# Patient Record
Sex: Male | Born: 1940 | Race: White | Hispanic: No | Marital: Married | Smoking: Former smoker
Health system: Southern US, Community
[De-identification: ages and names within clinical notes are randomized; demographics above are authoritative.]

## PROBLEM LIST (undated history)

## (undated) DIAGNOSIS — M1990 Unspecified osteoarthritis, unspecified site: Secondary | ICD-10-CM

## (undated) DIAGNOSIS — Z800 Family history of malignant neoplasm of digestive organs: Secondary | ICD-10-CM

## (undated) DIAGNOSIS — E785 Hyperlipidemia, unspecified: Secondary | ICD-10-CM

## (undated) DIAGNOSIS — N200 Calculus of kidney: Secondary | ICD-10-CM

## (undated) DIAGNOSIS — I493 Ventricular premature depolarization: Secondary | ICD-10-CM

## (undated) DIAGNOSIS — J45909 Unspecified asthma, uncomplicated: Secondary | ICD-10-CM

## (undated) DIAGNOSIS — D751 Secondary polycythemia: Secondary | ICD-10-CM

## (undated) DIAGNOSIS — Z951 Presence of aortocoronary bypass graft: Secondary | ICD-10-CM

## (undated) DIAGNOSIS — I2510 Atherosclerotic heart disease of native coronary artery without angina pectoris: Secondary | ICD-10-CM

## (undated) DIAGNOSIS — R55 Syncope and collapse: Secondary | ICD-10-CM

## (undated) DIAGNOSIS — I25810 Atherosclerosis of coronary artery bypass graft(s) without angina pectoris: Secondary | ICD-10-CM

## (undated) DIAGNOSIS — G8929 Other chronic pain: Secondary | ICD-10-CM

## (undated) DIAGNOSIS — Z950 Presence of cardiac pacemaker: Secondary | ICD-10-CM

## (undated) DIAGNOSIS — I219 Acute myocardial infarction, unspecified: Secondary | ICD-10-CM

## (undated) DIAGNOSIS — K5790 Diverticulosis of intestine, part unspecified, without perforation or abscess without bleeding: Secondary | ICD-10-CM

## (undated) DIAGNOSIS — I10 Essential (primary) hypertension: Secondary | ICD-10-CM

## (undated) DIAGNOSIS — I672 Cerebral atherosclerosis: Secondary | ICD-10-CM

## (undated) DIAGNOSIS — I420 Dilated cardiomyopathy: Secondary | ICD-10-CM

## (undated) DIAGNOSIS — M549 Dorsalgia, unspecified: Secondary | ICD-10-CM

## (undated) DIAGNOSIS — Z9861 Coronary angioplasty status: Secondary | ICD-10-CM

## (undated) DIAGNOSIS — K219 Gastro-esophageal reflux disease without esophagitis: Secondary | ICD-10-CM

## (undated) DIAGNOSIS — N529 Male erectile dysfunction, unspecified: Secondary | ICD-10-CM

## (undated) HISTORY — DX: Coronary angioplasty status: Z98.61

## (undated) HISTORY — DX: Essential (primary) hypertension: I10

## (undated) HISTORY — DX: Ventricular premature depolarization: I49.3

## (undated) HISTORY — DX: Secondary polycythemia: D75.1

## (undated) HISTORY — DX: Atherosclerosis of coronary artery bypass graft(s) without angina pectoris: I25.810

## (undated) HISTORY — PX: CORONARY ANGIOPLASTY WITH STENT PLACEMENT: SHX49

## (undated) HISTORY — PX: CATARACT EXTRACTION W/ INTRAOCULAR LENS  IMPLANT, BILATERAL: SHX1307

## (undated) HISTORY — PX: TRANSTHORACIC ECHOCARDIOGRAM: SHX275

## (undated) HISTORY — DX: Unspecified asthma, uncomplicated: J45.909

## (undated) HISTORY — DX: Atherosclerotic heart disease of native coronary artery without angina pectoris: I25.10

## (undated) HISTORY — DX: Family history of malignant neoplasm of digestive organs: Z80.0

## (undated) HISTORY — DX: Cerebral atherosclerosis: I67.2

## (undated) HISTORY — DX: Syncope and collapse: R55

## (undated) HISTORY — DX: Diverticulosis of intestine, part unspecified, without perforation or abscess without bleeding: K57.90

## (undated) HISTORY — DX: Gastro-esophageal reflux disease without esophagitis: K21.9

## (undated) HISTORY — PX: TRANSURETHRAL RESECTION OF PROSTATE: SHX73

## (undated) HISTORY — DX: Male erectile dysfunction, unspecified: N52.9

## (undated) HISTORY — DX: Hyperlipidemia, unspecified: E78.5

## (undated) HISTORY — DX: Presence of aortocoronary bypass graft: Z95.1

---
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
## 2000-04-21 ENCOUNTER — Encounter: Payer: Self-pay | Admitting: Pulmonary Disease

## 2000-07-21 ENCOUNTER — Other Ambulatory Visit: Admission: RE | Admit: 2000-07-21 | Discharge: 2000-07-21 | Payer: Self-pay | Admitting: *Deleted

## 2000-07-21 ENCOUNTER — Encounter (INDEPENDENT_AMBULATORY_CARE_PROVIDER_SITE_OTHER): Payer: Self-pay | Admitting: Specialist

## 2001-01-11 ENCOUNTER — Encounter: Payer: Self-pay | Admitting: Pulmonary Disease

## 2001-02-27 ENCOUNTER — Encounter: Payer: Self-pay | Admitting: Pulmonary Disease

## 2001-04-16 ENCOUNTER — Encounter: Payer: Self-pay | Admitting: Pulmonary Disease

## 2001-04-25 ENCOUNTER — Ambulatory Visit (HOSPITAL_COMMUNITY): Admission: RE | Admit: 2001-04-25 | Discharge: 2001-04-25 | Payer: Self-pay | Admitting: Family Medicine

## 2001-04-25 ENCOUNTER — Encounter: Payer: Self-pay | Admitting: Family Medicine

## 2001-05-11 ENCOUNTER — Encounter: Payer: Self-pay | Admitting: Pulmonary Disease

## 2001-10-12 ENCOUNTER — Inpatient Hospital Stay (HOSPITAL_COMMUNITY): Admission: RE | Admit: 2001-10-12 | Discharge: 2001-10-14 | Payer: Self-pay | Admitting: Urology

## 2001-10-12 ENCOUNTER — Encounter (INDEPENDENT_AMBULATORY_CARE_PROVIDER_SITE_OTHER): Payer: Self-pay | Admitting: Specialist

## 2002-11-01 ENCOUNTER — Encounter (INDEPENDENT_AMBULATORY_CARE_PROVIDER_SITE_OTHER): Payer: Self-pay | Admitting: *Deleted

## 2002-11-01 ENCOUNTER — Emergency Department (HOSPITAL_COMMUNITY): Admission: EM | Admit: 2002-11-01 | Discharge: 2002-11-01 | Payer: Self-pay

## 2003-09-06 DIAGNOSIS — I219 Acute myocardial infarction, unspecified: Secondary | ICD-10-CM

## 2003-09-06 HISTORY — DX: Acute myocardial infarction, unspecified: I21.9

## 2004-07-06 DIAGNOSIS — Z951 Presence of aortocoronary bypass graft: Secondary | ICD-10-CM

## 2004-07-06 HISTORY — DX: Presence of aortocoronary bypass graft: Z95.1

## 2004-07-15 HISTORY — PX: CARDIAC CATHETERIZATION: SHX172

## 2004-07-23 ENCOUNTER — Inpatient Hospital Stay (HOSPITAL_COMMUNITY)
Admission: RE | Admit: 2004-07-23 | Discharge: 2004-07-28 | Payer: Self-pay | Admitting: Thoracic Surgery (Cardiothoracic Vascular Surgery)

## 2004-07-23 HISTORY — PX: CORONARY ARTERY BYPASS GRAFT: SHX141

## 2004-08-23 ENCOUNTER — Encounter
Admission: RE | Admit: 2004-08-23 | Discharge: 2004-08-23 | Payer: Self-pay | Admitting: Thoracic Surgery (Cardiothoracic Vascular Surgery)

## 2004-09-08 ENCOUNTER — Encounter (HOSPITAL_COMMUNITY): Admission: RE | Admit: 2004-09-08 | Discharge: 2004-10-08 | Payer: Self-pay | Admitting: Cardiovascular Disease

## 2004-09-23 ENCOUNTER — Ambulatory Visit: Payer: Self-pay | Admitting: Pulmonary Disease

## 2004-10-11 ENCOUNTER — Encounter (HOSPITAL_COMMUNITY): Admission: RE | Admit: 2004-10-11 | Discharge: 2004-11-10 | Payer: Self-pay | Admitting: Cardiovascular Disease

## 2005-03-14 ENCOUNTER — Encounter: Admission: RE | Admit: 2005-03-14 | Discharge: 2005-03-14 | Payer: Self-pay

## 2006-04-05 ENCOUNTER — Ambulatory Visit: Payer: Self-pay | Admitting: Pulmonary Disease

## 2006-05-15 ENCOUNTER — Ambulatory Visit: Payer: Self-pay | Admitting: Internal Medicine

## 2006-05-30 ENCOUNTER — Ambulatory Visit: Payer: Self-pay | Admitting: Internal Medicine

## 2006-07-25 ENCOUNTER — Encounter: Admission: RE | Admit: 2006-07-25 | Discharge: 2006-07-25 | Payer: Self-pay | Admitting: Cardiovascular Disease

## 2008-07-09 DIAGNOSIS — N401 Enlarged prostate with lower urinary tract symptoms: Secondary | ICD-10-CM

## 2008-07-09 DIAGNOSIS — J309 Allergic rhinitis, unspecified: Secondary | ICD-10-CM | POA: Insufficient documentation

## 2008-07-09 DIAGNOSIS — G4733 Obstructive sleep apnea (adult) (pediatric): Secondary | ICD-10-CM | POA: Insufficient documentation

## 2008-07-09 DIAGNOSIS — N138 Other obstructive and reflux uropathy: Secondary | ICD-10-CM | POA: Insufficient documentation

## 2008-07-10 ENCOUNTER — Ambulatory Visit: Payer: Self-pay | Admitting: Pulmonary Disease

## 2008-09-05 DIAGNOSIS — R55 Syncope and collapse: Secondary | ICD-10-CM

## 2008-09-05 HISTORY — DX: Syncope and collapse: R55

## 2009-07-15 ENCOUNTER — Ambulatory Visit: Payer: Self-pay | Admitting: Pulmonary Disease

## 2009-08-12 ENCOUNTER — Ambulatory Visit: Payer: Self-pay | Admitting: Internal Medicine

## 2009-08-12 DIAGNOSIS — K219 Gastro-esophageal reflux disease without esophagitis: Secondary | ICD-10-CM

## 2009-08-12 DIAGNOSIS — R1310 Dysphagia, unspecified: Secondary | ICD-10-CM | POA: Insufficient documentation

## 2009-08-21 ENCOUNTER — Ambulatory Visit: Payer: Self-pay | Admitting: Internal Medicine

## 2009-08-25 ENCOUNTER — Ambulatory Visit: Payer: Self-pay | Admitting: Internal Medicine

## 2009-08-25 LAB — CONVERTED CEMR LAB: UREASE: POSITIVE

## 2009-09-28 ENCOUNTER — Ambulatory Visit: Payer: Self-pay | Admitting: Internal Medicine

## 2009-09-28 DIAGNOSIS — K2980 Duodenitis without bleeding: Secondary | ICD-10-CM | POA: Insufficient documentation

## 2010-07-14 ENCOUNTER — Ambulatory Visit: Payer: Self-pay | Admitting: Pulmonary Disease

## 2010-07-14 DIAGNOSIS — J45901 Unspecified asthma with (acute) exacerbation: Secondary | ICD-10-CM

## 2010-08-04 ENCOUNTER — Ambulatory Visit: Payer: Self-pay | Admitting: Oncology

## 2010-08-18 ENCOUNTER — Encounter: Payer: Self-pay | Admitting: Pulmonary Disease

## 2010-08-18 LAB — CBC WITH DIFFERENTIAL/PLATELET
BASO%: 0.3 % (ref 0.0–2.0)
EOS%: 4.4 % (ref 0.0–7.0)
HCT: 52.2 % — ABNORMAL HIGH (ref 38.4–49.9)
MCH: 29.5 pg (ref 27.2–33.4)
MCHC: 33.3 g/dL (ref 32.0–36.0)
MCV: 88.8 fL (ref 79.3–98.0)
MONO%: 6.5 % (ref 0.0–14.0)
NEUT%: 76.2 % — ABNORMAL HIGH (ref 39.0–75.0)
lymph#: 1.2 10*3/uL (ref 0.9–3.3)

## 2010-08-19 LAB — IRON AND TIBC
%SAT: 35 % (ref 20–55)
Iron: 118 ug/dL (ref 42–165)
TIBC: 335 ug/dL (ref 215–435)
UIBC: 217 ug/dL

## 2010-08-19 LAB — COMPREHENSIVE METABOLIC PANEL
ALT: 23 U/L (ref 0–53)
CO2: 28 mEq/L (ref 19–32)
Creatinine, Ser: 1.07 mg/dL (ref 0.40–1.50)
Glucose, Bld: 78 mg/dL (ref 70–99)
Total Bilirubin: 0.7 mg/dL (ref 0.3–1.2)

## 2010-08-19 LAB — FERRITIN: Ferritin: 62 ng/mL (ref 22–322)

## 2010-09-10 ENCOUNTER — Ambulatory Visit
Admission: RE | Admit: 2010-09-10 | Discharge: 2010-09-10 | Payer: Self-pay | Source: Home / Self Care | Attending: Thoracic Surgery (Cardiothoracic Vascular Surgery) | Admitting: Thoracic Surgery (Cardiothoracic Vascular Surgery)

## 2010-09-10 ENCOUNTER — Encounter
Admission: RE | Admit: 2010-09-10 | Discharge: 2010-09-10 | Payer: Self-pay | Source: Home / Self Care | Attending: Thoracic Surgery (Cardiothoracic Vascular Surgery) | Admitting: Thoracic Surgery (Cardiothoracic Vascular Surgery)

## 2010-10-05 NOTE — Assessment & Plan Note (Signed)
Summary: FOLLOWUP EGD / H. pylori   History of Present Illness Visit Type: Follow-up Visit Primary GI MD: Yancey Flemings MD Primary Provider: Dwana Melena, MD Requesting Provider: n/a Chief Complaint: follow up EGD, Pt is doing well and has no GI complaints today History of Present Illness:   70 year old white male with multiple medical problems including coronary artery disease status post CABG, hyperlipidemia, asthma, and arthritis. He was evaluated in the office August 12, 2009 for vague dysphagia and GERD. On December 17 he underwent upper endoscopy. The esophagus was entirely normal. The stomach was normal. The duodenum revealed duodenitis. Biopsy testing for Helicobacter pylori return positive. He was treated with Prevpac x2 weeks. He completed therapy. He returns today for followup. He is absolutely no GI complaints. He remains on Dexilant. He does undergo periodic surveillance colonoscopy because of a family history of colon cancer. His last exam in 2007 revealed diverticulosis and internal hemorrhoids only.   GI Review of Systems    Reports acid reflux.      Denies abdominal pain, belching, bloating, chest pain, dysphagia with liquids, dysphagia with solids, heartburn, loss of appetite, nausea, vomiting, vomiting blood, weight loss, and  weight gain.      Reports diverticulosis.     Denies anal fissure, black tarry stools, change in bowel habit, constipation, diarrhea, fecal incontinence, heme positive stool, hemorrhoids, irritable bowel syndrome, jaundice, light color stool, liver problems, rectal bleeding, and  rectal pain.    Current Medications (verified): 1)  Nasonex 50 Mcg/act  Susp (Mometasone Furoate) .... 2 Sprays in Each Nostril 1 To 2 Times Daily 2)  Allopurinol 300 Mg Tabs (Allopurinol) .... Take 1 Tablet By Mouth Once A Day 3)  Aspirin Low Dose 81 Mg Tabs (Aspirin) .... Take 1 Tablet By Mouth Once A Day 4)  Vytorin 10-40 Mg Tabs (Ezetimibe-Simvastatin) .... Take 1 Tablet By  Mouth Once A Day 5)  Singulair 10 Mg  Tabs (Montelukast Sodium) .... One By Mouth Daily 6)  Dexilant 60 Mg Cpdr (Dexlansoprazole) .... Take One Qd  Allergies (verified): 1)  ! Penicillin 2)  ! Demerol  Past History:  Past Medical History: Reviewed history from 08/11/2009 and no changes required. Hyperlipidemia Allergic Rhinitis Asthma CAD Family hx of colon cancer   Past Surgical History: Reviewed history from 08/12/2009 and no changes required. heart bypass 05  Family History: Reviewed history from 08/12/2009 and no changes required. heart disease: father, brother cancer: brother (colon) sister allergies: mother, father, children  Social History: Reviewed history from 08/12/2009 and no changes required. Patient states former smoker.  Patient is a former smoker.  quit 1965 Alcohol Use - no Daily Caffeine Use  diet sodas Illicit Drug Use - no married Occupation:  [employer]  Review of Systems       negative review of systems  Vital Signs:  Patient profile:   70 year old male Height:      68 inches Weight:      177 pounds BMI:     27.01 Pulse rate:   80 / minute Pulse rhythm:   regular BP sitting:   120 / 70  (left arm) Cuff size:   regular  Vitals Entered By: Francee Piccolo CMA Duncan Dull) (September 28, 2009 1:57 PM)  Physical Exam  General:  Well developed, well nourished, no acute distress. Head:  Normocephalic and atraumatic. Eyes:  PERRLA, no icterus. Mouth:  No deformity or lesions, Lungs:  Clear throughout to auscultation. Heart:  Regular rate and rhythm; no murmurs, rubs,  or bruits. Abdomen:  Soft, nontender and nondistended. No masses, hepatosplenomegaly or hernias noted. Normal bowel sounds. Pulses:  Normal pulses noted. Extremities:  no edema Neurologic:  Alert and  oriented x4;   Skin:  Intact without significant lesions or rashes. Psych:  Alert and cooperative. Normal mood and affect.   Impression & Recommendations:  Problem # 1:   DUODENITIS (ICD-535.60) duodenitis on endoscopy. If Helicobacter pylori positive. Status post eradication therapy with Prevpac completed. The further evaluation and therapy indicated at present  Problem # 2:  GERD (ICD-530.81) GERD. Recent therapy with Dexilant initiated. The patient's symptoms have resolved.  Plan: #1. Continue Dexilant #2. Reflux precautions  #3. GI followup p.r.n. Resume general medical care with primary provider  Problem # 3:  FAMILY HX COLON CANCER (ICD-V16.0) family history of colon cancer. Last colonoscopy in 2007 as described. Due for followup surveillance in 2012. Patient aware.  Problem # 4:  DYSPHAGIA UNSPECIFIED (ICD-787.20) previous complaints of vague dysphagia. Negative endoscopy. Currently asymptomatic on excellent. Expected management  Patient Instructions: 1)  copy: Dr. Dwana Melena

## 2010-10-05 NOTE — Assessment & Plan Note (Signed)
Summary: acute sick visit for asthma exacerbation.   Visit Type:  Follow-up Copy to:  n/a Primary Provider/Referring Provider:  Dwana Melena, MD  CC:  1 year follow up. Pt states x 3 weeks he has had a little breathing problems. pt c/o cough with dark green phlem in the am, little chest tightness and congestion, ocass wheezing, and post nasal drip. pt needs rx for singulair. pt quit smoking 1965.  History of Present Illness: the pt comes in today for f/u of his asthma.  He is maintained on singulair due to his desire to stay off inhaled medications, and overall has done well on this treatment.  However, he frequently has increased symptoms with the change in weather. He comes in today with a 3 week h/o mild increase in sob, along with chest tightness and congestion.  He does not feel that he has a chest cold, but does have some discolored mucus first thing in the am.  Current Medications (verified): 1)  Nasonex 50 Mcg/act  Susp (Mometasone Furoate) .... 2 Sprays in Each Nostril 1 To 2 Times Daily 2)  Allopurinol 300 Mg Tabs (Allopurinol) .... Take 1 Tablet By Mouth Once A Day 3)  Aspirin Low Dose 81 Mg Tabs (Aspirin) .... Take 1 Tablet By Mouth Once A Day 4)  Vytorin 10-40 Mg Tabs (Ezetimibe-Simvastatin) .... Take 1/2  Tablet By Mouth Once A Day 5)  Singulair 10 Mg  Tabs (Montelukast Sodium) .... One By Mouth Daily 6)  Dexilant 60 Mg Cpdr (Dexlansoprazole) .... Take One Capsule By Mouth Once Daily  Allergies (verified): 1)  ! Penicillin 2)  ! Demerol  Past History:  Past medical, surgical, family and social histories (including risk factors) reviewed, and no changes noted (except as noted below).  Past Medical History: Reviewed history from 08/11/2009 and no changes required. Hyperlipidemia Allergic Rhinitis Asthma CAD Family hx of colon cancer   Past Surgical History: Reviewed history from 08/12/2009 and no changes required. heart bypass 05  Family History: Reviewed history  from 08/12/2009 and no changes required. heart disease: father, brother cancer: brother (colon) sister allergies: mother, father, children  Social History: Reviewed history from 08/12/2009 and no changes required. Patient states former smoker.  Patient is a former smoker.  quit 1965 Alcohol Use - no Daily Caffeine Use  diet sodas Illicit Drug Use - no married Occupation:  [employer]  Review of Systems       The patient complains of shortness of breath with activity, productive cough, non-productive cough, nasal congestion/difficulty breathing through nose, and ear ache.  The patient denies shortness of breath at rest, coughing up blood, chest pain, irregular heartbeats, acid heartburn, indigestion, loss of appetite, weight change, abdominal pain, difficulty swallowing, sore throat, tooth/dental problems, headaches, sneezing, itching, anxiety, depression, hand/feet swelling, joint stiffness or pain, rash, change in color of mucus, and fever.    Vital Signs:  Patient profile:   70 year old male Height:      68 inches Weight:      180 pounds BMI:     27.47 O2 Sat:      92 % on Room air Temp:     98.2 degrees F oral Pulse rate:   87 / minute BP sitting:   112 / 66  (left arm) Cuff size:   regular  Vitals Entered By: Carver Fila (July 14, 2010 1:50 PM)  O2 Flow:  Room air CC: 1 year follow up. Pt states x 3 weeks he has had a  little breathing problems. pt c/o cough with dark green phlem in the am, little chest tightness and congestion, ocass wheezing, post nasal drip. pt needs rx for singulair. pt quit smoking 1965 Comments meds and allergies updated Phone number updated Mindy Silva  July 14, 2010 1:50 PM    Physical Exam  General:  wd male in nad Ears:  left TM ok, canal with cerumen Nose:  patent without discharge, no purulence seen Mouth:  clear without exudates. Lungs:  clear to auscultation, but mildly decreased bs. Heart:  rrr, no mrg Extremities:  soft and  nontender, bs+ Neurologic:  alert and oriented, moves all 4.   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  Will treat with a short course of prednisone to get him thru this.  I have wanted to get him on chronic ICS instead of singulair, but the pt would like to avoid.  Will see him back in one year if he does well, and he is to call if he changes his mind about ICS.  Medications Added to Medication List This Visit: 1)  Vytorin 10-40 Mg Tabs (Ezetimibe-simvastatin) .... Take 1/2  tablet by mouth once a day 2)  Prednisone 10 Mg Tabs (Prednisone) .... Take 4 each day for 2 days, then 3 each day for 2 days, then 2 each day for 2 days, then 1 each day for 2 days, then stop  Other Orders: Est. Patient Level IV (16109)  Patient Instructions: 1)  will treat with a quick course of prednisone to get you thru this episode 2)  stay on singulair 3)  followup with me in one year.  Prescriptions: PREDNISONE 10 MG  TABS (PREDNISONE) take 4 each day for 2 days, then 3 each day for 2 days, then 2 each day for 2 days, then 1 each day for 2 days, then stop  #20 x 0   Entered and Authorized by:   Barbaraann Share MD   Signed by:   Barbaraann Share MD on 07/14/2010   Method used:   Print then Give to Patient   RxID:   [id number] SINGULAIR 10 MG  TABS (MONTELUKAST SODIUM) One by mouth daily  #90 x 4   Entered and Authorized by:   Barbaraann Share MD   Signed by:   Barbaraann Share MD on 07/14/2010   Method used:   Print then Give to Patient   RxID:   [id number]    Immunization History:  Influenza Immunization History:    Influenza:  historical (06/19/2010)  Pneumovax Immunization History:    Pneumovax:  historical (06/07/2007)

## 2010-10-19 ENCOUNTER — Encounter (HOSPITAL_BASED_OUTPATIENT_CLINIC_OR_DEPARTMENT_OTHER): Payer: 59 | Admitting: Oncology

## 2010-10-19 DIAGNOSIS — D751 Secondary polycythemia: Secondary | ICD-10-CM

## 2010-11-16 NOTE — Letter (Signed)
Summary: [facility]  [facility]   Imported By: Sherian Rein 09/01/2010 07:34:10  _____________________________________________________________________  External Attachment:    Type:   Image     Comment:   External Document

## 2011-01-18 NOTE — Assessment & Plan Note (Signed)
OFFICE VISIT   [name], [name]  DOB:  [birth year]                                        September 10, 2010  CHART #:  [id number]   HISTORY OF PRESENT ILLNESS:  The patient is [name] 59-year year-old gentleman  who underwent coronary artery bypass grafting x4 in November 2005, for  severe three-vessel coronary artery disease.  He did well  postoperatively and has not had any further cardiac problems at all  since then.  He has been followed by Dr. Susa Griffins in the office  and apparently underwent [name] stress Cardiolite exam in 2009, that was felt  to be low risk.  In November 2010, he had [name] 2-D echocardiogram that  reportedly revealed normal left ventricular function.  Since then the  patient has continued to do well clinically.  He remains quite active  physically, exercising at least three times each week.  When he goes, he  walks for at least [name] mile and he walks at [name] brisk pace.  He also has  done some weight lifting and working out in [employer]  He denies any  exertional chest pain.  He denies any exertional shortness of breath.  He states that when he was working out in the gym over [name] year ago using  dumbbell presses on [name] bench, he developed some pain anteriorly in the  left of his chest.  He has still had pain off and on in this location.  It seems to be reproduced by pressing on the costal cartilage just the  left side of the sternum.  This pain is clearly positional and  reproduced with palpation.  He denies any sensation of motion or  clicking in this area.  He has not had any fevers or chills.  He  otherwise feels quite well and has no other complaints.  The remainder  of his past medical history and review of systems are both unremarkable.   CURRENT MEDICATIONS:  Include allopurinol, aspirin, Vytorin, Singulair,  albuterol inhaler as needed, Protonix, and testosterone injections.   PHYSICAL EXAMINATION:  General/Vital Signs:  Notable for well-appearing  male with blood pressure 140/80, pulse 72, oxygen saturation 96% on room  air.  HEENT:  Unrevealing.  Chest:  Examination of the chest reveals [name]  completely healed sternal scar.  The sternum is stable on palpation and  nontender.  On palpation along the left anterior costal cartilages, one  can reproduce some mild discomfort without any sensation of motion or  instability overlying the fourth or fifth intercostal cartilage.  The  patient states that this reproduces his pain.  Auscultation of the chest  reveals clear breath sounds that are symmetrical bilaterally.  No  wheezes, rales, or rhonchi are noted. Cardiovascular:  Notable for  regular rate and rhythm.  No murmurs, rubs, or gallops noted.  Abdomen:  Soft, nontender.  Extremities:  Warm and well perfused.  The remainder  of his physical exam is unrevealing.   DIAGNOSTIC TEST:  Chest x-ray performed today at the [facility] is reviewed.  This demonstrates clear lung fields bilaterally.  All the sternal wires appear intact.  There is no sign of any sternal  fracture or dislocation of the costal cartilage.   IMPRESSION:  Atypical chest pain that clearly seems to be  musculoskeletal in origin and exacerbated  with specific motions or use  of bench press or dumbbells.  The pain seems to be related to costal  cartilages and may represent costochondritis.  The patient seems to be  quite active physically otherwise and does not have any other exertional  symptoms worrisome for the possibility of recurrent angina pectoris.  However, it should be noted that prior to his bypass surgery in 2005,  his only symptoms for mild exertional shortness of breath and he had  critical three-vessel coronary artery disease discovered following  catheterization, despite low-risk stress Cardiolite exam.   PLAN:  I have discussed matters at length with the patient here in the  office today for more than 40 minutes.  I explained the fact that   although stress tests are not perfect exams, they represent reasonably  good screening tool for followup of patients with coronary artery  disease.  I also explained that the only way to know for certain that he  did not have significant coronary artery blockages, would require repeat  catheterization.  High-resolution cardiac CT imaging could be utilized  as well, although at this point the patient is doing quite well  clinically and does not describe any symptoms that sound worrisome for  the presence of recurrent coronary artery disease.  With respect to his  atypical chest pain, this does seem to be musculoskeletal in nature.  I  have suggested that he should continue to avoid any heavy lifting or  strenuous use of his arms and shoulder that might exacerbate his pain  and use over-the-counter nonsteroidal medications to alleviate any  associated inflammation.  He is greatly reassured by this conversation.  In the future, he will call or return to see [place] as needed.  All of his  questions have been addressed.   [name], M.D.  Electronically Signed   CHO/MEDQ  D:  09/10/2010  T:  09/10/2010  Job:  [id number]   cc:   [name], M.D.  [name], M.D.

## 2011-01-21 NOTE — H&P (Signed)
[facility]  Patient:    [name], [name] Visit Number: [id number] MRN: [id number]          Service Type: SUR Location: 1S X005 01 Attending Physician:  Trisha Mangle Dictated by:   Veverly Fells Vernie Ammons, M.D. Admit Date:  10/12/2001   CC:         Luciana Axe, M.D.   History and Physical  HISTORY OF PRESENT ILLNESS:  This 70 year old white male patient was seen back in June of last year for an enlarged prostate.  He had had relatively stable PSAs and a history of prostatitis in the 1990s.  For the two years prior to seeing me he had had a little difficulty with ejaculation not feeling quite right.  He said last November he had some sinus surgery and nearly went into retention.  Since then he was placed on Flomax 0.4 mg, and that had helped his voiding.  He continued to have some frequency and nocturia as well as mild hesitancy and feeling of incomplete emptying.  He was found by exam to have a 2 to 3+ enlarged prostate that was smooth and benign-feeling.  His PSA at that time was 1.38.  I increased his Flomax to 0.8 mg, but he was unable to tolerate this increased dosage.  He continued to have obstructive symptoms, and we discussed prostate resection.  He has elected to proceed with that and is admitted electively at this time.  PAST MEDICAL HISTORY: 1. Asthma. 2. Some sinus trouble requiring surgery in the past.  PAST SURGICAL HISTORY:  Sinus surgery.  ADMISSION MEDICATIONS: 1. Flomax 0.4 mg q.d. 2. Serevent 1 puff b.i.d. 3. Pulmicort 2 puffs b.i.d. 4. Albuterol 2 puffs b.i.d.  ALLERGIES:  PENICILLIN causes a headache, and DEMEROL causes diaphoresis.  SOCIAL HISTORY:  He smoked a pack of cigarettes a day for seven years, quit in 1965.  Drinks little alcohol.  FAMILY HISTORY:  Father died at 29.  Mother is alive at age 62.  There is heart disease, hypertension, and kidney stones in his family.  REVIEW OF SYSTEMS:  Per health history  section, which was reviewed and signed.  PHYSICAL EXAMINATION:  VITAL SIGNS:  Blood pressure 140/84, pulse 84 and regular, respirations 14, temperature 98.7.  Weight 190 pounds.  GENERAL:  Well-developed, well-nourished white male in no apparent distress.  HEENT:  Atraumatic, normocephalic.  PERRLA.  EOMI.  Oropharynx is clear.  NECK:  Supple.  Without adenopathy or JVD.  LUNGS:  Clear.  CARDIOVASCULAR:  Regular rate and rhythm.  ABDOMEN:  Benign.  Free of mass, tenderness, or hepatosplenomegaly.  No inguinal hernias or adenopathy are noted.  GENITOURINARY:  Normal uncircumcised phallus, without lesions or discharge. Normal glans meatus, scrotum, testicles, epididymis, anus, and perineum.  RECTAL:  Normal rectal tone.  Prostate is 2 to 3+ enlarged, smooth, symmetric. Has no palpable seminal vesicle abnormality, and the previously noted nodule is nonpalpable today.  EXTREMITIES:  Without clubbing, cyanosis, or edema.  NEUROLOGIC:  No gross focal neurologic deficits.  SKIN:  Warm and dry.  LABORATORY DATA:  Hemoglobin 15.3, hematocrit 44.3.  Chest x-ray reveals no acute cardiopulmonary process.  EKG reveals normal sinus rhythm, frequent PVCs, but no ST or T-wave changes.  IMPRESSION: 1. 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 2. Asthma. 3. Question of possible sleep apnea by history.  PLAN: 1. Perioperative antibiotics. 2. Routine DVT prophylaxis. 3. TURP. Dictated by:   Veverly Fells Vernie Ammons, M.D. Attending Physician:  Trisha Mangle  DD:  10/12/01 TD:  10/12/01 Job: [id number]

## 2011-01-21 NOTE — Op Note (Signed)
NAME:  [name], [name]                   ACCOUNT NO.:  [id number]   MEDICAL RECORD NO.:  [id number]          PATIENT TYPE:  INP   LOCATION:  2307                         FACILITY:  [facility]   PHYSICIAN:  Salvatore Decent. Cornelius Moras, M.D. DATE OF BIRTH:  1940/11/23   DATE OF PROCEDURE:  07/23/2004  DATE OF DISCHARGE:                                 OPERATIVE REPORT   PREOPERATIVE DIAGNOSIS:  Severe three-vessel coronary artery disease.   POSTOPERATIVE DIAGNOSIS:  Severe three-vessel coronary artery disease.   PROCEDURE:  Median sternotomy for coronary artery bypass grafting x4 (left  internal mammary artery to circumflex marginal branch, right internal  mammary artery to distal left anterior descending coronary artery, saphenous  vein graft to first diagonal branch, saphenous vein graft to posterior  coronary artery, endoscopic saphenous vein harvest from right thigh).   SURGEON:  Salvatore Decent. Cornelius Moras, M.D.   ASSISTANT:  Eber Jones A. Eustaquio Boyden.   ANESTHESIA:  General.   BRIEF CLINICAL NOTE:  The patient is a 70 year old gentleman from  [place], who is followed by Patrica Duel, M.D., and referred by Pearletha Furl. Alanda Amass, M.D., for management of coronary artery disease.  The patient  presents with exertional shortness of breath and fatigue.  A stress  Cardiolite exam was performed and was abnormal.  The patient subsequently  underwent cardiac catheterization demonstrating severe three-vessel coronary  artery disease with preserved left ventricular function.  A full  consultation note has been dictated previously.  The patient and his wife  understand and accept all associated risks of surgery and desire to proceed  as described.   OPERATIVE NOTE IN DETAIL:  The patient is brought to the operating room on  the above-mentioned date and central monitoring is established by the  anesthesia service under the care and direction of Maren Beach, M.D.  Specifically, a Swan-Ganz catheter is placed  through the right internal  jugular approach.  A radial arterial line is placed.  Intravenous  antibiotics are administered.  Following induction with general endotracheal  anesthesia, a Foley catheter is placed.  The patient's chest, abdomen, both  groins, and both lower extremities are prepared and draped in a sterile  manner.   A median sternotomy incision is performed and the left internal mammary  artery is dissected from the chest wall and prepared for bypass grafting.  The left internal mammary artery is good-quality conduit.  Following this,  the right internal mammary artery is also dissected from the chest wall and  prepared for bypass grafting.  This vessel is also good-quality conduit.  Simultaneously, saphenous vein is obtained from the patient's right thigh  using endoscopic vein harvest technique.  The saphenous vein is good-quality  conduit.  After the saphenous vein is removed from the thigh, the small  surgical incision just above the knee is closed in multiple layers with  running absorbable suture.  The patient is heparinized systemically.   The pericardium is opened.  The ascending aorta is normal in appearance, in  fact is relatively small-caliber.  The heart is relatively small-sized as  well and  otherwise normal in appearance.  The ascending aorta and the right  atrium are cannulated for cardiopulmonary bypass.  Adequate heparinization  is verified.  Cardiopulmonary bypass is begun.  The surface of the heart is  inspected.  The epicardial coronary arteries are all relatively small-  caliber.  The distal right coronary artery is diffusely diseased.  The  posterolateral branch off the distal right coronary artery is felt to be too  small for bypass grafting.  All of the diagonal branches off the left  anterior descending coronary artery are relatively small as well, although  the first diagonal branch appears to be sufficient size for grafting.  The  circumflex  marginal branch is diffusely diseased proximally but appears to  be good-quality target distally.  The left anterior descending coronary  artery is intramyocardial during a good portion of its course down the  anterior wall.  A temperature probe is placed in the left ventricular  septum.  A cardioplegia catheter is placed in the ascending aorta.   The patient is cooled to 32 degrees systemic temperature.  The aortic  crossclamp is applied and cardioplegia is delivered initially in an  antegrade fashion through the aortic root.  Iced saline slush is applied for  topical hypothermia.  The initial cardioplegic arrest and myocardial cooling  are felt to be excellent.  Repeat doses of cardioplegia are administered  intermittently throughout the crossclamp portion of the operation both  through the aortic root and down the subsequently-placed vein grafts to  maintain septal temperature below 15 degrees Centigrade.  The following  distal coronary anastomoses are performed:  (1) The posterior descending  coronary artery is grafted with a saphenous vein graft in an end-to-side  fashion.  This coronary measures 1.3 mm in diameter at the site of distal  bypass.  It is a fair-quality target at best and, in fact, is relatively  poor-quality proximally.  At the level of the bifurcation of the distal  right coronary artery, there is diffuse plaque.  The posterolateral branch  off the distal right coronary artery is too small for grafting.  (2) The  first diagonal branch off the left anterior descending coronary artery is  grafted with a saphenous vein graft in an end-to-side fashion.  This vessel  is a bifurcated vessel.  In the medial sub-branches, the graft is the branch  which is grafted.  At the site of distal bypass, it measures 1.0 mm in  diameter and is of fair quality.  (3) The circumflex marginal branch is grafted with the left internal mammary artery in an end-to-side fashion.  This coronary  measures 1.5 mm in diameter at the site of distal bypass and  is of good quality.  (4) The distal left anterior descending coronary artery  is grafted with the right internal mammary artery in an end-to-side fashion.  The right internal mammary artery is utilized in situ with the pedicle of  the graft traversing anterior to the ascending aorta.  At the site of distal  bypass, the left anterior descending coronary artery measures 1.4 mm in  diameter.  It is intramyocardial proximally.  It is good-quality at the site  of distal bypass.   Both proximal saphenous vein anastomoses are performed directly to the  ascending aorta prior to removal of the aortic crossclamp.  The left  ventricular septal temperature is noted to rise rapidly following  reperfusion of the left and right internal mammary arteries.  The heart  begins to beat  spontaneously.  The aortic crossclamp is removed after a  total crossclamp time of 111 minutes.   Normal sinus rhythm resumes spontaneously.  All proximal and distal coronary  anastomoses are inspected for hemostasis and appropriate graft orientation.  Epicardial pacing wires are affixed to the right ventricular outflow tract  and to the right atrial appendage.  The patient is rewarmed to 37 degrees  Centigrade temperature.  The patient is weaned from cardiopulmonary bypass  without difficulty.  The patient's rhythm at separation from bypass is  normal sinus rhythm.  Atrial pacing is employed to increase the heart rate.  Total cardiopulmonary bypass time for the operation is 140 minutes.   The venous and arterial cannulae are removed uneventfully.  Protamine is  administered to reverse the anticoagulation.  The mediastinum and both left  and right pleural spaces are irrigated with saline solution containing  vancomycin.  Meticulous surgical hemostasis is ascertained.  The mediastinum  and both pleural spaces are drained with four chest tubes placed through   separate stab incisions inferiorly.  The median sternotomy is closed with  double-strength sternal wires.  The soft tissues anterior to the sternum are  closed in multiple layers and the skin is closed with a running subcuticular  skin closure.   The patient tolerated the procedure well and is transported to the surgical  intensive care unit in stable condition.  There are no intraoperative  complications.  All sponge, instrument, and needle counts are verified  correct at completion of the operation.  No blood products were  administered.       CHO/MEDQ  D:  07/23/2004  T:  07/24/2004  Job:  [id number]   cc:   Gerlene Burdock A. Alanda Amass, M.D.  [phone] [address], [address]  [place]  [place] [address]  Fax: [phone]   Patrica Duel, M.D.  [address], [address]  [place]  [place] [address]  Fax: [phone]   Joni Fears, M.D.  [place]

## 2011-01-21 NOTE — Assessment & Plan Note (Signed)
[facility]                           GASTROENTEROLOGY OFFICE NOTE   TRYONE, KILLE A                          MRN:          [id number]  DATE:05/15/2006                            DOB:          Oct 24, 1940    OFFICE CONSULTATION NOTE:   REASON FOR CONSULTATION:  Family history of colon cancer/screening  colonoscopy.   HISTORY:  This is a 70 year old white male with a history of asthma,  hyperlipidemia, and coronary artery disease for which he is status post  coronary artery bypass grafting.  The patient is referred today through the  courtesy of Dr. Vernie Ammons after the patient himself was interested in colon  cancer screening.  He had a brother diagnosed at age 12 with colon cancer  and that same brother subsequently died of the disease at age 68.  His  father died of an aortic aneurysm in his 76s.  Mother died of a stroke at  age 76.  It is not clear that they ever had colonoscopy yet.  He also has a  brother age 65, sister age 67, and a sister age 28 who he thinks have had  this screening with no problems found.  The patient himself has a negative  GI review of systems.  No nausea, vomiting, heartburn, abdominal pain,  dysphagia, melena, hematochezia, change in bowel habits or weight change.   PAST MEDICAL HISTORY:  1. Asthma.  2. Hyperlipidemia.  3. Coronary artery disease, status post coronary artery bypass grafting.  4. Benign prostatic hypertrophy, status post surgical intervention.  5. Sinus difficulties, status post nasal surgery.   FAMILY HISTORY:  Brother with colon cancer at age 3 as discussed above.   SOCIAL HISTORY:  Patient is married with 3 sons.  He lives in [place].  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  He does not smoke or use alcohol.   ALLERGIES:  1. PENICILLIN which causes headache.  2. DEMEROL which results in sweats.   CURRENT MEDICATIONS:  1. Beconase nasal spray b.i.d.  2. Allopurinol 300 mg daily.  3. Aspirin 81  mg a day.  4. Singulair 10 mg a day.  5. Vytorin 10/40 mg a day.  6. Toprol XL 12.5 mg daily.  7. Parafon Forte p.r.n.  8. Albuterol inhaler p.r.n.   REVIEW OF SYSTEMS:  Per diagnostic evaluation form.   PHYSICAL EXAMINATION:  GENERAL:  Well-appearing male in no acute distress.  VITAL SIGNS:  Blood pressure 140/78.  Heart rate is 88 and regular.  Weight  is 163.6 pounds.  He is 5 feet 8 inches in height.  HEENT:  Sclerae anicteric.  Conjunctivae are pink.  Oral mucosa intact.  No  adenopathy.  LUNGS:  Clear.  HEART:  Regular.  ABDOMEN:  Soft, without tenderness, mass or hernia.  Good bowel sounds  heard.  EXTREMITIES:  Without edema.   IMPRESSION:  A 70 year old gentleman with a family history of colon cancer  in first degree relative as discussed.  He presents regarding screening  colonoscopy.  He is an appropriate candidate without contraindication.  The  nature of the procedure  as well as the risks, (perforation, bleeding,  cardiopulmonary complications, missed lesions), benefits and alternative  strategies have been discussed.  He understood and agreed to proceed.  He  desires the pill-based Osmo prep as well as an afternoon appointment.                                   Wilhemina Bonito. Eda Keys., MD   JNP/MedQ  DD:  05/15/2006  DT:  05/15/2006  Job #:  [id number]   cc:   Patrica Duel, M.D.  Veverly Fells. Vernie Ammons, M.D.

## 2011-01-21 NOTE — Discharge Summary (Signed)
NAME:  [name], [name]                   ACCOUNT NO.:  [id number]   MEDICAL RECORD NO.:  [id number]          PATIENT TYPE:  INP   LOCATION:  2040                         FACILITY:  [facility]   PHYSICIAN:  Salvatore Decent. Cornelius Moras, M.D. DATE OF BIRTH:  Apr 06, 1941   DATE OF ADMISSION:  07/23/2004  DATE OF DISCHARGE:                                 DISCHARGE SUMMARY   DIAGNOSES:  1.  Exertional shortness of breath.  2.  Severe three-vessel coronary artery disease.   PAST MEDICAL HISTORY:  1.  Hyperlipidemia.  2.  Asthmatic bronchitis.  3.  Gout.  4.  Kidney stones.  5.  Benign prostatic hypertrophy.  6.  Status post transurethral prostatectomy.  7.  Status post nasal surgery.  8.  Severe three-vessel coronary artery disease status post coronary artery      bypass grafting x4.  9.  Mild postoperative anemia, resolved.   ALLERGIES:  1.  PENICILLIN which causes headaches.  2.  DEMEROL which causes severe diaphoresis.  3.  VERSED OR MIDAZOLAM which causes prostate pain.   BRIEF HISTORY:  The patient is a 70 year old white male who has been  followed by Dr. Patrica Duel for a history of hyperlipidemia, kidney  stones, asthmatic bronchitis and gout.  The patient presented to Dr.  Nobie Putnam with symptoms of exertional fatigue, occasional palpitations and  exertional shortness of breath.  He was subsequently referred to Dr. Pearletha Furl. Alanda Amass and underwent a stress Cardiolite exam on October 14, which  reportedly demonstrated a resting ejection fraction of 55% with evidence of  mild ischemia.  The patient then underwent an elective cardiac  catheterization also by Dr. Pearletha Furl. Alanda Amass on November 10, findings  which were notable for severe three-vessel coronary artery disease and a  preserved left ventricular function.  Mr. Bidinger was subsequently referred to  Dr. Salvatore Decent. Cornelius Moras of [facility] regarding surgical revascularization.  After  review of the patient and his information, it was Dr. Orvan July  opinion that  the patient should undergo coronary artery bypass graft surgery.   HOSPITAL COURSE:  The patient was admitted and taken to the OR on July 23, 2004, for coronary artery bypass grafting x4.  The right internal  mammary artery was grafted to the LAD, the left internal mammary artery was  grafted to the OM, saphenous vein was grafted to the PDA and saphenous vein  was grafted to the diagonal.  Endoscopic vessel harvesting was performed on  the left thigh.  The patient tolerated the procedure well and was  hemodynamically stable immediately postoperatively.  The patient was  transferred from the OR to the SICU in stable condition.  The patient was  extubated without complication and woke up from anesthesia neurologically  intact.   The patient's postoperative course was as expected.  He has maintained a  normal sinus rhythm postoperatively.  His vital signs have also remained  stable postoperatively, the only problem being slight tachycardia for which  he was treated appropriately with elevated beta blocker dose.  The patient's  invasive lines and chest tubes were  discontinued on postoperative day #1, in  a routine manner.  The patient was volume overloaded postoperatively, and  has been diuresed accordingly.  The patient began cardiac rehabilitation on  postoperative day 2 and has tolerated this well.   The patient was transferred from SICU to Unit 2000 without complication.  On  postoperative day #4, he is afebrile with stable vital signs. He is feeling  well and ambulating independently.   PHYSICAL EXAMINATION:  CARDIAC:  Regular rate and rhythm.  He is maintaining  a normal sinus rhythm.  LUNG:  Exam reveals faint bibasilar crackles.  ABDOMEN:  Benign.  There is 1 to 2+ pitting edema in the left leg, and 1+  edema in the right leg.  The sternal incision is clean, dry and intact.  EXTREMITIES:  Right lower extremity incision reveals slight ecchymosis, and  the left  leg is with extensive ecchymosis from the groin through mid calf.  This is also slightly tender.   DISCHARGE CONDITION:  The patient is in stable condition at this time, and  has progressed very well postoperatively.  His Lasix was discontinued on  postoperative day #4 secondary to the patient's believe that it was causing  anxiety attacks.  This was changed to hydrochlorothiazide.  The patient is  doing very well at this time.  As long as he continues to progress in the  current manner, he will be ready for discharge within the next one to two  days.   LABORATORY DATA:  CBC on July 26, 2004, hemoglobin 9.5, hematocrit 27.4.  White count 20.5.  Platelets 183,000.  BNP on July 26, 2004, sodium 106,  potassium 4.1, BUN 6, creatinine 1.0.  Glucose 109.   DISCHARGE CONDITION:  Improved.   DISCHARGE MEDICATIONS:  1.  Aspirin 325 mg daily.  2.  Toprol XL, 100 mg daily.  3.  Zyloprim 100 mg daily.  4.  Vytorin 10 per 20 mg daily.  5.  Beconase inhaler, two puffs b.i.d.  6.  Colace 200 mg daily, p.r.n. constipation.  7.  Hydrochlorothiazide 25 mg daily.  8.  Xanax 0.5 mg q.8h. p.r.n. anxiety.  9.  Tylox one to two p.o. q.4-6h. p.r.n. pain.   DISCHARGE ACTIVITY:  No driving, no lifting more than 10 pounds.  The  patient is to continue daily breathing and walking exercises.   DISCHARGE DIET:  Low-salt, low-fat.   WOUND CARE:  The patient may shower daily and clean the incisions with soap  and water.  If the incisions become red, swollen or draining, the patient is  to contact the [facility] office at [phone].   FOLLOWUP APPOINTMENT:  With Dr. Pearletha Furl. Alanda Amass.  This will be  established by his office.  The patient should contact them for an  appointment two weeks after discharge.   The patient will have a chest x-ray taken at the appointment with Dr.  Pearletha Furl. Alanda Amass which he will be responsible for bringing with him to the appointment with Dr. Cornelius Moras on August 23, 2004,  at 12:15 p.m.       AY/MEDQ  D:  07/27/2004  T:  07/27/2004  Job:  [id number]   cc:   Salvatore Decent. Cornelius Moras, M.D.  [address]  [place]  [place] [address]   Richard A. Alanda Amass, M.D.  [phone] [address], [address]  [place]  [place] [address]  Fax: [phone]

## 2011-01-21 NOTE — H&P (Signed)
NAME:  [name], [name]                   ACCOUNT NO.:  [id number]   MEDICAL RECORD NO.:  [id number]          PATIENT TYPE:  INP   LOCATION:  NA                           FACILITY:  [facility]   PHYSICIAN:  Salvatore Decent. Cornelius Moras, M.D. DATE OF BIRTH:  1941-06-21   DATE OF ADMISSION:  07/23/2004  DATE OF DISCHARGE:                                HISTORY & PHYSICAL   CHIEF COMPLAINT:  Exertional shortness of breath.   REASON FOR CONSULTATION:  Severe three vessel coronary artery disease.   HISTORY OF PRESENT ILLNESS:  [name] is a 70 year old white male from  [place] who works as a Production designer, theatre/television/film at [employer].  He has been  followed by Dr. Patrica Duel with a history of hyperlipidemia, kidney  stones, asthmatic bronchitis, and gout.  The patient recently presented with  symptoms of exertional fatigue, occasional palpitations, and exertional  shortness of breath.  He was referred to Dr. Susa Griffins and underwent  a stress Cardiolite exam.  This was performed on October 14 and reportedly  demonstrated a resting ejection fraction of 59% with evidence for mild  ischemia involving the inferolateral wall extending from the base to the  apex.  Following this, [name] underwent elective cardiac catheterization  by Dr. Alanda Amass on November 10 at the [facility].  Findings at  the time of catheterization were notable for severe three vessel coronary  artery disease and preserved left ventricular function.  [name] was  referred to consider elective coronary artery bypass grafting.   REVIEW OF SYMPTOMS:  GENERAL:  The patient reports good appetite.  He has  lost 14 pounds in weight over the last six months, but this has been on a  strict dietary regimen and has been on purpose.  He complains of progressive  fatigue with exertion.  CARDIAC:  Mr. Schack specifically denies any episodes  of substernal chest pain, chest tightness, or chest pressure, either with  activity or rest.  He has  occasionally had some brief episodes of burning  pain in the substernal region but this has been vague and relatively mild.  He has had occasional palpitations.  He does complain of shortness of breath  with exertion and this has progressed substantially over the last few years.  He denies any resting shortness of breath, PND, orthopnea, or lower  extremity edema.  RESPIRATORY:  Notable for history of asthma.  The patient  describes exertional shortness of breath and this had been his typical  complaints when he thinks he is having flares of asthma.  He denies problems  with wheezing.  He specifically denies any persistent cough, productive  cough, hemoptysis, wheezing.  Gastrointestinal:  Negative.  The patient has  no difficulty swallowing.  He reports normal bowel function.  He denies  hematochezia, hematemesis, or  melena.  GENITOURINARY:  Notable for problems  with kidney stones as well as benign prostatic hypertrophy in the past.  He  has been followed by Dr. Vernie Ammons.  He has not had problems with urinary  retention since he underwent transurethral prostatectomy.  He  has not had  hematuria recently.  PERIPHERAL VASCULAR:  Negative.  The patient denies  symptoms of suggestive claudication.  The patient denies a history of deep  vein thrombosis.  MUSCULOSKELETAL:  Notable for chronic problems related to  degenerative disc disease particularly involving his lower back as well as  to some extent, his right hip and his neck.  This does not seem to limit him  too much.  NEUROLOGIC:  Negative.  The patient denies symptoms suggestive of  a previous TIA, stroke, or seizure.  PSYCHIATRIC:  Negative.  The patient  denies history of depression or significant anxiety.  HEENT:  Negative.  The  patient wears glasses.  His eyesight has been stable recently.  He denies  any loose teeth or painful teeth.  INFECTIOUS:  Negative.  The patient  denies any recent fevers or chills.  HEMATOLOGIC:  Negative.   The patient  denies problems with bleeding diathesis or easy bruising.   PAST MEDICAL HISTORY:  1.  Hyperlipidemia.  2.  Asthmatic bronchitis.  3.  Gout.  4.  Kidney stones.  5.  Benign prostatic hypertrophy.   PAST SURGICAL HISTORY:  1.  Transurethral prostatectomy.  2.  Nasal surgery.   FAMILY HISTORY:  Notable in that the patient's brother developed coronary  artery disease at an early age and underwent coronary artery bypass grafting  at age 25.  The patient's brother ultimately died of cancer.   SOCIAL HISTORY:  The patient is married and lives with his wife here in  [place].  He works full time as a Production designer, theatre/television/film at [employer].  He  has three grown children and total of seven grandchildren.  One of his sons  is a Careers adviser who lives in [place].  The patient is a nonsmoker, although  he does report a remote history of tobacco use.  He quit smoking in 1965.  He denies history of significant alcohol consumption.   CURRENT MEDICATIONS:  1.  Vytorin 10/20 1 tablet daily.  2.  Toprol XL 25 mg 1 tablet daily.  3.  Allopurinol 100 mg 1 tablet daily.  4.  Aspirin 82 mg twice daily.  5.  Parafon Forte 1-2 tablets as needed for pain.  6.  Q-Zan inhaler 2 puffs twice daily.  7.  Beconase inhaler 2 puffs twice daily.   ALLERGIES:  Penicillin causes headache, Demerol causes severe diaphoresis  and sweats, Versed or Midazolam causes prostate pain.   PHYSICAL EXAMINATION:  GENERAL:  The patient is a well appearing, moderately obese white male who  appears his stated age in no acute distress.  VITAL SIGNS:  Blood pressure 110/62, pulse 76 and regular, he is afebrile,  oxygen saturation 97% on room air.  HEENT:  Within normal limits.  The patient has good dentition.  NECK:  Supple, there is no cervical or supraclavicular lymphadenopathy.  There is no jugular venous distention. CHEST:  Auscultation of the chest demonstrates clear and symmetrical breath  sounds bilaterally.   No wheezes or rhonchi are demonstrated.  CARDIOVASCULAR:  Regular rate and rhythm.  No murmurs, rubs, or gallops are  noted.  ABDOMEN:  Soft, nontender, there are no palpable masses, bowel sounds  present.  EXTREMITIES:  Warm and well perfused.  There is no lower extremity edema.  Distal pulses are easily palpable in the posterior tibial position.  There  is no sign of significant venous insufficiency.  SKIN:  Clean, dry, healthy appearing throughout.  RECTAL AND GU EXAM:  Both deferred.  NEUROLOGICAL:  Examination is grossly nonfocal and symmetrical.   DIAGNOSTIC TESTS:  Cardiac catheterization performed November 10 by Dr.  Alanda Amass is reviewed.  This demonstrates severe three vessel coronary  artery disease with preserved left ventricular function.  Specifically,  there is a long segment 80-90% stenosis of the mid left anterior descending  coronary artery arising at the take off of the first diagonal branch.  The  first diagonal branch bifurcates and has 80-90% ostial stenosis.  There is  70-80% stenosis of the mid left circumflex coronary artery.  There is 70%  proximal stenosis of the right coronary artery with 50% stenosis as a  tangent lesion at the acute margin.  There is 90% distal stenosis of the  right coronary artery arising just before its bifurcation into the posterior  descending coronary artery and a continuation of the right coronary artery  which gives rise to a single large right posterolateral branch.  Left  ventricular function appears preserved with ejection fraction estimated 55-  60%, no significant wall motion abnormalities.   IMPRESSION:  Severe three vessel coronary artery disease with symptoms of  progressive exertional shortness of breath.  I believe that Mr. Fullwood would  best be treated by elective coronary artery bypass grafting.  This should  provide considerable benefit in terms of prolonged survival, decreased risks  of cardiac event with associated  preservation of myocardium, and probably  considerable symptomatic relief.   PLAN:  I have outlined options at length with Mr. Batterman and his wife here in  the office today.  Alternative treatment strategies have been discussed in  detail,.  They understand and accept all associated risks of surgery  including but not limited to the risks of death, stroke, myocardial  infarction, congestive heart failure, respiratory failure, pneumonia,  bleeding requiring blood transfusion, arrhythmia, infection, and recurrent  coronary artery disease.  All their questions have been addressed.  We  tentatively plan to proceed with surgery later this week on Friday, November  18.       CHO/MEDQ  D:  07/19/2004  T:  07/19/2004  Job:  [id number]   cc:   Gerlene Burdock A. Alanda Amass, M.D.  [phone], [address]  [place]  [place] [address]  Fax: [phone]   Patrica Duel, M.D.  [address], [address] [place]  [place] [address]  Fax: [phone]   Joni Fears, M.D.

## 2011-01-21 NOTE — Op Note (Signed)
[facility]  Patient:    [name], [name] Visit Number: [id number] MRN: [id number]          Service Type: SUR Location: 3W [id number] [id number] Attending Physician:  Trisha Mangle Dictated by:   Veverly Fells Vernie Ammons, M.D. Proc. 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  PROCEDURE:  Transurethral resection of prostate.  SURGEON:  Mark C. Vernie Ammons, M.D.  ANESTHESIA:  General.  DRAINS:  24 French Foley catheter.  SPECIMENS:  Prostatic chips to pathology.  ESTIMATED BLOOD LOSS:  Approximately 100 cc.  COMPLICATIONS:  None.  INDICATIONS FOR PROCEDURE:  The patient is a 70 year old black male with significant outlet obstructive symptoms. He has been tried on alpha blockage therapy but is unable to tolerate the increased dosages and has elected to proceed with surgical correction. He understands the risks, complications, alternatives, and limitations.  DESCRIPTION OF PROCEDURE:  After informed consent, the patient was brought to the major OR, placed on the table, administered general anesthesia and then moved to the dorsal lithotomy position. The urethra was sounded with Sissy Hoff sounds and he was noted to have a mild bulbar urethral stricture that was dilated with the [employer] sounds to 28 french. I then inserted the 28 French resectoscope with the Timberlake obturator into bladder removing the obturator and draining the bladder.  A 12 degree lens with a resectoscope element was then inserted and the bladder was fully inspected and noted to be free of any tumor, stones or inflammatory lesions. The ureteral orifices were well away from the bladder neck. The prostatic urethra revealed elongation and significant bilobar  hypertrophy. This was photographed. Resection was begun then at the 6 oclock position resecting from the bladder neck back to the veru. I then resected away the lateral lobes initially starting in the 6 oclock position and resecting the left lobe in a counter clockwise position from the bladder neck back to the veru and resecting away all adenomatous tissue down to the prostatic capsule. Bleeding points were intermittently cauterized. The right lobe was then resected in identical fashion. I then withdrew the scope back to the level of the veru resecting some adenomatous tissue at the apex but maintaining the resection proximal to the veru at all times. There was some anterior tissue then hanging down at the level of the veru, this was resected away and all chips were then evacuated from the bladder. The bladder was inspected and noted to be intact, orifices were also intact and ______ bleeding points were cauterized. I then removed the resectoscope and noted a good stream of fluid per urethra and inserted the 24 French Foley catheter. The bladder was irrigated and the return was clear. The patient was therefore awakened and then taken to the recovery room in stable satisfactory condition. The patient tolerated the procedure well. There were no intraoperative complications. He did receive a B&O suppository per rectum prior to awakening. Dictated by:   Veverly Fells Vernie Ammons, M.D. Attending Physician:  Trisha Mangle DD:  10/12/01 TD:  10/12/01 Job: [id number]

## 2011-04-06 DIAGNOSIS — I25810 Atherosclerosis of coronary artery bypass graft(s) without angina pectoris: Secondary | ICD-10-CM

## 2011-04-06 HISTORY — DX: Atherosclerosis of coronary artery bypass graft(s) without angina pectoris: I25.810

## 2011-04-26 ENCOUNTER — Ambulatory Visit (HOSPITAL_COMMUNITY)
Admission: RE | Admit: 2011-04-26 | Discharge: 2011-04-26 | Disposition: A | Discharge status: Home / Self Care | Payer: 59 | Source: Ambulatory Visit | Attending: Cardiology | Admitting: Cardiology

## 2011-04-26 DIAGNOSIS — I25810 Atherosclerosis of coronary artery bypass graft(s) without angina pectoris: Secondary | ICD-10-CM | POA: Insufficient documentation

## 2011-04-26 DIAGNOSIS — R5383 Other fatigue: Secondary | ICD-10-CM | POA: Insufficient documentation

## 2011-04-26 DIAGNOSIS — I2510 Atherosclerotic heart disease of native coronary artery without angina pectoris: Secondary | ICD-10-CM | POA: Insufficient documentation

## 2011-04-26 DIAGNOSIS — R5381 Other malaise: Secondary | ICD-10-CM | POA: Insufficient documentation

## 2011-04-26 DIAGNOSIS — R0989 Other specified symptoms and signs involving the circulatory and respiratory systems: Secondary | ICD-10-CM | POA: Insufficient documentation

## 2011-04-26 DIAGNOSIS — R0609 Other forms of dyspnea: Secondary | ICD-10-CM | POA: Insufficient documentation

## 2011-04-26 HISTORY — PX: CARDIAC CATHETERIZATION: SHX172

## 2011-04-27 NOTE — Cardiovascular Report (Signed)
NAMEXAIVIER, MALAY                   ACCOUNT NO.:  [id number]  MEDICAL RECORD NO.:  [id number]  LOCATION:  [facility]                         FACILITY:  [facility]  PHYSICIAN:  Landry Corporal, MD DATE OF BIRTH:  December 01, 1940  DATE OF PROCEDURE:  04/26/2011 DATE OF DISCHARGE:  04/26/2011                           CARDIAC CATHETERIZATION   PRIMARY CARE PHYSICIAN:  [name], M.D.  PRIMARY CARDIOLOGIST:  Landry Corporal, M.D.  PROCEDURES PERFORMED: 1. Left heart catheterization without left ventriculography. 2. Native coronary artery angiography. 3. Saphenous vein graft angiography. 4. Both left and right internal mammary artery grafting angiography. 5. Aortic root angiogram.  INDICATIONS: 1. Excess fatigue, dyspnea on exertion with known coronary artery     disease. 2. An abnormal stress test.  Mr. Paolo is a 70 year old gentleman with history of four-vessel CABG in 2005, which he had underwent LIMA to LAD, RIMA to LAD, LIMA to the circumflex obtuse marginal branch, SVG to PDA, and SVG to diagonal.  He was doing relatively well for the first five years and then about two years ago, had an episode lasting about  45 minutes where he was diaphoretic, lethargic, near-syncopal, and ever since then, he has continued to have some slow decline with decreased exercise tolerance, increased fatigue at the end of the day of exertion.  He is noticing now increasing frequency of PVCs.  This is above and beyond what he previously had.  He just underwent a treadmill nuclear stress test during which he had increasing frequency of PVCs with runs of triplets and quadruplets at peak exertion.  The imaging did not reveal any significant areas of ischemia by the computer analysis, however, on visualization there did appear to be some inferolateral ischemia, as well as possible anterolateral ischemia. Based on the abnormal EKG, his persistent symptoms, and the equivocal findings on the imaging, he  was referred for diagnostic cardiac catheterization.  We did discuss the risk, benefits, alternatives, indications of the procedure in full detail and he agreed to proceed.  PROCEDURE:  The patient was brought to the second floor of [facility] lab from the same day area in the fashioned state.  He was prepped and draped in usual sterile fashion.  He was premedicated with 5 mL of Versed in the same day area.  After time-out period was performed, the patient was sedated with the intravenous fentanyl as he has had adverse reaction to midazolam.  The right femoral head was then localized using tactile fluoroscopic guidance and the right groin was anesthetized using 1% lidocaine.  Right common femoral artery was then accessed using modified Seldinger technique with 5- French sheath.  Sheath appeared flushed.  First a 5-French JL-4 catheter sensor was wired, followed by the JR-4 catheter and these were used to perform angiographic views of the left and right coronaries.  The JR-4 catheter was then redirected and then used to engage the saphenous vein graft to both the diagonal and to the PDA.  This catheter was then redirected back across the aortic arch into the left subclavian artery. However, I was unable to pass the wire out into the subclavian artery, and therefore the catheter  was exchanged for an IMA catheter over wire. After attempting to engage the left internal mammary artery,  I was actually more successful to get the catheter into the right subclavian/innominate artery and therefore, decision was made to extend the catheter out of her long exchange wire and take angiogram of the right internal mammary artery graft to LAD.  With the cuff inflated, an semi-selective angiogram of the right internal mammary artery graft was performed in two views.  The artery was then pulled back in the aortic arch and successfully used to engage the left subclavian artery.  I  did aspirate and flushed with each wire exchange and advancing of the catheter.  Finally,  I was able to get the catheter into the left subclavian artery and pick up.  The blood pressure cuff was transferred to the left arm and non-selective angiography of the left internal mammary suggested an atretic graft. The catheter was then redirected into selective imaging and nonselective angiogram of the left internal mammary artery was obtained and identified the lymphatic vessel.  The catheters were then let into the aorta and advanced over wire into the ascending aorta and then that was used to attempt to engage both the vein grafts and reattempt to perform vein graft angiography.  The catheter was then exchanged over wire and an angled pigtail catheter was used to advance across the aortic valve.  Ventricular hemodynamics was performed and the catheter was pulled back across the aortic valve to measure pullback gradient.  Left ventriculogram was not performed as an echocardiogram and stress test recently demonstrated preserved ejection fraction.  Therefore, an aortic root angiogram was performed with a total of 30 mL contrast and 50 mL contrast per second.  This demonstrated flush occlusion of both the vein grafts at the marker sites.  Catheter was removed out of the body over wire without any difficulty.  The patient was transferred to holding area for sheath pull and adequate hemostasis was obtained with manual pressure held.  Please note this is a very complex and difficult procedure due to the seen tortuosity of the patient's aortic arch and having to engage both IMA grafts.  The overall time for engaging the internal mammary artery grafts was in excess of 45 minutes with significant amount of manipulations of wire exchanges and aspiration and flushing.  The patient however remained stable throughout the entire procedure, both before, during, and after the procedure. There were no  complications.  Estimated blood loss was less than 20 mL.  CATHETER LAB STATISTICS:  A total of 50 mcg fentanyl was used to sedation.  Total of 150 milliliters of contrast was used.  HEMODYNAMIC RESULTS: 1. Left ventricular pressure is 168/30 mmHg, with an EDP of 20 mmHg. 2. Central aortic pressure 160/94 mmHg, with a mean of 125 mmHg.  ANGIOGRAPHIC FINDINGS: 1. The right internal mammary artery graft to the LAD was widely     patent with retrograde filling of two diagonal branches, one of     which was a proximal bifurcating vessel that had also been grafted.     The distal runoff of the LAD gives brisk collateral flow to the     right posterior ascending artery. 2. The left internal mammary artery graft to the circumflex OM at the     bifurcation point just after the ostium was severely stenotic and     atretic and barely reached to the heart. 3. The saphenous vein graft to the diagonal branch and to the  right     PDA were both 100% occluded at the aorta and ostial junction 4. Native coronary artery angiography. 5. Left main is moderate large-caliber vessel that tapers to roughly     30% distal stenosis.  It branches normally to an LAD and a     circumflex artery. 6. The LAD has a severe proximal 80-90% eccentric stenosis and then itgives rise to a septal perforator and then noticed at the     bifurcation of the first diagonal branch is roughly 70% lesion.     There is one more additional diagonal branch noted.  There is to-     and-fro flow of contrast in the mid portion of the vessel,     indicative of a patent IMA graft. 7. Circumflex artery essentially gives rise to a large first large     obtuse marginal branch.  Just at the point of the takeoff of the     small obtuse marginal branch, there is a 90-95% of lesion, followed     by lesion in the OM branch.  There is no any other additional     vessel in the circumflex system. 8. Right coronary artery appears to be a dominant  vessel.  There is a     proximal LAD followed by 60% lesions in this vessel.  There are     several right ventricular marginal branches and at the bifurcation,     there is a to-and-fro flow in what appears to be the right     posterior descending artery, which is occluded.  After this in the     right posterior right posterior interventricular groove, there is     at least 50-60% lesion prior to extensive posterolateral branch     system.  IMPRESSION: 1. Severe native coronary artery disease with severe proximal and mid     LAD lesions with patent LIMA to LAD with brisk retrograde filling     of the diagonal branch that had been grafted as well as the     posterior  descending artery.  There is also severe 80% to 90-95%     on mid circumflex OM disease, as well as severe 70% and 80% tandem     lesions in the proximal RCA with a possibly significant lesion in     the distal portion in the right posterior atrioventricular groove     as well. 2. Three out of four grafts occluded with the mean LAD being atretic     and 100% first occlusion of the SVG to PDA and SVG to diagonal. 3. Mildly elevated EDP,  PLAN: 1. Due to the patient discomfort and with also bladder, difficulty     voiding, and back pain, and it was very complex and difficult to     diagnose the case  requiring significant amount of contrast use, I     had a conversation with the patient and we made a decision to     discontinue the procedure at this time with a plan to return at a     later date for multivessel PCI of the circumflex, OM, and RCA.  I     reviewed these images with my colleagues to determine the need to     do two lesion stenting in the RCA within the proximal lesions as     well as the distal lesion in the posterior atrioventricular groove.     We will also determine whether or  not to response to admitting to     use a rotablator device. 2. We will go ahead and start the patient on prasugrel for pre-      stenting vessel stabilization. 3. We will continue with standard postcatheterization care and     discharge this afternoon 4. The patient has significant amount of anxiety and I was asked for     some p.r.n. Valium for helping sleep as the patient had a very good     result with his premedication of Valium here.  I did prescribe 2.5     mg of Valium q.h.s. for the duration until he returns for his     return PCI.          ______________________________ Landry Corporal, MD     DWH/MEDQ  D:  04/26/2011  T:  04/27/2011  Job:  [id number]  cc:   [name], M.D. [facility]  Electronically Signed by Bryan Lemma MD on 04/27/2011 10:01:21 PM

## 2011-04-28 ENCOUNTER — Ambulatory Visit (HOSPITAL_COMMUNITY)
Admission: RE | Admit: 2011-04-28 | Discharge: 2011-04-29 | Disposition: A | Discharge status: Home / Self Care | Payer: 59 | Source: Ambulatory Visit | Attending: Cardiology | Admitting: Cardiology

## 2011-04-28 DIAGNOSIS — I2510 Atherosclerotic heart disease of native coronary artery without angina pectoris: Secondary | ICD-10-CM | POA: Insufficient documentation

## 2011-04-28 DIAGNOSIS — I10 Essential (primary) hypertension: Secondary | ICD-10-CM | POA: Insufficient documentation

## 2011-04-28 DIAGNOSIS — I25810 Atherosclerosis of coronary artery bypass graft(s) without angina pectoris: Secondary | ICD-10-CM | POA: Insufficient documentation

## 2011-04-28 DIAGNOSIS — Z9861 Coronary angioplasty status: Secondary | ICD-10-CM | POA: Insufficient documentation

## 2011-04-28 DIAGNOSIS — D45 Polycythemia vera: Secondary | ICD-10-CM | POA: Insufficient documentation

## 2011-04-28 DIAGNOSIS — Z01812 Encounter for preprocedural laboratory examination: Secondary | ICD-10-CM | POA: Insufficient documentation

## 2011-04-28 DIAGNOSIS — D72829 Elevated white blood cell count, unspecified: Secondary | ICD-10-CM | POA: Insufficient documentation

## 2011-04-28 DIAGNOSIS — E785 Hyperlipidemia, unspecified: Secondary | ICD-10-CM | POA: Insufficient documentation

## 2011-04-28 LAB — BASIC METABOLIC PANEL
Calcium: 9.3 mg/dL (ref 8.4–10.5)
GFR calc non Af Amer: >60 mL/min (ref >60)
Sodium: 139 mEq/L (ref 135–145)

## 2011-04-28 LAB — PROTIME-INR: Prothrombin Time: 13.2 seconds (ref 11.6–15.2)

## 2011-04-28 LAB — POTASSIUM: Potassium: 4.1 mEq/L (ref 3.5–5.1)

## 2011-04-28 LAB — CBC
MCH: 28.8 pg (ref 26.0–34.0)
MCHC: 34.4 g/dL (ref 30.0–36.0)
Platelets: 172 10*3/uL (ref 150–400)
RDW: 13 % (ref 11.5–15.5)

## 2011-04-28 LAB — POCT ACTIVATED CLOTTING TIME: Activated Clotting Time: 512 seconds

## 2011-04-28 LAB — APTT: aPTT: 29 seconds (ref 24–37)

## 2011-04-29 LAB — BASIC METABOLIC PANEL
Calcium: 9.1 mg/dL (ref 8.4–10.5)
GFR calc Af Amer: >60 mL/min (ref >60)
GFR calc non Af Amer: >60 mL/min (ref >60)
Glucose, Bld: 91 mg/dL (ref 70–99)
Potassium: 4 mEq/L (ref 3.5–5.1)
Sodium: 138 mEq/L (ref 135–145)

## 2011-04-29 LAB — CBC
Hemoglobin: 16.3 g/dL (ref 13.0–17.0)
MCHC: 34.8 g/dL (ref 30.0–36.0)
Platelets: 176 10*3/uL (ref 150–400)
RDW: 13.2 % (ref 11.5–15.5)

## 2011-05-01 NOTE — Cardiovascular Report (Signed)
NAMEIMMANUEL, [name]                   ACCOUNT NO.:  [id number]  MEDICAL RECORD NO.:  [id number]  LOCATION:  6526                         FACILITY:  [facility]  PHYSICIAN:  Landry Corporal, MD DATE OF BIRTH:  1941-01-25  DATE OF PROCEDURE:  04/28/2011 DATE OF DISCHARGE:                           CARDIAC CATHETERIZATION   PRIMARY CARE PHYSICIAN:  [name], MD  PERFORMING PHYSICIAN:  Landry Corporal, MD  PROCEDURES PERFORMED: 1. Left heart catheterization via 6-French right femoral artery     access. 2. Successful multilesion percutaneous coronary intervention of the     left circumflex to lateral obtuse marginal branch sequential     lesions with overlapping Resolute drug-eluting stents 3.0 x 15 mm x     15 mm and 3.0 mm x 18 mm stents postdilated up to 3.25 mm     proximally and 3.15 more distally. 3. Successful multiple lesion percutaneous coronary intervention of     the proximal to mid right coronary artery with a single Promus     Element drug-eluting stent 3.0 x 38 mm. 4. Intracoronary nitroglycerin x2.  INDICATIONS:  Planned staged PCI after diagnostic catheterization performed on April 26, 2011.  Please see that report for full details.  BRIEF HISTORY:  [name] is a very pleasant 70 year old gentleman with history of bypass grafting 7 years ago with LIMA to left circumflex, LIMA to LAD, SVG to PDA, and SVG to diagonal branch who was seen for worsening fatigue, dyspnea on exertion, and underwent treadmill stress test where there was increased frequency of PVCs with potential inferolateral ischemia on imaging.  He was then referred for a diagnostic catheterization and was found to have the saphenous vein graft to his diagonal and PDA occluded as well as an atretic LIMA to the circumflex marginal branch.  He had relatively consistent stable native artery disease of the right coronary artery and circumflex and lateral OM branch.  He is now returning for planned  multivessel PCI of the circumflex/OM and the proximal mid RCA.  The risks, benefits, alternatives, and indications of the procedure were explained to the patient in detail.  Informed consent was obtained with signed form placed on the chart.  DESCRIPTION OF PROCEDURE:  The patient was brought to the second floor [facility] catheterization lab and prepped and draped in the usual sterile fashion for right femoral artery access.  A time-out period was performed and the patient was sedated with intravenous fentanyl.  He had received 5 mg of oral Valium in the same-day area.  After time-out period was performed, the right groin was anesthetized using 1% subcutaneous lidocaine and the right femoral head was localized using tactile and fluoroscopic guidance.  The right common femoral artery was then accessed using the modified Seldinger technique and placement of 5- French sheath.  The sheath was aspirated and flushed.  Then first the initial intervention was on the left circumflex lesion.  At the time of sheath placement, Angiomax bolus and drip was administered and the patient had been receiving 10 mg of Prasugrel daily after 60 mg load on the day of the diagnostic catheterization.  INTERVENTION PROCEDURE:  + Lesion #1: 1.  Mid left circumflex had a bend point with bifurcation of a very     small obtuse marginal branch.  This roughly 90% lesion decreased     down to 0% post stenosis and post PCI.     a.     Guide catheter:  XB 3.5; guidewire intubation of wire down      the small obtuse marginal branch and Prowater in the lateral      obtuse marginal branch. 2. Predilatation balloon, 2.5 mm x 12 mm Sprinter balloon.     a.     10 atmospheres for 49 seconds. 3. Stent:  Resolute drug-eluting stent 3.0 mm x 15 mm.     a.     10 atmospheres for 45 seconds.     b.     12 atmospheres for 47 seconds. 4. Postdilatation balloon 3.25 mm x 12 mm [place] Sprinter.     a.     16 atmospheres for 60  seconds.     b.     Post-deployment and postdilatation angiography revealed that      a previously noted smaller potentially 60% lesion more distal to      lesion #1 is now on the lateral obtuse marginal branch.  It was      now noted to be more severe of 3 vessels, now larger making the      lesion more like 70%.  Decision was therefore made to proceed with      direct stenting of this lesion to overlap with a new stent      overlapping the original stent.  Lesion #2: 1. Lateral OM branch now 70% lesion reduced to 0% poststent.     a.     Guide catheter:  XB 3.5; guidewire Prowater. 2. Stent:  3.0 mm x 18 mm Resolute drug-eluting stent.     a.     10 atmospheres 60 seconds.     b.     Balloon pulled back slightly to the overlapped section.  14      atmospheres for 60 seconds.  Final diameter is 3.15 distally with      3.25 in the midportion. 3. Second stent deployment and postdilatation angiography revealed     excellent stent apposition with no dissection or perforation.  The     guidewire was then removed completely out of the body and post-wire     removal angiography revealed no residual stenosis.  The guide     catheter was then removed completely out of the body and the     attention will be then turned to the right coronary artery.  Lesion #3: 1. Proximal-to-mid RCA with long sequential segment of lesions     initially in the very most proximal segment is roughly 90% followed     by 70-80% and then another 60-70% lesion.  Then it tapers off to a     normal segment followed by another 60% lesion.  Poststent placement     0% stenosis with TIMI 3 flow before and after.     a.     Guide catheter:  JR-4; guidewire intubation. 2. Predilatation balloon:  3.5 mm x 12 mL Sprinter balloon.     a.     10 atmospheres for 30 seconds.     b.     The balloon was then used to attempt to size the necessary      stent.     c.  Two separate stents, one at 26 mm and one at 30 mm, both       Integrity drug-eluting stents were advanced into the right      coronary artery.  The 26 mm appeared to be too short to adequately      cover the more proximal segment of lesion and then avoid the more      distal 60% lesion and the 30 mm stent was not sufficient to cover      all of the sequential lesions with the distal end of the stent      being midway through the most distal lesion.  At this time, the      decision was made to go with one long single stent to cover all 4      sequential lesions to avoid the same problem that happened on the      circumflex OM lesions. 3. Stent:  Promus Element drug-eluting stent 3.0 mm x 38 mm.     a.     12 atmospheres for 45 seconds.     b.     16 atmospheres for 60 seconds. 4. Postdilatation balloon [place] Sprinter 3.25 mm x 21 mm.     a.     14 atmospheres for 60 seconds in the mid RCA distal portion      of the stent.     b.     18 atmospheres for 60 seconds in the most proximal portion      of the stent.     c.     Poststent deployment and postdilatation angiography revealed      excellent stent positioning with 0% residual stenosis.  No      dissection or perforation.  The wire and catheter were removed      completely out of the body without any complications.  The JR-4      catheter was then advanced across the aortic valve for measurement      of left ventricular hemodynamics and the catheter was pulled back      across the valve for measurement of pullback gradient.  Then the      catheters were removed completely out of the body over the wire      without any complications.  Sheath was then sutured in place.      Angiomax drip was discontinued and the patient was transferred to      the holding area for ongoing care.  The patient was stable before, during, and after the procedure with no complications.  ESTIMATED BLOOD LOSS:  Less than 20 mm.  CATHETERIZATION LABORATORY STATISTICS: 1. Sedation:  175 mg of fentanyl total and Valium 5 mg  p.o.     preprocedure. 2. Contrast:  290 mL. 3. Intracoronary nitroglycerin injection x2, one to the circumflex     prior to placement of stent 2 and one down the right coronary prior     to placement of stent  HEMODYNAMIC RESULTS: 1. Aortic pressures:  Central aortic pressure 153/90 mmHg with a mean     pressure of 118 mmHg. 2. Left ventricular pressure 156/70 mmHg with an EDP of 14 mmHg.  IMPRESSION:  Successful multivessel percutaneous intervention with 2 long segments of diffusely diseased segments in the mid left circumflex to proximal lateral obtuse marginal branch and the proximal-to-mid right coronary artery with drug-eluting stents.  PLAN: 1. Continue postcatheterization care and monitoring for overnight.  He     may very well need  a Foley catheter placed to alleviate bladder     pressure during bedrest.  We will give p.r.n. morphine and p.o.     Valium as need for his back pain during his bedrest. 2. He will continue Prasugrel and aspirin with Prasugrel being for at     least a year.  He will be on 325 mg aspirin until I see him in     clinic and likely switch him down to 81 mg at that time. 3. Likely discharge in the morning unless anything happens overnight.     He will follow up with me as scheduled next week.          ______________________________ Landry Corporal, MD     DWH/MEDQ  D:  04/28/2011  T:  04/29/2011  Job:  [id number]  cc:   [name], M.D. Second Floor [facility] Cardiac Catheterization Laboratory  Electronically Signed by Bryan Lemma MD on 05/01/2011 09:28:50 PM

## 2011-05-02 NOTE — Discharge Summary (Signed)
NAMEALWYN, [name]                   ACCOUNT NO.:  [id number]  MEDICAL RECORD NO.:  [id number]  LOCATION:  6526                         FACILITY:  [facility]  PHYSICIAN:  [place] Stesha Neyens, MD         DATE OF BIRTH:  12/22/1940  DATE OF ADMISSION:  04/28/2011 DATE OF DISCHARGE:  04/29/2011                              DISCHARGE SUMMARY   DISCHARGE DIAGNOSES: 1. Coronary artery disease, status post multivessel percutaneous     coronary intervention from the mid left circumflex to the proximal     lateral obtuse marginal, and then also to the proximal-to-mid right     coronary artery with drug-eluting stents.  Also has a history of     coronary artery bypass grafting in 2005. 2. Dyslipidemia. 3. Hypertension. 4. Polycythemia. 5. Leukocytosis.  HOSPITAL COURSE:  [name] is 70 year old Caucasian male with a history of coronary artery disease, status post bypass grafting in 2005, polycythemia, hyperlipidemia, borderline hypertension, BPH, status post TURP, nephrolithiasis, asthmatic bronchitis, gout, status post nasal surgery, ED.  He had been seen by Dr. Herbie Baltimore after getting a treadmill Myoview for increased symptoms of palpitations, skipping beats, heaviness, fatigue, and tiredness.  The stress test was done obvious for a great mitral ischemia; however, there was some areas that visually looked like there could be some problems.  He is subsequently set up for outpatient left heart catheterization.  He initially came in on April 26, 2011 for outpatient cath, but the procedure was terminated due to the patient's discomfort in his bladder, back pain.  He was then rescheduled for the April 28, 2011.  PROCEDURES PERFORMED:  Successful multilesion percutaneous coronary intervention of the left circumflex to lateral obtuse marginal branch, sequential lesions with overlapping Resolute drug-eluting stent 3.0 x 15 mm and 3.0 mm x 18 mm stent postdilated with 3.25 mmHg proximally and 3.15 more  distally.  Successful multilesion percutaneous coronary intervention of the proximal to mid right coronary artery with a single Promus drug-eluting stent of 3.0 x 38 mm.  [name] tolerated the procedure well.  He has been seen by Dr. Rennis [name], feels he is stable for discharge home.  He will also be started on Coreg 3.125 mg b.i.d. in addition to Effient.  DISCHARGE LABS:  WBCs 15.4, hemoglobin 16.3, hematocrit 46.9, platelets 176.  Sodium 138, potassium 4.0, chloride 103, carbon dioxide 28, BUN 14, creatinine 0.97, glucose 91, calcium 9.1.  STUDIES/PROCEDURES:  Left heart catheterization, percutaneous coronary intervention, multivessel to the mid left circumflex to the proximal lateral OM, and the proximal-to-mid RCA with drug-eluting stents.  See Cath Lab report for more detail.  DISCHARGE MEDICATIONS: 1. Aspirin 325 mg one tablet by mouth daily. 2. Coreg 3.125 mg one tablet by mouth twice daily. 3. Prasugrel 10 mg one tablet by mouth daily. 4. Allopurinol 300 mg one tablet by mouth daily. 5. Testosterone 0.5 mL one injection intramuscularly up to two weeks. 6. Vitamin D3 2000 units one tablet by mouth daily. 7. Vytorin 10/40 mg one half tablet by mouth daily. 8. Xanax 0.5 mg one tablet by mouth daily as needed for anxiety.  DISPOSITION:  [name] will be discharged  to home in stable condition. It was recommend that he increase his activity slowly.  He may shower and bathe.  No lifting or driving for 3 days.  He can return to work on May 02, 2011.  He can work from home and now recommended to do it for at least 1 week.  He states his working titles are some personally use of computer only.  He was also recommended to low-sodium, heart-healthy diet.  If catheter site becomes red, painful, swollen or discharges fluid or pus, he is to call our office.  He will follow with Dr. Herbie Baltimore in approximately 1-2 weeks and our office call him with the  appointment time.    ______________________________ [name] Finlay, PA   ______________________________ [place] Estefana Taylor, MD    BH/MEDQ  D:  04/29/2011  T:  04/29/2011  Job:  [id number]  cc:   Landry Corporal, MD [name], M.D.  Electronically Signed by [name] Finlay PA on 05/02/2011 01:32:44 PM Electronically Signed by Kirtland Bouchard. Marieli Rudy M.D. on 05/02/2011 08:21:10 PM

## 2011-05-18 ENCOUNTER — Encounter: Payer: Self-pay | Admitting: Internal Medicine

## 2011-07-06 ENCOUNTER — Ambulatory Visit (INDEPENDENT_AMBULATORY_CARE_PROVIDER_SITE_OTHER): Payer: 59 | Admitting: Internal Medicine

## 2011-07-06 ENCOUNTER — Encounter: Payer: Self-pay | Admitting: Internal Medicine

## 2011-07-06 VITALS — BP 128/64 | HR 58 | Ht 68.0 in | Wt 178.0 lb

## 2011-07-06 DIAGNOSIS — I2510 Atherosclerotic heart disease of native coronary artery without angina pectoris: Secondary | ICD-10-CM

## 2011-07-06 DIAGNOSIS — Z800 Family history of malignant neoplasm of digestive organs: Secondary | ICD-10-CM

## 2011-07-06 DIAGNOSIS — K2980 Duodenitis without bleeding: Secondary | ICD-10-CM

## 2011-07-06 MED ORDER — OMEPRAZOLE 20 MG PO CPDR: 20.0000 mg | DELAYED_RELEASE_CAPSULE | Freq: Every day | ORAL | Status: DC

## 2011-07-06 NOTE — Progress Notes (Signed)
HISTORY OF PRESENT ILLNESS:  [name] is a 70 y.o. male with multiple medical problems including coronary artery disease status post CABG, hyperlipidemia, asthma, and arthritis. He was last evaluated January 2011, in the office, for post endoscopy followup regarding they dysphagia and GERD. Upper endoscopy revealed duodenitis. Testing for Helicobacter pylori return positive. He was treated with Prevpac x2 weeks. Treatment with PPI thereafter. At some point he came off of PPI therapy. He does report reflux symptoms with dietary indiscretion. Most importantly, having difficulties with his heart this past summer. He tells me that in August he underwent cardiac catheterization with multiple stent placement. Since that time, he has been on Effient in addition to aspirin. He presents today regarding surveillance colonoscopy. His last colonoscopy was in September of 2007. Examination was performed for a family history of colon cancer in his brother at age 18. The colonoscopy was normal except for small internal hemorrhoids and sigmoid diverticulosis. Routine followup in 5 years recommended. His GI review of systems is entirely negative.  REVIEW OF SYSTEMS:  All non-GI ROS negative except for back pain, irregular heart rate  Past Medical History  Diagnosis Date  . Hyperlipemia   . Allergic rhinitis   . Asthma   . CAD (coronary artery disease)   . Family hx of colon cancer   . Gout   . GERD (gastroesophageal reflux disease)   . Hemorrhoids   . Diverticulosis     Past Surgical History  Procedure Date  . Cardiac bypass   . Stents     Social History [name]  reports that he quit smoking about 47 years ago. He has never used smokeless tobacco. He reports that he does not drink alcohol or use illicit drugs.  family history includes Colon cancer in his brother and sister and Heart disease in his brother and father.  Allergies  Allergen Reactions  . Meperidine Hcl   . Penicillins         PHYSICAL EXAMINATION: Vital signs: BP 128/64  Pulse 58  Ht 5\' 8"  (1.727 m)  Wt 178 lb (80.74 kg)  BMI 27.06 kg/m2 General: Well-developed, well-nourished, no acute distress HEENT: Sclerae are anicteric, conjunctiva pink. Oral mucosa intact Lungs: Clear Heart: Regular Abdomen: soft, nontender, nondistended, no obvious ascites, no peritoneal signs, normal bowel sounds. No organomegaly. Extremities: No edema Psychiatric: alert and oriented x3. Cooperative    ASSESSMENT:  #1. Family history of colon cancer. Due for followup screening. Index exam 2009 with no polyps. Currently asymptomatic. #2. Multiple medical problems. Recent problems with coronary arteries requiring stent placement, now on Effient therapy #3. History of GERD and H. pylori associated duodenitis. Currently off PPI  PLAN:  #1. As the patient is asymptomatic and had no neoplasia on his index exam, I recommend that we postpone his examination until he has been on Effient therapy for one year. We discussed the pros and cons of this strategy. He agrees, but does contact the office in the interim for any relevant GI complaints. #2. Recommend coadministration of daily PPI to reduce the risk of ulcer formation and GI bleeding in a 70 year old with multiple comorbidities on Effient and aspirin. We discussed the data behind this recommendation. He agrees. #3. As such, omeprazole 20 mg daily prescribed

## 2011-07-06 NOTE — Patient Instructions (Signed)
We have sent a prescription for Prilosec to your pharmacy.  cc: Dwana Melena, MD

## 2011-07-07 ENCOUNTER — Other Ambulatory Visit: Payer: Self-pay

## 2011-07-07 ENCOUNTER — Telehealth: Payer: Self-pay

## 2011-07-07 MED ORDER — OMEPRAZOLE 20 MG PO CPDR: 20.0000 mg | DELAYED_RELEASE_CAPSULE | Freq: Every day | ORAL | Status: DC

## 2011-07-07 NOTE — Telephone Encounter (Signed)
Via fax from pharmacy patient RX needs to be sent as a 90 day supply.

## 2011-07-16 NOTE — Progress Notes (Signed)
Thanks for the notification.   HARDING,DAVID W 07/16/2011 10:37 AM

## 2012-04-11 ENCOUNTER — Other Ambulatory Visit (HOSPITAL_COMMUNITY): Payer: Self-pay | Admitting: Chiropractic Medicine

## 2012-04-11 ENCOUNTER — Ambulatory Visit (HOSPITAL_COMMUNITY)
Admission: RE | Admit: 2012-04-11 | Discharge: 2012-04-11 | Disposition: A | Discharge status: Home / Self Care | Payer: 59 | Source: Ambulatory Visit | Attending: Chiropractic Medicine | Admitting: Chiropractic Medicine

## 2012-04-11 DIAGNOSIS — M5380 Other specified dorsopathies, site unspecified: Secondary | ICD-10-CM | POA: Insufficient documentation

## 2012-04-11 DIAGNOSIS — M545 Low back pain, unspecified: Secondary | ICD-10-CM | POA: Insufficient documentation

## 2012-05-23 ENCOUNTER — Telehealth: Payer: Self-pay | Admitting: Pulmonary Disease

## 2012-05-23 NOTE — Telephone Encounter (Signed)
Spoke with pt and advised that he should check with PCP about the shingles vaccine. I advised we do not give them here and this is PCP issue.  He verbalized understanding and states nothing further needed.

## 2012-06-26 ENCOUNTER — Ambulatory Visit (INDEPENDENT_AMBULATORY_CARE_PROVIDER_SITE_OTHER): Payer: 59 | Admitting: Pulmonary Disease

## 2012-06-26 ENCOUNTER — Encounter: Payer: Self-pay | Admitting: Pulmonary Disease

## 2012-06-26 ENCOUNTER — Telehealth: Payer: Self-pay | Admitting: Pulmonary Disease

## 2012-06-26 VITALS — BP 132/72 | HR 75 | Temp 97.6°F | Ht 68.0 in | Wt 184.6 lb

## 2012-06-26 DIAGNOSIS — J45901 Unspecified asthma with (acute) exacerbation: Secondary | ICD-10-CM

## 2012-06-26 DIAGNOSIS — J45909 Unspecified asthma, uncomplicated: Secondary | ICD-10-CM

## 2012-06-26 MED ORDER — MONTELUKAST SODIUM 10 MG PO TABS: 10.0000 mg | ORAL_TABLET | Freq: Every day | ORAL | Status: DC

## 2012-06-26 MED ORDER — PREDNISONE 10 MG PO TABS: ORAL_TABLET | ORAL | Status: DC

## 2012-06-26 MED ORDER — METHYLPREDNISOLONE 16 MG PO TABS: ORAL_TABLET | ORAL | Status: DC

## 2012-06-26 NOTE — Telephone Encounter (Signed)
lmomtcb x1 on cell/home #  rx has been sent

## 2012-06-26 NOTE — Assessment & Plan Note (Signed)
The patient has symptoms that are most consistent with an acute asthma exacerbation.  He has discontinued his maintenance therapy, and I stressed to him again the importance of staying on this.  I have asked him to get back on Singulair, and we'll treat him with a course of prednisone to get through this particular episode.  Also need to see him on a yearly basis.

## 2012-06-26 NOTE — Telephone Encounter (Signed)
Can send in MEDROL 16mg  Take 2 each day for 3 days, then 1 each day for 3 days, then 1/2 each day for 3 days, then stop

## 2012-06-26 NOTE — Telephone Encounter (Signed)
Spoke with pt He states can not tolerate prednisone well- "makes me mean and feel bad" He can however, tolerate methylprednisolone and wants rx for this called in instead.  Please advise, thanks!

## 2012-06-26 NOTE — Patient Instructions (Addendum)
Get back on singulair daily. Will treat with a course of prednisone to get you thru this episode.  If you do not return to baseline, please let us know. followup with me yearly.

## 2012-06-26 NOTE — Progress Notes (Signed)
  Subjective:    Patient ID: [name], male    DOB: [birth year], 71 y.o.   MRN: [id number]  HPI The patient comes in today for an acute sick visit.  He has a history of asthma, and has been well-controlled in the past on Singulair alone.  He gives a 2 to three-week history of increasing shortness of breath, increased chest tightness with wheezing, and a mild cough with nonpurulent mucus.  He tells me that he stopped Singulair, and has been using his albuterol rescue inhaler more frequently.  He denies any fevers, chills, or sweats.  He often has a flareup with the change of seasons.   Review of Systems  Constitutional: Negative for fever and unexpected weight change.  HENT: Positive for congestion, rhinorrhea and sinus pressure. Negative for ear pain, nosebleeds, sore throat, sneezing, trouble swallowing, dental problem and postnasal drip.   Eyes: Negative for redness and itching.  Respiratory: Positive for shortness of breath ( upon activity ) and wheezing. Negative for cough and chest tightness.   Cardiovascular: Positive for leg swelling. Negative for palpitations.  Gastrointestinal: Negative for nausea and vomiting.  Genitourinary: Negative for dysuria.  Musculoskeletal: Negative for joint swelling.  Skin: Negative for rash.  Neurological: Negative for headaches.  Hematological: Does not bruise/bleed easily.  Psychiatric/Behavioral: Negative for dysphoric mood. The patient is nervous/anxious.        Objective:   Physical Exam Well-developed male in no acute distress Nose with erythematous mucosa, but no purulence Ears with normal TMs and canals. Oropharynx clear Neck without JVD or lymphadenopathy Chest with a few rhonchi, no wheezes or crackles Cardiac exam with regular rate and rhythm Lower extremities without edema, no cyanosis Alert and oriented, moves all 4 extremities.       Assessment & Plan:

## 2012-06-27 ENCOUNTER — Telehealth: Payer: Self-pay | Admitting: Pulmonary Disease

## 2012-06-27 NOTE — Telephone Encounter (Signed)
Called spoke with patient, advised of KC's recs as stated below.  Pt stated that he does not want to take the medrol 16mg  and to "just forget about it."  Advised pt that we would like to help him through this episode, and if he is going to take the 16mg  or if he would like the 4mg .  Pt reiterated "just forget it."  Will forward to [facility] to make him aware.

## 2012-06-27 NOTE — Telephone Encounter (Signed)
Offer the pt the option of taking 4mg  each day for 5 days, then stop.

## 2012-06-27 NOTE — Telephone Encounter (Signed)
LMTCB

## 2012-06-27 NOTE — Telephone Encounter (Signed)
Pt returned call.  Advised Rx for Medrol 16mg  (directions:  take 2 each day for 3 days, then 1 each day for 3 days, then 1/2 each day for 3 days, then stop) was called into [facility] in [place].  Pt verbalized understanding & stated nothing further needed. [name]

## 2012-06-27 NOTE — Telephone Encounter (Signed)
I spoke with pt and he stated he is scared to take the medrol 16 mg (per phone note 06/26/12). He stated he has only took medrol 4 mg. He thinks the medrol 16 mg may be too strong for him. He is requesting medrol 4 mg to be called in. Please advise KC thanks

## 2012-06-27 NOTE — Telephone Encounter (Signed)
It is not the size of the tablet, but the total dose that is important.  If he is talking about taking just 4mg , he might as well take a sugar pill.  Let me know what he wants to do.

## 2012-06-28 NOTE — Telephone Encounter (Signed)
[email or web address]. Jennifer Castillo, CMA  

## 2012-06-29 MED ORDER — METHYLPREDNISOLONE 4 MG PO TABS: 4.0000 mg | ORAL_TABLET | Freq: Every day | ORAL | Status: DC

## 2012-06-29 NOTE — Telephone Encounter (Signed)
Called spoke with patient, offered the medrol 4mg  daily x5 days.  Pt appreciates this rx and verbalized his understanding.  Pt aware to call back or seek emergency assistance if his symptoms do not improve or worsen.  Rx telephoned to verified pharmacy > left on voicemail and informed pharmacy that the rx for the medrol 16mg  sent on 10.22 is to be discontinued.  Nothing further needed at this time; will sign off.

## 2012-07-03 ENCOUNTER — Telehealth: Payer: Self-pay | Admitting: Pulmonary Disease

## 2012-07-03 NOTE — Telephone Encounter (Signed)
Pt was seen by Dr. Delton Coombes at 3:45 today

## 2013-02-18 ENCOUNTER — Other Ambulatory Visit: Payer: Self-pay | Admitting: Cardiology

## 2013-02-18 LAB — COMPREHENSIVE METABOLIC PANEL WITH GFR
ALT: 23 U/L (ref 0–53)
AST: 22 U/L (ref 0–37)
Albumin: 4.3 g/dL (ref 3.5–5.2)
Alkaline Phosphatase: 60 U/L (ref 39–117)
BUN: 13 mg/dL (ref 6–23)
CO2: 27 meq/L (ref 19–32)
Calcium: 9.2 mg/dL (ref 8.4–10.5)
Chloride: 104 meq/L (ref 96–112)
Creat: 0.96 mg/dL (ref 0.50–1.35)
Glucose, Bld: 94 mg/dL (ref 70–99)
Potassium: 4.7 meq/L (ref 3.5–5.3)
Sodium: 140 meq/L (ref 135–145)
Total Bilirubin: 0.9 mg/dL (ref 0.3–1.2)
Total Protein: 6.4 g/dL (ref 6.0–8.3)

## 2013-02-18 LAB — LIPID PANEL
Cholesterol: 155 mg/dL (ref 0–200)
HDL: 40 mg/dL
LDL Cholesterol: 101 mg/dL — ABNORMAL HIGH (ref 0–99)
Total CHOL/HDL Ratio: 3.9 ratio
Triglycerides: 71 mg/dL
VLDL: 14 mg/dL (ref 0–40)

## 2013-02-20 ENCOUNTER — Ambulatory Visit (INDEPENDENT_AMBULATORY_CARE_PROVIDER_SITE_OTHER): Payer: 59 | Admitting: Cardiology

## 2013-02-20 ENCOUNTER — Encounter: Payer: Self-pay | Admitting: Cardiology

## 2013-02-20 VITALS — BP 130/80 | Ht 68.0 in | Wt 187.5 lb

## 2013-02-20 DIAGNOSIS — I2510 Atherosclerotic heart disease of native coronary artery without angina pectoris: Secondary | ICD-10-CM

## 2013-02-20 DIAGNOSIS — I4949 Other premature depolarization: Secondary | ICD-10-CM

## 2013-02-20 DIAGNOSIS — G4733 Obstructive sleep apnea (adult) (pediatric): Secondary | ICD-10-CM

## 2013-02-20 DIAGNOSIS — J4541 Moderate persistent asthma with (acute) exacerbation: Secondary | ICD-10-CM

## 2013-02-20 DIAGNOSIS — I493 Ventricular premature depolarization: Secondary | ICD-10-CM | POA: Insufficient documentation

## 2013-02-20 DIAGNOSIS — E785 Hyperlipidemia, unspecified: Secondary | ICD-10-CM

## 2013-02-20 DIAGNOSIS — J45901 Unspecified asthma with (acute) exacerbation: Secondary | ICD-10-CM

## 2013-02-20 DIAGNOSIS — I25810 Atherosclerosis of coronary artery bypass graft(s) without angina pectoris: Secondary | ICD-10-CM

## 2013-02-20 DIAGNOSIS — Z87898 Personal history of other specified conditions: Secondary | ICD-10-CM

## 2013-02-20 NOTE — Patient Instructions (Addendum)
Overall from our perspective he seemed to pretty well. Your PVCs are much better controlled on the low dose of carvedilol/Coreg. Your blood pressure is doing better not as low as it was before which is probably why her fingers disease he were.  I would recommend ahead and taking the steroid taper that your pulmonologist gave you. It'll help her breathing.  I want to encouraged to the level of activity with 2 more steps or stairs maybe trying a walking is a week. This will probably help her energy level overall. Since you're having the itching issue with Plavix, we discussed doing every other day aspirin and Plavix, Vasotec and aspirin I would take 2 baby aspirin and then didn't take Plavix alternating. It he did take it today break off of Plavix that's fine as well. I just would not hold it for more than 4-5 days a time.  We will see you back in 6 months. He'll continue to well. Please call us if her condition were to change.  Marykay Lex, MD

## 2013-02-22 ENCOUNTER — Encounter: Payer: Self-pay | Admitting: Cardiovascular Disease

## 2013-02-23 ENCOUNTER — Encounter: Payer: Self-pay | Admitting: Cardiology

## 2013-02-23 DIAGNOSIS — I25810 Atherosclerosis of coronary artery bypass graft(s) without angina pectoris: Secondary | ICD-10-CM | POA: Insufficient documentation

## 2013-02-23 NOTE — Assessment & Plan Note (Signed)
On Vytorin, monitored by his primary care physician.

## 2013-02-23 NOTE — Progress Notes (Signed)
Patient ID: [name], male   DOB: 1941/01/29, 72 y.o.   MRN: [id number]  Clinic Note: HPI: [name] is a 72 y.o. male with a PMH below who presents today for routine followup. He is a gentleman I took over primary cardiologist Dr. been a patient of Dr. Kandis Cocking. I ended up seeing him in taking him in for cardiac catheterization that resulted in a staged PCI of his RCA and circumflex-OM. This revascularized two over three grafted areas. His main symptoms he noted that time with increasing frequency of PVCs as well as fatigue. Of note he is very sensitive to medications and is really only intolerant a very low dose medications. Is a very low dose of beta blocker, and is personally decided to back off on his Vytorin 1 tablet daily. We switched him over from Effient the Plavix after the last visit. He has a lot of itching associated with it but when he does his predominant take a day or 2 off of Plavix and then goes back to it.Marland Kitchen He still is going in to work, and with that his is not getting as much a heavy exercise he used to do. 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  Interval History: He relatively well for cardiac standpoint at. He denies any anginal chest discomfort for significant PVCs or PACs or arrhythmias. He does still a bit short of breath when going up and downstairs, but he continues to his asthma. He is recently taken a course of prednisone that has made a significant improvement of his symptoms. The most part he denies any true anginal chest pain with rest or exertion. He denies any PND orthopnea only mild edema. He denies any frequent PVCs her heart skipping. No significant near-syncope. No TIA, or amaurosis fugax symptoms. No melena, hematochezia hematuria. He is actually doing overall fairly well Stier for just under 6 month followup.  Past Medical History  Diagnosis Date  . Hyperlipemia   . Allergic rhinitis   . Asthma   . CAD (coronary artery disease)     NUCLEAR STRESS  TEST, 04/07/2011 - new ischemia may be present, concerning for multivessel distribution disease and ischemic ectopy  . Family hx of colon cancer   . Gout   . GERD (gastroesophageal reflux disease)   . Hemorrhoids   . Diverticulosis   . Hypertension   . Erectile dysfunction   . History of nephrolithiasis   . Asthmatic bronchitis   . Polycythemia   . CAS (cerebral atherosclerosis)     CAROTID DOPPLER, 08/25/2011 - mildly abnormal  . Syncope     2D ECHO, 07/13/2009 - EF >55%, normal    Prior Cardiac Evaluation and Past Surgical History: Past Surgical History  Procedure Laterality Date  . Coronary artery bypass graft  07/23/2004    LIMA-circumflex, RIMA-LAD, SVG-diagonal,   . Cardiac catheterization  04/26/2011    Patent RIMA-LAD, atretic LIMA-circumflex (previous) 100 and occluded SVG to diagonal and SVG to PDA. LAD 80-90% eccentric stenosis at SP1 that has 70% stenosis. Circumflex: Large OM and another distal branch with 90-95% stenosis followed by lesion in the OM branch. RCA dominant 6% lesion proximally and several old lesions. PDA occluded 70-80% annular lesion in the proximal RCA. 3 of 4 grafts occlu  . Coronary angioplasty with stent placement Bilateral 04/28/2011    PCI-circumflex OM stented with 2 overlapping Resolute DES 3x26mm and 3x20mm stents post dilated to 3.44mm; PCI to RCA stented with a Promus  DES 3x68mm stent dilated to 3.62mm  . Cardiac catheterization  07/15/2004    CABG recommended; continue medical therapy    Allergies  Allergen Reactions  . Meperidine Hcl   . Penicillins     Current Outpatient Prescriptions  Medication Sig Dispense Refill  . allopurinol (ZYLOPRIM) 300 MG tablet Take 300 mg by mouth daily.        Marland Kitchen ALPRAZolam (XANAX) 0.5 MG tablet As needed       . carvedilol (COREG) 3.125 MG tablet Take 1/2 tablet once a day      . clopidogrel (PLAVIX) 75 MG tablet Take 75 mg by mouth daily.       . mometasone (NASONEX) 50 MCG/ACT nasal spray Place 2 sprays  into the nose daily.        . montelukast (SINGULAIR) 10 MG tablet Take 1 tablet (10 mg total) by mouth at bedtime.  90 tablet  4  . predniSONE (DELTASONE) 10 MG tablet Take 4 tablets x 2 days then 3 tablets x 2 days then 2 tablets x 2 days then 1 tablet x 2 days then stop .  20 tablet  0  . SAFETY-LOK 3CC SYR 25GX5/8" 25G X 5/8" 3 ML MISC       . testosterone cypionate (DEPOTESTOTERONE CYPIONATE) 200 MG/ML injection Inject 200 mg into the muscle every 14 (fourteen) days. As directed takes 1/48ml every 2 weeks      . VYTORIN 10-40 MG per tablet 1/2 tablet by mouth once daily        No current facility-administered medications for this visit.    History   Social History  . Marital Status: Married    Spouse Name: N/A    Number of Children: N/A  . Years of Education: N/A   Occupational History  . MANAGER [employer]   Social History Main Topics  . Smoking status: Former Smoker -- 1.00 packs/day for 7 years    Types: Cigarettes    Quit date: 09/06/1963  . Smokeless tobacco: Never Used  . Alcohol Use: No  . Drug Use: No  . Sexually Active: Not on file   Other Topics Concern  . Not on file   Social History Narrative  . No narrative on file    ROS: A comprehensive Review of Systems - Negative except Pertinent positives noted above as well as other positives below. Allergy and Immunology ROS: positive for - seasonal allergies Respiratory ROS: positive for - shortness of breath and wheezing negative for - cough, hemoptysis, orthopnea, pleuritic pain, sputum changes, stridor or tachypnea Genito-Urinary ROS: positive for - urinary frequency/urgency and Extreme difficulty with voiding due to BPH. Has to be standing up for urination. negative for - change in urinary stream, dysuria, hematuria, incontinence or urinary frequency/urgency He also notes overall he feels he is gaining weight even though he is 3 pounds under his weight from last visit. The bruising is better with Plavix then with  Effient but she still has significant bruising.  PHYSICAL EXAM BP 130/80  Ht 5\' 8"  (1.727 m)  Wt 187 lb 8 oz (85.049 kg)  BMI 28.52 kg/m2 General appearance: alert, cooperative, appears stated age, no distress, mildly obese and Pleasant mood and affect, well-nourished and well-groomed. Neck: no adenopathy, no carotid bruit, no JVD, supple, symmetrical, trachea midline and thyroid not enlarged, symmetric, no tenderness/mass/nodules Lungs: clear to auscultation bilaterally, normal percussion bilaterally and Nonlabored, good air movement. Heart: regular rate and rhythm, S1, S2 normal, S4 present and systolic  murmur: systolic ejection 1/6, medium pitch, crescendo, decrescendo and harsh radiates to carotids Abdomen: soft, non-tender; bowel sounds normal; no masses,  no organomegaly Extremities: extremities normal, atraumatic, no cyanosis or edema Pulses: 2+ and symmetric Neurologic: Grossly normal  OZH:YQMVHQION today: Yes Rate: 84 , Rhythm: Normal sinus rhythm;  Conduction Delay nonspecific interventricular conduction delay, 1 PVC in 2 minutes of her the trip, left anterior fascicular block.;   Recent Labs:  ASSESSMENT: Overall, relatively stable from a cardiac standpoint. Improved PVCs. No active anginal symptoms. No heart failure symptoms. His dyspnea seems to be much improved after steroid taper.  Since he continues to have the itching issue with Plavix, we discussed doing every other day aspirin 2 baby tablets and Plavix.  I would prefer to take it daily, but it is fine to hold every now and then, just as long as he does not not hold it for more than 4-5 days a time.  CORONARY ARTERY DISEASE - Plan: EKG 12-Lead  HYPERLIPIDEMIA  PVC's (premature ventricular contractions) - Plan: EKG 12-Lead  CAD (coronary artery disease) of artery bypass graft -atretic LIMA- circumflex, occluded SVG-RCA and SVG-diagonal.  Intrinsic asthma with exacerbation, moderate persistent  PLAN: Per problem  list.   Followup: 6 months  HARDING,DAVID W, M.D., M.S. [facility] [address] [place], [place]  [address]  [phone] Pager # [phone] 02/23/2013 11:53 PM

## 2013-02-23 NOTE — Assessment & Plan Note (Signed)
I think a lot of his discomfort may indeed be more due to that currently. He seems to do better with the prednisone taper. Unfortunately he does not do well at the high dose of prednisone to taper either.

## 2013-02-23 NOTE — Assessment & Plan Note (Signed)
Cc relatively stable the patient anginal symptoms currently. His PVCs are improved. Continue his beta blocker, Vytorin as well as Plavix and aspirin interchanged as described. Would like for him to stay on Plavix and the due to the extent of stented. But if he chooses to hold the Plavix for a couple days was 1:30 days he should be okay.

## 2013-02-27 ENCOUNTER — Ambulatory Visit: Payer: 59 | Admitting: Cardiology

## 2013-06-24 ENCOUNTER — Ambulatory Visit: Payer: 59 | Admitting: Pulmonary Disease

## 2013-07-05 ENCOUNTER — Other Ambulatory Visit: Payer: Self-pay | Admitting: Cardiology

## 2013-07-08 NOTE — Telephone Encounter (Signed)
Rx was sent to pharmacy electronically. 

## 2013-08-06 ENCOUNTER — Telehealth: Payer: Self-pay | Admitting: *Deleted

## 2013-08-06 DIAGNOSIS — E782 Mixed hyperlipidemia: Secondary | ICD-10-CM

## 2013-08-06 DIAGNOSIS — Z79899 Other long term (current) drug therapy: Secondary | ICD-10-CM

## 2013-08-06 NOTE — Telephone Encounter (Signed)
We can order CMP & Lipids prior to f/u.  Marykay Lex, MD

## 2013-08-06 NOTE — Telephone Encounter (Signed)
No labs ordered at last visit.  Message forwarded to Dr. Herbie Baltimore for further instructions.

## 2013-08-06 NOTE — Telephone Encounter (Signed)
Pt called to make and appointment and he stated that he needs a lab slip mailed to him so that he can have his blood work drawn before his appointment on Dec 17th

## 2013-08-06 NOTE — Telephone Encounter (Signed)
Returned call.  Left message w/o pt-identifying info that labs ordered and slip mailed.  Go to [facility] lab fasting if not received prior to appt and to call back before 4pm.

## 2013-08-14 LAB — COMPREHENSIVE METABOLIC PANEL
ALT: 25 U/L (ref 0–53)
Albumin: 4.5 g/dL (ref 3.5–5.2)
Alkaline Phosphatase: 69 U/L (ref 39–117)
CO2: 30 mEq/L (ref 19–32)
Glucose, Bld: 101 mg/dL — ABNORMAL HIGH (ref 70–99)
Potassium: 4.6 mEq/L (ref 3.5–5.3)
Sodium: 138 mEq/L (ref 135–145)
Total Bilirubin: 0.9 mg/dL (ref 0.3–1.2)
Total Protein: 6.4 g/dL (ref 6.0–8.3)

## 2013-08-14 LAB — LIPID PANEL
Cholesterol: 167 mg/dL (ref 0–200)
LDL Cholesterol: 98 mg/dL (ref 0–99)
Triglycerides: 99 mg/dL (ref <150)

## 2013-08-15 ENCOUNTER — Telehealth: Payer: Self-pay | Admitting: *Deleted

## 2013-08-15 DIAGNOSIS — Z79899 Other long term (current) drug therapy: Secondary | ICD-10-CM

## 2013-08-15 DIAGNOSIS — E782 Mixed hyperlipidemia: Secondary | ICD-10-CM

## 2013-08-15 NOTE — Telephone Encounter (Signed)
Message copied by Tobin Chad on Thu Aug 15, 2013  4:18 PM ------      Message from: Marykay Lex      Created: Wed Aug 14, 2013 10:50 PM       Overall, essentially stable lab values. Lipids are about the same. HDL has gone up a little bit and LDL has gone down a little bit. This but in the right direction I would like to see the LDL down below 70 however. We can check this again in about 6 months and see if we're getting appreciable effect if not we may want to consider increasing the potency of the statin in addition to the Zetia portion of Vytorin.            Marykay Lex, MD ------

## 2013-08-15 NOTE — Telephone Encounter (Signed)
Spoke to patient's wife. Result given . Verbalized understanding. Placed labslip--CMP,LIPIDS for 6 months to redraw. Will mail to patient. Wife is aware.

## 2013-08-21 ENCOUNTER — Ambulatory Visit (INDEPENDENT_AMBULATORY_CARE_PROVIDER_SITE_OTHER): Payer: 59 | Admitting: Cardiology

## 2013-08-21 ENCOUNTER — Encounter: Payer: Self-pay | Admitting: Cardiology

## 2013-08-21 VITALS — BP 146/82 | HR 73 | Ht 68.0 in | Wt 189.0 lb

## 2013-08-21 DIAGNOSIS — E782 Mixed hyperlipidemia: Secondary | ICD-10-CM

## 2013-08-21 DIAGNOSIS — I25810 Atherosclerosis of coronary artery bypass graft(s) without angina pectoris: Secondary | ICD-10-CM

## 2013-08-21 DIAGNOSIS — Z9861 Coronary angioplasty status: Secondary | ICD-10-CM

## 2013-08-21 DIAGNOSIS — Z79899 Other long term (current) drug therapy: Secondary | ICD-10-CM

## 2013-08-21 DIAGNOSIS — I493 Ventricular premature depolarization: Secondary | ICD-10-CM

## 2013-08-21 DIAGNOSIS — I2510 Atherosclerotic heart disease of native coronary artery without angina pectoris: Secondary | ICD-10-CM

## 2013-08-21 DIAGNOSIS — G4733 Obstructive sleep apnea (adult) (pediatric): Secondary | ICD-10-CM

## 2013-08-21 DIAGNOSIS — I4949 Other premature depolarization: Secondary | ICD-10-CM

## 2013-08-21 DIAGNOSIS — E785 Hyperlipidemia, unspecified: Secondary | ICD-10-CM

## 2013-08-21 NOTE — Progress Notes (Signed)
[facility]   PCP: [name], MD  Clinic Note: Chief Complaint  Patient presents with  . 6 month visit    labs done12/10/14;  no chest pain , sob--asthma -coughing and spittng up ,no edema   HPI: [name] is a 72 y.o. male with a Cardiovascular Problem List below who presents today for six-month followup.  Interval History: She comes in today he, in great spirits. He says he feels good. This is the best he's felt in a followup visit for years. He seems to be a lot less stressed. He denies any notable episodes of angina with any exertion. He does not have any with rest. No PND, orthopnea or edema. He does occasionally have some spells where he feels some increased PVCs it is under stress, but is able sit down and relax these will calm down. The PVCs are otherwise significantly reduced. He denies any rapid rhythms. No lightheadedness, dizziness or wooziness. No significant near-syncope. No TIA or amaurosis fugax symptoms. No heart failure symptoms such as PND, orthopnea or edema.  No melena, hematochezia or hematuria. No claudication symptoms. He is taking aspirin and Plavix on alternating days think is taking Plavix every day and didn't change anything for head ears.   Patient Active Problem List   Diagnosis Date Noted  . CAD (coronary artery disease) of artery bypass graft -atretic LIMA- circumflex, occluded SVG-RCA and SVG-diagonal. 02/23/2013    Priority: High  . CAD S/P percutaneous coronary angioplasty - PCI of Cx-OM w/ 2 Resolute DES 3.20mm x 15 mm & 3.0 mm x 18mm  (3.9mm); PCI-RCA (Promus Element DES 3.0 mm x 38 mm (3.75mm) 04/28/2011    Priority: High  . BENIGN PROSTATIC HYPERTROPHY, HX OF 07/09/2008    Priority: High  . INTRINSIC ASTHMA, WITH EXACERBATION 07/14/2010    Priority: Medium  . HYPERLIPIDEMIA 07/09/2008    Priority: Medium  . OBSTRUCTIVE SLEEP APNEA 07/09/2008    Priority: Medium  . PVC's (premature ventricular contractions) 02/20/2013    Priority: Low  .  Encounter for long-term (current) use of other medications 08/22/2013  . Hyperlipidemia 08/22/2013   PMH Reviewed on Epic Prior Cardiac Evaluation and Past Surgical History: Past Surgical History  Procedure Laterality Date  . Coronary artery bypass graft  07/23/2004    LIMA-circumflex, RIMA-LAD, SVG-diagonal,   . Cardiac catheterization  04/26/2011    Patent RIMA-LAD, atretic LIMA-circumflex (previous) 100 and occluded SVG to diagonal and SVG to PDA. LAD 80-90% eccentric stenosis at SP1 that has 70% stenosis. Circumflex: Large OM and another distal branch with 90-95% stenosis followed by lesion in the OM branch. RCA dominant 6% lesion proximally and several old lesions. PDA occluded 70-80% annular lesion in the proximal RCA. 3 of 4 grafts occlu  . Coronary angioplasty with stent placement Bilateral 04/28/2011    PCI-circumflex OM stented with 2 overlapping Resolute DES 3x79mm and 3x53mm stents post dilated to 3.40mm; PCI to RCA stented with a Promus DES 3x65mm stent dilated to 3.24mm  . Cardiac catheterization  07/15/2004    CABG recommended; continue medical therapy    Allergies  Allergen Reactions  . Meperidine Hcl   . Penicillins    MEDICATIONS REVIEWED IN EPIC  History   Social History Narrative   He is made to patient of mine. He works for [employer], as a Warden/ranger. He is a former smoker but quit in 1965 after 7 years. He does not drink alcohol.   He is very busy with work, and does not  get routine exercise. He plans to work maybe one more year and then will retire. 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   ROS: A comprehensive Review of Systems - Negative except Still having intermittent reactive airway disease/allergic rhinitis issues.; Mild arthralgias. He still has itching on his for head and a size years when he takes Plavix.   PHYSICAL EXAM BP 146/82  Pulse 73  Ht 5\' 8"  (1.727 m)  Wt 189  lb (85.73 kg)  BMI 28.74 kg/m2 General appearance: alert and oriented x3, cooperative, appears stated age, no distress and borderline obese Neck: no adenopathy, no carotid bruit and no JVD HEENT: [place]/AT, EOMI, MMM, anicteric sclera Lungs: clear to auscultation bilaterally, normal percussion bilaterally and non-labored Heart: RRR, S1, S2 normal,; S4 present. Soft 1/6 SEM crescendo -decrescendo at the RUSB radiating to carotids. Abdomen: soft, non-tender; bowel sounds normal; no masses,  no organomegaly Extremities: extremities normal, atraumatic, no cyanosis or edema. Pulses: 2+ and symmetric; Skin: normal  Neurologic: Mental status: Alert, oriented, thought content appropriate  VHQ:IONGEXBMW today: Yes Rate: 73 , Rhythm: NSR, 1 PVC, T wave inversions in lead 3.  Recent Labs 08/14/2013:   TC 167, TG 99, HDL 49, LDL 98. Glucose 101. Potassium 4.6, creatinine 0.97. Remainder essentially normal.  ASSESSMENT / PLAN: CAD S/P percutaneous coronary angioplasty - PCI of Cx-OM w/ 2 Resolute DES 3.2mm x 15 mm & 3.0 mm x 18mm  (3.10mm); PCI-RCA (Promus Element DES 3.0 mm x 38 mm (3.31mm) Essentially revascularized RCA and circumflex/OM after graft failure. He does have extensive DES stents, and is therefore on antiplatelet therapy. He should be getting enough coverage with every other day Plavix, and is not having any significant bleeding. He is on statin (Vytorin) as well as a very low dose of beta blocker to his thumb able tolerate. He takes one quarter of the tablet in the morning one half in the evening.  He seems to be about stable from a cardiac standpoint as he has ever been. We will continue the current record.  PVC's (premature ventricular contractions) PVCs seemingly better control than they used to be. He is on a minimal dose of beta blocker, because any higher doses than this he just feels extremely lethargic and fatigued.  Hyperlipidemia He is on Vytorin. His been monitored by his PCP.  His labs look pretty good from this week with LDL not quite at goal but improved overall. His glucose level is a little on the high side, but stable. For now we'll continue with Vytorin, however his goal is LDL less than 70. Hopefully when he stops working he is able to get more exercise that can help lower his lipids even further.  Encounter for long-term (current) use of other medications Labs were checked to followup lipids and LFTs while on statin.  OBSTRUCTIVE SLEEP APNEA He seems to be doing well on his CPAP, and has Sleeping better. Therefore feeling more rested.    Orders Placed This Encounter  Procedures  . EKG 12-Lead   Refills: Meds ordered this encounter  Medications  . aspirin EC 81 MG tablet    Sig: Take 81 mg by mouth every other day.  . clopidogrel (PLAVIX) 75 MG tablet    Sig: take 1 tablet every other day    Followup: 6 months  Thelmer Legler W. Herbie Baltimore, M.D., M.S. [facility],  [facility] [address] [place],  [place]  [address]  [phone] Pager # [phone]

## 2013-08-21 NOTE — Patient Instructions (Signed)
Your physician wants you to follow-up in 12 months with Dr Herbie Baltimore--  6 month need labs will mail labslip.  You will receive a reminder letter in the mail two months in advance. If you don't receive a letter, please call our office to schedule the follow-up appointment.  No change in medication, continue with current medication

## 2013-08-22 ENCOUNTER — Encounter: Payer: Self-pay | Admitting: Cardiology

## 2013-08-22 DIAGNOSIS — Z79899 Other long term (current) drug therapy: Secondary | ICD-10-CM | POA: Insufficient documentation

## 2013-08-22 DIAGNOSIS — E785 Hyperlipidemia, unspecified: Secondary | ICD-10-CM | POA: Insufficient documentation

## 2013-08-22 NOTE — Assessment & Plan Note (Signed)
PVCs seemingly better control than they used to be. He is on a minimal dose of beta blocker, because any higher doses than this he just feels extremely lethargic and fatigued.

## 2013-08-22 NOTE — Assessment & Plan Note (Signed)
He is on Vytorin. His been monitored by his PCP. His labs look pretty good from this week with LDL not quite at goal but improved overall. His glucose level is a little on the high side, but stable. For now we'll continue with Vytorin, however his goal is LDL less than 70. Hopefully when he stops working he is able to get more exercise that can help lower his lipids even further.

## 2013-08-22 NOTE — Assessment & Plan Note (Signed)
He seems to be doing well on his CPAP, and has Sleeping better. Therefore feeling more rested.

## 2013-08-22 NOTE — Assessment & Plan Note (Signed)
Labs were checked to followup lipids and LFTs while on statin.

## 2013-08-22 NOTE — Assessment & Plan Note (Signed)
Essentially revascularized RCA and circumflex/OM after graft failure. He does have extensive DES stents, and is therefore on antiplatelet therapy. He should be getting enough coverage with every other day Plavix, and is not having any significant bleeding. He is on statin (Vytorin) as well as a very low dose of beta blocker to his thumb able tolerate. He takes one quarter of the tablet in the morning one half in the evening.  He seems to be about stable from a cardiac standpoint as he has ever been. We will continue the current record.

## 2013-12-06 ENCOUNTER — Ambulatory Visit (HOSPITAL_COMMUNITY)
Admission: RE | Admit: 2013-12-06 | Discharge: 2013-12-06 | Disposition: A | Discharge status: Home / Self Care | Payer: 59 | Source: Ambulatory Visit | Attending: Internal Medicine | Admitting: Internal Medicine

## 2013-12-06 ENCOUNTER — Other Ambulatory Visit (HOSPITAL_COMMUNITY): Payer: Self-pay | Admitting: Internal Medicine

## 2013-12-06 DIAGNOSIS — R0602 Shortness of breath: Secondary | ICD-10-CM | POA: Insufficient documentation

## 2013-12-06 DIAGNOSIS — R062 Wheezing: Secondary | ICD-10-CM

## 2014-01-10 ENCOUNTER — Telehealth: Payer: Self-pay | Admitting: *Deleted

## 2014-01-10 DIAGNOSIS — E782 Mixed hyperlipidemia: Secondary | ICD-10-CM

## 2014-01-10 DIAGNOSIS — Z79899 Other long term (current) drug therapy: Secondary | ICD-10-CM

## 2014-01-10 NOTE — Telephone Encounter (Signed)
Message copied by Raiford Simmonds on Fri Jan 10, 2014  8:12 AM ------      Message from: [place], [place]: Thu Aug 15, 2013  4:26 PM       LABSLIP CMP,LIPIDS  MAIL IN MAY 2015            FROM RESULTS 08/15/13 ------

## 2014-01-10 NOTE — Telephone Encounter (Signed)
Mailed letter and labslip cmp  ,lipid 

## 2014-02-25 ENCOUNTER — Telehealth: Payer: Self-pay | Admitting: Cardiology

## 2014-02-25 NOTE — Telephone Encounter (Signed)
Pt wants to know if [name] received his lab results from about a week and a half ago? Also does he need any other lab work?

## 2014-02-25 NOTE — Telephone Encounter (Signed)
Pt. Wants to know if you received his labs from a week ago and does he need to do anything else

## 2014-02-26 ENCOUNTER — Telehealth: Payer: Self-pay | Admitting: *Deleted

## 2014-02-26 NOTE — Telephone Encounter (Signed)
Notified wife. Labs where received and Dr Ellyn Hack does not require anymore labs at present time. She verbalized understanding.

## 2014-03-06 ENCOUNTER — Emergency Department (HOSPITAL_COMMUNITY): Payer: 59

## 2014-03-06 ENCOUNTER — Encounter (HOSPITAL_COMMUNITY): Payer: Self-pay | Admitting: Emergency Medicine

## 2014-03-06 ENCOUNTER — Emergency Department (HOSPITAL_COMMUNITY)
Admission: EM | Admit: 2014-03-06 | Discharge: 2014-03-06 | Disposition: A | Discharge status: Home / Self Care | Payer: 59 | Attending: Emergency Medicine | Admitting: Emergency Medicine

## 2014-03-06 DIAGNOSIS — J45909 Unspecified asthma, uncomplicated: Secondary | ICD-10-CM | POA: Insufficient documentation

## 2014-03-06 DIAGNOSIS — Z79899 Other long term (current) drug therapy: Secondary | ICD-10-CM | POA: Insufficient documentation

## 2014-03-06 DIAGNOSIS — I10 Essential (primary) hypertension: Secondary | ICD-10-CM | POA: Insufficient documentation

## 2014-03-06 DIAGNOSIS — IMO0002 Reserved for concepts with insufficient information to code with codable children: Secondary | ICD-10-CM | POA: Insufficient documentation

## 2014-03-06 DIAGNOSIS — Z9861 Coronary angioplasty status: Secondary | ICD-10-CM | POA: Insufficient documentation

## 2014-03-06 DIAGNOSIS — Z862 Personal history of diseases of the blood and blood-forming organs and certain disorders involving the immune mechanism: Secondary | ICD-10-CM | POA: Insufficient documentation

## 2014-03-06 DIAGNOSIS — Z7902 Long term (current) use of antithrombotics/antiplatelets: Secondary | ICD-10-CM | POA: Insufficient documentation

## 2014-03-06 DIAGNOSIS — Z880 Allergy status to penicillin: Secondary | ICD-10-CM | POA: Insufficient documentation

## 2014-03-06 DIAGNOSIS — N201 Calculus of ureter: Secondary | ICD-10-CM | POA: Insufficient documentation

## 2014-03-06 DIAGNOSIS — Z9889 Other specified postprocedural states: Secondary | ICD-10-CM | POA: Insufficient documentation

## 2014-03-06 DIAGNOSIS — E785 Hyperlipidemia, unspecified: Secondary | ICD-10-CM | POA: Insufficient documentation

## 2014-03-06 DIAGNOSIS — Z951 Presence of aortocoronary bypass graft: Secondary | ICD-10-CM | POA: Insufficient documentation

## 2014-03-06 DIAGNOSIS — I2510 Atherosclerotic heart disease of native coronary artery without angina pectoris: Secondary | ICD-10-CM | POA: Insufficient documentation

## 2014-03-06 DIAGNOSIS — Z7982 Long term (current) use of aspirin: Secondary | ICD-10-CM | POA: Insufficient documentation

## 2014-03-06 DIAGNOSIS — Z8719 Personal history of other diseases of the digestive system: Secondary | ICD-10-CM | POA: Insufficient documentation

## 2014-03-06 DIAGNOSIS — Z87891 Personal history of nicotine dependence: Secondary | ICD-10-CM | POA: Insufficient documentation

## 2014-03-06 LAB — CBC WITH DIFFERENTIAL/PLATELET
Basophils Absolute: 0 10*3/uL (ref 0.0–0.1)
Basophils Relative: 0 % (ref 0–1)
EOS ABS: 0.1 10*3/uL (ref 0.0–0.7)
Eosinophils Relative: 0 % (ref 0–5)
HCT: 52.1 % — ABNORMAL HIGH (ref 39.0–52.0)
HEMOGLOBIN: 18 g/dL — AB (ref 13.0–17.0)
LYMPHS ABS: 1.3 10*3/uL (ref 0.7–4.0)
Lymphocytes Relative: 6 % — ABNORMAL LOW (ref 12–46)
MCH: 29.2 pg (ref 26.0–34.0)
MCHC: 34.5 g/dL (ref 30.0–36.0)
MCV: 84.6 fL (ref 78.0–100.0)
MONOS PCT: 2 % — AB (ref 3–12)
Monocytes Absolute: 0.6 10*3/uL (ref 0.1–1.0)
Neutro Abs: 21.9 10*3/uL — ABNORMAL HIGH (ref 1.7–7.7)
Neutrophils Relative %: 92 % — ABNORMAL HIGH (ref 43–77)
PLATELETS: 226 10*3/uL (ref 150–400)
RBC: 6.16 MIL/uL — AB (ref 4.22–5.81)
RDW: 13.9 % (ref 11.5–15.5)
WBC: 23.9 10*3/uL — AB (ref 4.0–10.5)

## 2014-03-06 LAB — URINALYSIS, ROUTINE W REFLEX MICROSCOPIC
BILIRUBIN URINE: NEGATIVE
GLUCOSE, UA: NEGATIVE mg/dL
LEUKOCYTES UA: NEGATIVE
Nitrite: NEGATIVE
PH: 7 (ref 5.0–8.0)
Protein, ur: 100 mg/dL — AB
Specific Gravity, Urine: 1.02 (ref 1.005–1.030)
Urobilinogen, UA: 1 mg/dL (ref 0.0–1.0)

## 2014-03-06 LAB — URINE MICROSCOPIC-ADD ON

## 2014-03-06 LAB — BASIC METABOLIC PANEL
Anion gap: 17 — ABNORMAL HIGH (ref 5–15)
BUN: 15 mg/dL (ref 6–23)
CO2: 23 meq/L (ref 19–32)
Calcium: 9.6 mg/dL (ref 8.4–10.5)
Chloride: 98 mEq/L (ref 96–112)
Creatinine, Ser: 1.1 mg/dL (ref 0.50–1.35)
GFR calc Af Amer: 74 mL/min — ABNORMAL LOW (ref >90)
GFR, EST NON AFRICAN AMERICAN: 64 mL/min — AB (ref >90)
GLUCOSE: 148 mg/dL — AB (ref 70–99)
POTASSIUM: 4.5 meq/L (ref 3.7–5.3)
Sodium: 138 mEq/L (ref 137–147)

## 2014-03-06 MED ORDER — SODIUM CHLORIDE 0.9 % IV BOLUS (SEPSIS)
500.0000 mL | Freq: Once | INTRAVENOUS | Status: AC
  Administered 2014-03-06: 500 mL via INTRAVENOUS

## 2014-03-06 MED ORDER — ONDANSETRON 4 MG PO TBDP: 4.0000 mg | ORAL_TABLET | Freq: Three times a day (TID) | ORAL | Status: DC | PRN

## 2014-03-06 MED ORDER — OXYCODONE-ACETAMINOPHEN 5-325 MG PO TABS: 1.0000 | ORAL_TABLET | Freq: Four times a day (QID) | ORAL | Status: DC | PRN

## 2014-03-06 MED ORDER — ONDANSETRON HCL 4 MG/2ML IJ SOLN
4.0000 mg | Freq: Once | INTRAMUSCULAR | Status: AC
  Administered 2014-03-06: 4 mg via INTRAVENOUS
  Filled 2014-03-06: qty 2

## 2014-03-06 MED ORDER — SODIUM CHLORIDE 0.9 % IV SOLN
INTRAVENOUS | Status: DC
  Administered 2014-03-06: 500 mL via INTRAVENOUS

## 2014-03-06 MED ORDER — HYDROMORPHONE HCL PF 1 MG/ML IJ SOLN
1.0000 mg | Freq: Once | INTRAMUSCULAR | Status: AC
  Administered 2014-03-06: 1 mg via INTRAVENOUS
  Filled 2014-03-06: qty 1

## 2014-03-06 NOTE — ED Notes (Signed)
Pain rt flank for 2 hours, hx of kidney stones.  N/V

## 2014-03-06 NOTE — Discharge Instructions (Signed)
Kidney Stones Kidney stones (urolithiasis) are solid masses that form inside your kidneys. The intense pain is caused by the stone moving through the kidney, ureter, bladder, and urethra (urinary tract). When the stone moves, the ureter starts to spasm around the stone. The stone is usually passed in your pee (urine).  HOME CARE  Drink enough fluids to keep your pee clear or pale yellow. This helps to get the stone out.  Strain all pee through the provided strainer. Do not pee without peeing through the strainer, not even once. If you pee the stone out, catch it in the strainer. The stone may be as small as a grain of salt. Take this to your doctor. This will help your doctor figure out what you can do to try to prevent more kidney stones.  Only take medicine as told by your doctor.  Follow up with your doctor as told.  Get follow-up X-rays as told by your doctor. GET HELP IF: You have pain that gets worse even if you have been taking pain medicine. GET HELP RIGHT AWAY IF:   Your pain does not get better with medicine.  You have a fever or shaking chills.  Your pain increases and gets worse over 18 hours.  You have new belly (abdominal) pain.  You feel faint or pass out.  You are unable to pee. MAKE SURE YOU:   Understand these instructions.  Will watch your condition.  Will get help right away if you are not doing well or get worse. Document Released: 02/08/2008 Document Revised: 04/24/2013 Document Reviewed: 01/23/2013 [facility] Patient Information 2015 [place], [place]. This information is not intended to replace advice given to you by your health care provider. Make sure you discuss any questions you have with your health care provider.  Notify urologist at [facility] about the recurrent kidney stone. Return for any newer worse symptoms or if not improved in 2 days. Take pain medication as directed Zofran as needed for nausea and vomiting. And hydrate yourself well.

## 2014-03-06 NOTE — ED Provider Notes (Signed)
CSN: [id number]     Arrival date & time 03/06/14  1300 History  This chart was scribed for Fredia Sorrow, MD by Ludger Nutting, ED Scribe. This patient was seen in room APA17/APA17 and the patient's care was started 1:35 PM.    Chief Complaint  Patient presents with  . Flank Pain    The history is provided by the patient. No language interpreter was used.    HPI Comments: [name] is a 73 y.o. male with past medical history of kidney stones who presents to the Emergency Department complaining of sudden onset, constant, unchanged right flank pain with radiation to the right groin and testicle that began 3 hours ago. He also reports associated nausea and vomiting. Patient states it has been about 2.5 years since the last time he had flank pain relating to a kidney stone. He has taken 1 dose of Hydrocodone 5 mg with minimal relief. He denies fever, hematuria.   PCP Delphina Cahill Past Medical History  Diagnosis Date  . Hyperlipemia   . Allergic rhinitis   . Asthma   . CAD (coronary artery disease)     NUCLEAR STRESS TEST, 04/07/2011 - new ischemia may be present, concerning for multivessel distribution disease and ischemic ectopy  . Family hx of colon cancer   . Gout   . GERD (gastroesophageal reflux disease)   . Hemorrhoids   . Diverticulosis   . Hypertension   . Erectile dysfunction   . History of nephrolithiasis   . Asthmatic bronchitis   . Polycythemia   . CAS (cerebral atherosclerosis)     CAROTID DOPPLER, 08/25/2011 - mildly abnormal  . Syncope     2D ECHO, 07/13/2009 - EF >55%, normal   Past Surgical History  Procedure Laterality Date  . Coronary artery bypass graft  07/23/2004    LIMA-circumflex, RIMA-LAD, SVG-diagonal,   . Cardiac catheterization  04/26/2011    Patent RIMA-LAD, atretic LIMA-circumflex (previous) 100 and occluded SVG to diagonal and SVG to PDA. LAD 80-90% eccentric stenosis at SP1 that has 70% stenosis. Circumflex: Large OM and another distal branch with 90-95%  stenosis followed by lesion in the OM branch. RCA dominant 6% lesion proximally and several old lesions. PDA occluded 70-80% annular lesion in the proximal RCA. 3 of 4 grafts occlu  . Coronary angioplasty with stent placement Bilateral 04/28/2011    PCI-circumflex OM stented with 2 overlapping Resolute DES 3x55mm and 3x79mm stents post dilated to 3.65mm; PCI to RCA stented with a Promus DES 3x40mm stent dilated to 3.54mm  . Cardiac catheterization  07/15/2004    CABG recommended; continue medical therapy   Family History  Problem Relation Age of Onset  . Heart disease Father 12  . Cancer Father     Colon  . Heart disease Brother   . Cancer Brother     Colon  . Stroke Mother 68  . Hypertension Mother 40  . Hypertension Sister 69  . Hyperlipidemia Sister   . Hypertension Brother 14   History  Substance Use Topics  . Smoking status: Former Smoker -- 1.00 packs/day for 7 years    Types: Cigarettes    Quit date: 09/06/1963  . Smokeless tobacco: Never Used  . Alcohol Use: No    Review of Systems  Constitutional: Negative for fever and chills.  HENT: Negative for rhinorrhea and sore throat.   Eyes: Negative for visual disturbance.  Respiratory: Negative for cough and shortness of breath.   Cardiovascular: Negative for chest pain and  leg swelling.  Gastrointestinal: Positive for nausea, vomiting and abdominal pain.  Genitourinary: Positive for flank pain (right), difficulty urinating and testicular pain (right). Negative for dysuria and hematuria.  Musculoskeletal: Negative for back pain and neck pain.  Skin: Negative for rash.  Neurological: Negative for headaches.  Hematological: Does not bruise/bleed easily.  Psychiatric/Behavioral: Negative for confusion.      Allergies  Meperidine hcl and Penicillins  Home Medications   Prior to Admission medications   Medication Sig Start Date End Date Taking? Authorizing Provider  albuterol (PROVENTIL HFA;VENTOLIN HFA) 108 (90 BASE)  MCG/ACT inhaler Inhale 1 puff into the lungs every 6 (six) hours as needed for wheezing or shortness of breath.   Yes Historical Provider, MD  allopurinol (ZYLOPRIM) 300 MG tablet Take 300 mg by mouth daily.     Yes Historical Provider, MD  ALPRAZolam Duanne Moron) 0.5 MG tablet Take 0.5 mg by mouth daily as needed for anxiety. As needed 05/19/11  Yes Historical Provider, MD  aspirin EC 81 MG tablet Take 81 mg by mouth every other day. Alternating with Plavix.   Yes Historical Provider, MD  carvedilol (COREG) 3.125 MG tablet Take 1/4 tablet in morning and 1/2 tablet in the evening   Yes Historical Provider, MD  Cholecalciferol (VITAMIN D) 2000 UNITS CAPS Take 1 capsule by mouth daily.   Yes Historical Provider, MD  clopidogrel (PLAVIX) 75 MG tablet Take 1 tablet every other day alternating with asaprin. 07/05/13  Yes Leonie Man, MD  HYDROcodone-acetaminophen (VICODIN) 5-500 MG per tablet Take 1 tablet by mouth every 12 (twelve) hours as needed for pain.   Yes Historical Provider, MD  testosterone cypionate (DEPOTESTOTERONE CYPIONATE) 200 MG/ML injection Inject 200 mg into the muscle every 14 (fourteen) days. As directed takes 1/16ml every 2 weeks 05/09/11  Yes Historical Provider, MD  VYTORIN 10-40 MG per tablet 1/2 tablet by mouth once daily  07/01/11  Yes Historical Provider, MD  ondansetron (ZOFRAN ODT) 4 MG disintegrating tablet Take 1 tablet (4 mg total) by mouth every 8 (eight) hours as needed. 03/06/14   Fredia Sorrow, MD  oxyCODONE-acetaminophen (PERCOCET/ROXICET) 5-325 MG per tablet Take 1-2 tablets by mouth every 6 (six) hours as needed. 03/06/14   Fredia Sorrow, MD  SAFETY-LOK [facility] SYR 25GX5/8" 25G X 5/8" 3 ML MISC  04/25/12   Historical Provider, MD   BP 160/80  Pulse 68  Temp(Src) 97.3 F (36.3 C) (Oral)  Resp 20  Ht 5\' 8"  (1.727 m)  Wt 180 lb (81.647 kg)  BMI 27.38 kg/m2  SpO2 98% Physical Exam  Nursing note and vitals reviewed. Constitutional: He is oriented to person, place, and  time. He appears well-developed and well-nourished.  HENT:  Head: Normocephalic and atraumatic.  Cardiovascular: Normal rate, regular rhythm and normal heart sounds.   Pulmonary/Chest: Effort normal and breath sounds normal. No respiratory distress. He has no wheezes. He has no rales.  Abdominal: Soft. Bowel sounds are normal. He exhibits no distension. There is no tenderness.  Musculoskeletal: Normal range of motion. He exhibits no edema.  Neurological: He is alert and oriented to person, place, and time.  Skin: Skin is warm and dry.  Psychiatric: He has a normal mood and affect.    ED Course  Procedures (including critical care time)  DIAGNOSTIC STUDIES: Oxygen Saturation is 98% on RA, normal by my interpretation.    COORDINATION OF CARE: 1:44 PM Discussed treatment plan with pt at bedside and pt agreed to plan.   Labs Review Labs  Reviewed  CBC WITH DIFFERENTIAL - Abnormal; Notable for the following:    WBC 23.9 (*)    RBC 6.16 (*)    Hemoglobin 18.0 (*)    HCT 52.1 (*)    Neutrophils Relative % 92 (*)    Neutro Abs 21.9 (*)    Lymphocytes Relative 6 (*)    Monocytes Relative 2 (*)    All other components within normal limits  BASIC METABOLIC PANEL - Abnormal; Notable for the following:    Glucose, Bld 148 (*)    GFR calc non Af Amer 64 (*)    GFR calc Af Amer 74 (*)    Anion gap 17 (*)    All other components within normal limits  URINALYSIS, ROUTINE W REFLEX MICROSCOPIC - Abnormal; Notable for the following:    Hgb urine dipstick TRACE (*)    Ketones, ur >80 (*)    Protein, ur 100 (*)    All other components within normal limits  URINE MICROSCOPIC-ADD ON    Imaging Review Ct Abdomen Pelvis Wo Contrast  03/06/2014   CLINICAL DATA:  Right flank pain today  EXAM: CT ABDOMEN AND PELVIS WITHOUT CONTRAST  TECHNIQUE: Multidetector CT imaging of the abdomen and pelvis was performed following the standard protocol without IV contrast.  COMPARISON:  Renal ultrasound September 14, 2012  FINDINGS: There is right hydronephrosis and right perinephric stranding and fluid due to obstruction by 3 mm stone at the right ureterovesicular junction. There are nonobstructing stones in both kidneys. There is a 5.3 x 3.9 cm cyst in the midpole right kidney unchanged compared to prior ultrasound. There is no left hydronephrosis.  The liver, spleen, pancreas, gallbladder, adrenal glands are normal. There is atherosclerosis of the abdominal aorta without aneurysmal dilatation. There is no small bowel obstruction. There is diverticulosis of colon without diverticulitis. The appendix is not seen but no inflammation is noted around cecum to suggest appendicitis.  Fluid-filled bladder is identified with a 3 mm stone at the right ureterovesicular junction. Small prostate calcification is identified. There is mild atelectasis ff the dependent lung bases. Degenerative joint changes of the spine are noted.  IMPRESSION: Right hydroureteronephrosis due to obstruction by 3 mm stone at the right ureterovesicular junction.   Electronically Signed   By: Abelardo [name] M.D.   On: 03/06/2014 14:51     EKG Interpretation None      MDM   Final diagnoses:  Ureteral calculus, right    Patient was onset of right flank pain CT shows 3 mm stone at the ureterovesicular junction. Patient should be a pass this. Patient's had a history of multiple stones in the past. Much more comfortable after hydromorphone. Will discharge home with Percocet and Zofran and followup with his urologist at [facility].  I personally performed the services described in this documentation, which was scribed in my presence. The recorded information has been reviewed and is accurate.    Fredia Sorrow, MD 03/06/14 [id number]

## 2014-03-10 ENCOUNTER — Other Ambulatory Visit: Payer: Self-pay

## 2014-03-10 MED ORDER — CARVEDILOL 3.125 MG PO TABS: ORAL_TABLET | ORAL | Status: DC

## 2014-03-10 NOTE — Telephone Encounter (Signed)
Rx was sent to pharmacy electronically. 

## 2014-04-08 ENCOUNTER — Other Ambulatory Visit: Payer: Self-pay

## 2014-04-08 MED ORDER — EZETIMIBE-SIMVASTATIN 10-40 MG PO TABS: 0.5000 | ORAL_TABLET | Freq: Every day | ORAL | Status: DC

## 2014-04-08 NOTE — Telephone Encounter (Signed)
Rx was sent to pharmacy electronically. 

## 2014-04-15 ENCOUNTER — Telehealth: Payer: Self-pay | Admitting: *Deleted

## 2014-04-15 NOTE — Telephone Encounter (Signed)
Prior authorization completed - spoke representative Tommie Sams - MEDICARE B [id number] [place]  patient has used Atorvastatin/Lipitor in 2004/2005.  DX :CAD,DYSLIPIDEMIA   Patient medication is approved. Notified pharmacist- Hilliard Clark--  of the approval for 24 months

## 2014-07-28 ENCOUNTER — Telehealth: Payer: Self-pay | Admitting: Cardiology

## 2014-07-28 DIAGNOSIS — I2510 Atherosclerotic heart disease of native coronary artery without angina pectoris: Secondary | ICD-10-CM

## 2014-07-28 DIAGNOSIS — Z9861 Coronary angioplasty status: Principal | ICD-10-CM

## 2014-07-28 NOTE — Telephone Encounter (Signed)
[name] is calling because he has an appointment on 08/07/2014 at [place] and is asking for a lab order to be sent to him before the appt . Please call if you have any questions    Thanks

## 2014-07-28 NOTE — Telephone Encounter (Signed)
Spoke with pt wife, orders for lipid and CMET mailed to the confirmed home address

## 2014-08-05 LAB — COMPREHENSIVE METABOLIC PANEL
ALBUMIN: 4.3 g/dL (ref 3.5–5.2)
ALT: 30 U/L (ref 0–53)
AST: 26 U/L (ref 0–37)
Alkaline Phosphatase: 65 U/L (ref 39–117)
BUN: 14 mg/dL (ref 6–23)
CHLORIDE: 103 meq/L (ref 96–112)
CO2: 27 meq/L (ref 19–32)
Calcium: 9.1 mg/dL (ref 8.4–10.5)
Creat: 0.93 mg/dL (ref 0.50–1.35)
GLUCOSE: 108 mg/dL — AB (ref 70–99)
POTASSIUM: 4.9 meq/L (ref 3.5–5.3)
Sodium: 141 mEq/L (ref 135–145)
Total Bilirubin: 0.8 mg/dL (ref 0.2–1.2)
Total Protein: 6.3 g/dL (ref 6.0–8.3)

## 2014-08-05 LAB — LIPID PANEL
CHOL/HDL RATIO: 3.3 ratio
Cholesterol: 156 mg/dL (ref 0–200)
HDL: 48 mg/dL (ref >39)
LDL Cholesterol: 89 mg/dL (ref 0–99)
Triglycerides: 94 mg/dL (ref <150)
VLDL: 19 mg/dL (ref 0–40)

## 2014-08-07 ENCOUNTER — Encounter: Payer: Self-pay | Admitting: Cardiology

## 2014-08-07 ENCOUNTER — Encounter: Payer: Self-pay | Admitting: *Deleted

## 2014-08-07 ENCOUNTER — Ambulatory Visit (INDEPENDENT_AMBULATORY_CARE_PROVIDER_SITE_OTHER): Payer: 59 | Admitting: Cardiology

## 2014-08-07 VITALS — BP 126/82 | HR 80 | Ht 68.0 in | Wt 190.5 lb

## 2014-08-07 DIAGNOSIS — I493 Ventricular premature depolarization: Secondary | ICD-10-CM

## 2014-08-07 DIAGNOSIS — Z9861 Coronary angioplasty status: Secondary | ICD-10-CM

## 2014-08-07 DIAGNOSIS — E785 Hyperlipidemia, unspecified: Secondary | ICD-10-CM

## 2014-08-07 DIAGNOSIS — I2510 Atherosclerotic heart disease of native coronary artery without angina pectoris: Secondary | ICD-10-CM

## 2014-08-07 DIAGNOSIS — I25810 Atherosclerosis of coronary artery bypass graft(s) without angina pectoris: Secondary | ICD-10-CM

## 2014-08-07 DIAGNOSIS — G4733 Obstructive sleep apnea (adult) (pediatric): Secondary | ICD-10-CM

## 2014-08-07 NOTE — Progress Notes (Signed)
PCP: Delphina Cahill, MD  Clinic Note: Chief Complaint  Patient presents with  . Follow-up    12 mth visit.  pt denies chest pain, sob, and swelling   HPI: [name] is a 73 y.o. male with a PMH below who presents today for annual followup of CAD with redo CABG and PCI to his native vessels.  Past Medical History  Diagnosis Date  . Allergic rhinitis   . Asthma   . Family hx of colon cancer   . Gout   . GERD (gastroesophageal reflux disease)   . Hemorrhoids   . Diverticulosis   . Erectile dysfunction   . History of nephrolithiasis   . Asthmatic bronchitis   . Polycythemia   . CAS (cerebral atherosclerosis)     CAROTID DOPPLER, 08/25/2011 - mildly abnormal  . Syncope     2D ECHO, 07/13/2009 - EF >55%, normal  . CAD (coronary artery disease), native coronary artery 07/2004    FATIGUE - PVCs -- CATH -> MV CAD --> CABG X 4  . 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 CAD of autologous vein bypass graft without angina 04/2011    Frequent PVCs & fatigue -> MYOVIEW w/ inferior ischemic with concern for MV distribution --> CATH: only RIMA-LAD patent (LIMA atretic & SVGs occluded) --> Staged PCI  . CAD S/P percutaneous coronary angioplasty 04/2011    PCI- Cx-OM W/ 2 overlapping Resolute DES 3x71mm & 3x69mm stents (3.18mm),  PCI to RCA -  Promus DES 3x59mm (3.99mm);  Marland Kitchen Symptomatic PVCs   . Hyperlipidemia with target LDL less than 70   . Essential hypertension    Interval History: Lenix has had pretty good your overall from a cardiac standpoint. His PVCs are very minimal at best. We have been through many different configurations of his beta blocker dosing and finally have it down to a very complicated regimen using 3.5 mg carvedilol tablets (1/4 tab in AM, 1/2 tab in PM).  He does not note any significant exertional fatigue, dyspnea or chest tightness/pressure. He still has his asthma-related cough plan dyspnea with significant exertion but really is much better  overall. He denies any heart failure symptoms of PND, orthopnea or edema.  No episodes of lightheadedness, dizziness, weakness, syncope/near syncope, or TIA/amaurosis fugax symptoms.  ROS: A comprehensive was performed. Review of Systems  HENT: Positive for congestion (Chronic). Negative for nosebleeds.   Respiratory: Positive for cough (Chronic cough) and sputum production (only in the morning). Negative for hemoptysis, shortness of breath and wheezing.   Cardiovascular: Negative for claudication.       Per history of present illness  Gastrointestinal: Negative for blood in stool and melena.  Genitourinary: Negative for hematuria.       He isn't having much less difficulty voiding ever since having a urologic procedure done to remove a significant scar tissue in the urethra  Musculoskeletal: Positive for back pain. Negative for myalgias and falls.       He has severe lower back pain with radicular pain down both legs.  Neurological: Negative for dizziness, sensory change, speech change, focal weakness, seizures and loss of consciousness.  Endo/Heme/Allergies: Bruises/bleeds easily.  Psychiatric/Behavioral: Negative for depression and memory loss. The patient is not nervous/anxious and does not have insomnia.   All other systems reviewed and are negative.  Current Outpatient Prescriptions on File Prior to Visit  Medication Sig Dispense Refill  . albuterol (PROVENTIL HFA;VENTOLIN HFA) 108 (90 BASE)  MCG/ACT inhaler Inhale 1 puff into the lungs every 6 (six) hours as needed for wheezing or shortness of breath.    . allopurinol (ZYLOPRIM) 300 MG tablet Take 300 mg by mouth daily.      Marland Kitchen ALPRAZolam (XANAX) 0.5 MG tablet Take 0.5 mg by mouth daily as needed for anxiety. As needed    . aspirin EC 81 MG tablet Take 81 mg by mouth every other day. Alternating with Plavix.    . carvedilol (COREG) 3.125 MG tablet Take 1/4 tablet in morning and 1/2 tablet in the evening 90 tablet 1  . Cholecalciferol  (VITAMIN D) 2000 UNITS CAPS Take 1 capsule by mouth daily.    . clopidogrel (PLAVIX) 75 MG tablet Take 1 tablet every other day alternating with asaprin.    Marland Kitchen ezetimibe-simvastatin (VYTORIN) 10-40 MG per tablet Take 0.5 tablets by mouth daily. 45 tablet 1   No current facility-administered medications on file prior to visit.   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 Soft 1/6 SEM crescendo -decrescendo at the RUSB radiating to carotids. Abdomen: soft, non-tender; bowel sounds normal; no masses, no organomegaly Extremities: extremities normal, atraumatic, no cyanosis or edema. Pulses: 2+ and symmetric; Skin: normal  Neurologic: Mental status: Alert, oriented, thought content appropriate   Adult ECG Report  Rate: 80 ;  Rhythm: normal sinus rhythm, ? LAA,  Left Axis Deviation(-31)  Narrative Interpretation: stable EKG  Recent Labs:   Lab Results  Component Value Date   CHOL 156 08/05/2014   HDL 48 08/05/2014   LDLCALC 89 08/05/2014   TRIG 94 08/05/2014   CHOLHDL 3.3 08/05/2014     Chemistry      Component Value Date/Time   NA 141 08/05/2014 0844   K 4.9 08/05/2014 0844   CL 103 08/05/2014 0844   CO2 27 08/05/2014 0844   BUN 14 08/05/2014 0844   CREATININE 0.93 08/05/2014 0844   CREATININE 1.10 03/06/2014 1420        Component Value Date/Time   CALCIUM 9.1 08/05/2014 0844   ALKPHOS 65 08/05/2014 0844   AST 26 08/05/2014 0844   ALT 30 08/05/2014 0844   BILITOT 0.8 08/05/2014 0844     ASSESSMENT / PLAN: CAD S/P percutaneous coronary angioplasty - PCI of Cx-OM w/ 2 Resolute DES 3.14mm x 15 mm & 3.0 mm x 2mm  (3.86mm); PCI-RCA (Promus Element DES 3.0 mm x 38 mm (3.2mm) He now is dealing with basically 4 out of the 5 major conduit vessels that were previously grafted with PCI to the main circumflex and OM as well the RCA. The diagonal actually get some flow from the native LAD. This is deathly improved him from a symptom standpoint. He is less fatigued and the PVCs that are bothersome every morninghave become minimal.  He has  been very difficult to treat overall. As noted above he is on a minimal dose of carvedilol which has kept him stable. He is on a statin and takes an alternating regimen of Plavix 2 days a week and aspirin 81 mg on the days the week.  Very complicated regimen, but for him this past year has been one of the best years that he has had in several years.therefore I will not making changes.   CAD (coronary artery disease) of artery bypass graft -atretic LIMA- circumflex, occluded SVG-RCA and SVG-diagonal. 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 Would probably consider a stress test after his next visit in a year.  Hyperlipidemia with target LDL less than 70 Has been stable on Vytorin for years. No myalgias or arthralgias. His last lipid panel was pretty good with continued improvement progression. We did discuss some potential dietary modifications and the need to increase his exercise which should help as well. He probably will need labs checked again until next year.  PVC's (premature ventricular contractions) Much better over last  year on his complex beta blocker routine  Obstructive sleep apnea Tolerating CPAP. No complaints    Orders Placed This Encounter  Procedures  . EKG 12-Lead   Meds ordered this encounter  Medications  . chlorzoxazone (PARAFON) 500 MG tablet    Sig: Take 500 mg by mouth 2 (two) times daily.     Refill:  1  . methylPREDNIsolone (MEDROL DOSPACK) 4 MG tablet    Sig: Take 4 mg by mouth as needed.     Refill:  0    Followup: 1 year   Muslima Toppins W, M.D., M.S. Interventional Cardiologist   Pager # [phone]

## 2014-08-07 NOTE — Assessment & Plan Note (Signed)
Tolerating CPAP. No complaints

## 2014-08-07 NOTE — Assessment & Plan Note (Signed)
He now is dealing with basically 4 out of the 5 major conduit vessels that were previously grafted with PCI to the main circumflex and OM as well the RCA. The diagonal actually get some flow from the native LAD. This is deathly improved him from a symptom standpoint. He is less fatigued and the PVCs that are bothersome every morninghave become minimal.  He has been very difficult to treat overall. As noted above he is on a minimal dose of carvedilol which has kept him stable. He is on a statin and takes an alternating regimen of Plavix 2 days a week and aspirin 81 mg on the days the week.  Very complicated regimen, but for him this past year has been one of the best years that he has had in several years.therefore I will not making changes.

## 2014-08-07 NOTE — Patient Instructions (Signed)
No change with medications  Your physician wants you to follow-up in 12 months Dr Ellyn Hack 30 min appointment.  You will receive a reminder letter in the mail two months in advance. If you don't receive a letter, please call our office to schedule the follow-up appointment.

## 2014-08-07 NOTE — Assessment & Plan Note (Signed)
Much better over last year on his complex beta blocker routine

## 2014-08-07 NOTE — Assessment & Plan Note (Signed)
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 Would probably consider a stress test after his next visit in a year.

## 2014-08-07 NOTE — Assessment & Plan Note (Signed)
Has been stable on Vytorin for years. No myalgias or arthralgias. His last lipid panel was pretty good with continued improvement progression. We did discuss some potential dietary modifications and the need to increase his exercise which should help as well. He probably will need labs checked again until next year.

## 2014-08-14 ENCOUNTER — Other Ambulatory Visit: Payer: Self-pay | Admitting: Cardiology

## 2014-08-14 NOTE — Telephone Encounter (Signed)
Rx refill sent to patient pharmacy   

## 2014-10-10 ENCOUNTER — Other Ambulatory Visit: Payer: Self-pay | Admitting: Cardiology

## 2014-10-13 NOTE — Telephone Encounter (Signed)
Rx(s) sent to pharmacy electronically.  

## 2015-02-11 ENCOUNTER — Telehealth: Payer: Self-pay | Admitting: Cardiology

## 2015-02-11 NOTE — Telephone Encounter (Signed)
Pt c/o Shortness Of Breath: STAT if SOB developed within the last 24 hours or pt is noticeably SOB on the phone  1. Are you currently SOB (can you hear that pt is SOB on the phone)? Yes  2. How long have you been experiencing SOB? Several months  3. Are you SOB when sitting or when up moving around? Moving around  4. Are you currently experiencing any other symptoms? No  Pt would also like to know if you received labs from his PCP if not he can fax them to [place]

## 2015-02-11 NOTE — Telephone Encounter (Signed)
Shortness of breath for months. No acute symptoms.  Pt explains he has dyspnea and problems w/ legs. Wants to be seen by Dr. Ellyn Hack asap. Declines flatly to be seen by extender.   Pt declined my offer to book an appt when I explained next available visits are in August - he insists on [name] calling him to get opening.  Will route.

## 2015-02-11 NOTE — Telephone Encounter (Signed)
Yeah, this is a hard 1.  He is a little bit on the demanding side. We can see if we may be worked him into my half days this month.  [place]

## 2015-02-12 ENCOUNTER — Encounter: Payer: Self-pay | Admitting: Internal Medicine

## 2015-02-12 NOTE — Telephone Encounter (Signed)
SPOKE TO WIFE. SHE STATES PATIENT HAS BEEN HAVING  INCREASE SWELLING IN HIS STOMACH AND ANKLES. COMPLAINS OF SHORTNESS OF BREATH. SHE STATES IT HAS BEEN GOING ON FOR A WHILE. SHE STATES PATIENT DID HAVE A VIRUS RECENTLY AND HAS NOT FELT GOOD SINCE , DURING THIS TIME DID DEVELOP SOME LEFT SHOULDER PAIN AND ARM ,WITH COLD SWEATS FOR SEVERAL DAYS -PATIENT DID NOT GO TO HOSPITAL DURING THIS TIME.LABS WERE DONE BY PRIMARY-PATIENT WILL BRING LAB WORK. APPOINTMENT MADE FOR 02/17/15 AT 10:30 AM -WIFE AWARE. IF SYMPTOMS BECOME WORSE ,GO TO ER. WIFE AWARE.

## 2015-02-17 ENCOUNTER — Encounter: Payer: Self-pay | Admitting: Cardiology

## 2015-02-17 ENCOUNTER — Ambulatory Visit (INDEPENDENT_AMBULATORY_CARE_PROVIDER_SITE_OTHER): Payer: 59 | Admitting: Cardiology

## 2015-02-17 VITALS — BP 136/86 | HR 67 | Ht 68.0 in | Wt 193.8 lb

## 2015-02-17 DIAGNOSIS — I2510 Atherosclerotic heart disease of native coronary artery without angina pectoris: Secondary | ICD-10-CM

## 2015-02-17 DIAGNOSIS — I25700 Atherosclerosis of coronary artery bypass graft(s), unspecified, with unstable angina pectoris: Secondary | ICD-10-CM

## 2015-02-17 DIAGNOSIS — I208 Other forms of angina pectoris: Secondary | ICD-10-CM

## 2015-02-17 DIAGNOSIS — D689 Coagulation defect, unspecified: Secondary | ICD-10-CM

## 2015-02-17 DIAGNOSIS — I25708 Atherosclerosis of coronary artery bypass graft(s), unspecified, with other forms of angina pectoris: Secondary | ICD-10-CM | POA: Diagnosis not present

## 2015-02-17 DIAGNOSIS — R600 Localized edema: Secondary | ICD-10-CM | POA: Diagnosis not present

## 2015-02-17 DIAGNOSIS — E785 Hyperlipidemia, unspecified: Secondary | ICD-10-CM

## 2015-02-17 DIAGNOSIS — R0609 Other forms of dyspnea: Secondary | ICD-10-CM

## 2015-02-17 DIAGNOSIS — Z9861 Coronary angioplasty status: Secondary | ICD-10-CM

## 2015-02-17 DIAGNOSIS — I493 Ventricular premature depolarization: Secondary | ICD-10-CM

## 2015-02-17 DIAGNOSIS — Z01818 Encounter for other preprocedural examination: Secondary | ICD-10-CM

## 2015-02-17 DIAGNOSIS — I209 Angina pectoris, unspecified: Secondary | ICD-10-CM | POA: Insufficient documentation

## 2015-02-17 LAB — CBC
HEMATOCRIT: 44.8 % (ref 39.0–52.0)
HEMOGLOBIN: 15.1 g/dL (ref 13.0–17.0)
MCH: 28.5 pg (ref 26.0–34.0)
MCHC: 33.7 g/dL (ref 30.0–36.0)
MCV: 84.7 fL (ref 78.0–100.0)
MPV: 10 fL (ref 8.6–12.4)
Platelets: 260 10*3/uL (ref 150–400)
RBC: 5.29 MIL/uL (ref 4.22–5.81)
RDW: 14 % (ref 11.5–15.5)
WBC: 9.3 10*3/uL (ref 4.0–10.5)

## 2015-02-17 NOTE — Assessment & Plan Note (Signed)
Only able to tolerate low-dose beta blocker. The PVCs have been relatively rare, but when they start coming back it is more consistent when he is having problems with CAD.

## 2015-02-17 NOTE — Assessment & Plan Note (Signed)
On Vytorin. Labs look relatively well-controlled. Depending on his cath looks like we may want to be more aggressive in the future. He Urso the medications and for now I would just keep him stable.

## 2015-02-17 NOTE — Assessment & Plan Note (Signed)
Atretic LIMA-Cx, Patent RIMA-LAD with occluded SVGs -- OK to use R Radial approach.

## 2015-02-17 NOTE — Progress Notes (Signed)
PCP: Delphina Cahill, MD  Clinic Note: Chief Complaint  Patient presents with  . Follow-up     has a lot of fluid renention; no chest pain, has shortness of breath, has edema in legs, feet and ankles, has pain in legs, occassional cramping in legs, occassional lightheadedness, occassional dizziness  . Shortness of Breath    Associated with edema  . Coronary Artery Disease    HPI: [name] is a 74 y.o. male with a PMH below who presents today for an urgent work-in visit for concerning symptoms noted above.  Past Medical History  Diagnosis Date  . Allergic rhinitis   . Asthma   . Family hx of colon cancer   . Gout   . GERD (gastroesophageal reflux disease)   . Hemorrhoids   . Diverticulosis   . Erectile dysfunction   . History of nephrolithiasis   . Asthmatic bronchitis   . Polycythemia   . CAS (cerebral atherosclerosis)     CAROTID DOPPLER, 08/25/2011 - mildly abnormal  . Syncope     2D ECHO, 07/13/2009 - EF >55%, normal  . CAD (coronary artery disease), native coronary artery 07/2004    FATIGUE - PVCs -- CATH -> MV CAD --> CABG X 4  . 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 CAD of autologous vein bypass graft without angina 04/2011    Frequent PVCs & fatigue -> MYOVIEW w/ inferior ischemic with concern for MV distribution --> CATH: only RIMA-LAD patent (LIMA atretic & SVGs occluded) --> Staged PCI  . CAD S/P percutaneous coronary angioplasty 04/2011    PCI- Cx-OM W/ 2 overlapping Resolute DES 3x55mm & 3x75mm stents (3.28mm),  PCI to RCA -  Promus DES 3x59mm (3.46mm);  Marland Kitchen Symptomatic PVCs   . Hyperlipidemia with target LDL less than 70   . Essential hypertension     Prior Cardiac Evaluation and Past Surgical History: Past Surgical History  Procedure Laterality Date  . Cardiac catheterization  07/15/2004    CABG recommended; continue medical therapy  . Coronary artery bypass graft  07/23/2004    LIMA-Cx RIMA-LAD, SVG-diagonal, SVG-PDA  . Cardiac  catheterization  04/26/2011    Patent RIMA-LAD, atretic LIMA-circumflex (previous) 100 and occluded SVG to diagonal and SVG to PDA. LAD 80-90% eccentric stenosis at SP1 that has 70% stenosis. Circumflex: Large OM and another distal branch with 90-95% stenosis followed by lesion in the OM branch. RCA dominant 6% lesion proximally and several old lesions. PDA occluded 70-80% annular lesion in the proximal RCA. 3 of 4 grafts occlu  . Coronary angioplasty with stent placement  04/28/2011    PCI-circumflex OM stented with 2 overlapping Resolute DES 3x55mm and 3x85mm stents post dilated to 3.73mm; PCI to RCA stented with a Promus DES 3x1mm stent dilated to 3.6mm    Interval History:  [name] presents today as an early working visits with complaints of progressively worsening edema and exertional dyspnea. He is not able to walk more than 20-30 feet without getting fairly dyspneic. He denies any associated pressure in his chest. He is quick to point out that he never had that symptom prior to his CABG. He is also noted significantly reduced exercise tolerance and fatigue. He can't make out to the mailbox without having to catch his breath. He denies any PND or orthopnea and only has rare palpitations. He sometimes gets nauseated and dizzy when he goes from lying down sitting up.  Cardiovascular ROS: positive for -  dyspnea on exertion, edema, palpitations, shortness of breath and Decreased exercise tolerance negative for - loss of consciousness, murmur, orthopnea, paroxysmal nocturnal dyspnea, rapid heart rate or TIA/amaurosis fugax. Syncope/near syncope  No claudication.  ROS: A comprehensive was performed. Review of Systems  Constitutional: Positive for fever (Associated with GI bug/flulike illness) and malaise/fatigue (significantly reduced energy with decreased exercise tolerance. Only able to walk about 20-30 feet.).  HENT: Negative for nosebleeds.   Respiratory: Positive for shortness of breath  (associated with wheezing and just difficulty catching his breath. Often exertional but can happen at rest) and wheezing (Intermittently has episodes of wheezing).   Cardiovascular: Positive for palpitations and leg swelling.       Per history of present illness  Gastrointestinal: Positive for nausea and vomiting. Negative for blood in stool and melena.       He had an episode about 2 weeks ago lasting about 1 week with fevers chills diarrhea along with nausea and vomiting. He thinks it was a flulike illness. His labs were checked shortly after that and he had slightly elevated LFTs. He returned to normal 10 days later. He has noted feeling more tired and fatigued since then has never bounced back.  Genitourinary: Negative for dysuria and hematuria (He did have an episode of nephrolithiasis passing a small kidney stone about a week and half ago.).  Musculoskeletal: Positive for back pain.  Neurological: Positive for dizziness (Basically just feels foggy and tired but usually this occurs when he goes from lying down or sitting to standing.).  Psychiatric/Behavioral: Negative for depression. The patient is nervous/anxious.   All other systems reviewed and are negative.   Current Outpatient Prescriptions on File Prior to Visit  Medication Sig Dispense Refill  . albuterol (PROVENTIL HFA;VENTOLIN HFA) 108 (90 BASE) MCG/ACT inhaler Inhale 1 puff into the lungs every 6 (six) hours as needed for wheezing or shortness of breath.    . allopurinol (ZYLOPRIM) 300 MG tablet Take 300 mg by mouth daily.      Marland Kitchen ALPRAZolam (XANAX) 0.5 MG tablet Take 0.5 mg by mouth daily as needed for anxiety. As needed    . aspirin EC 81 MG tablet Take 81 mg by mouth every other day. Alternating with Plavix.    . carvedilol (COREG) 3.125 MG tablet TAKE 1/4 TABLET IN MORNING AND 1/2 TABLET IN THE EVENING 90 tablet 3  . chlorzoxazone (PARAFON) 500 MG tablet Take 500 mg by mouth 2 (two) times daily.   1  . Cholecalciferol (VITAMIN  D) 2000 UNITS CAPS Take 1 capsule by mouth daily.    . clopidogrel (PLAVIX) 75 MG tablet Take 1 tablet every other day alternating with asaprin.    Marland Kitchen ezetimibe-simvastatin (VYTORIN) 10-40 MG per tablet TAKE 0.5 TABLETS BY MOUTH DAILY. 45 tablet 3  . methylPREDNIsolone (MEDROL DOSPACK) 4 MG tablet Take 4 mg by mouth as needed.   0   No current facility-administered medications on file prior to visit.   Allergies  Allergen Reactions  . Meperidine Hcl   . Penicillins     History  Substance Use Topics  . Smoking status: Former Smoker -- 1.00 packs/day for 7 years    Types: Cigarettes    Quit date: 09/06/1963  . Smokeless tobacco: Never Used  . Alcohol Use: No   Family History  Problem Relation Age of Onset  . Heart disease Father 40  . Cancer Father     Colon  . Heart disease Brother   . Cancer Brother  Colon  . Stroke Mother 48  . Hypertension Mother 44  . Hypertension Sister 9  . Hyperlipidemia Sister   . Hypertension Brother 85     Wt Readings from Last 3 Encounters:  02/17/15 87.907 kg (193 lb 12.8 oz)  08/07/14 86.41 kg (190 lb 8 oz)  03/06/14 81.647 kg (180 lb)    PHYSICAL EXAM BP 136/86 mmHg  Pulse 67  Ht 5\' 8"  (1.727 m)  Wt 87.907 kg (193 lb 12.8 oz)  BMI 29.47 kg/m2 General appearance: alert, cooperative, appears stated age, no distress and borderline obese; actually present and affect HEENT: [place]/AT, EOMI, MMM, anicteric sclera Neck: no adenopathy, no carotid bruit and no JVD Lungs: clear to auscultation bilaterally, normal percussion bilaterally and non-labored Heart: regular rate and rhythm, S1 & S2 normal, + S4, soft 1/6 c-d SEM @ RUSB-> carotids; No click, rub; nondisplaced PMI Abdomen: soft, non-tender; bowel sounds normal; no masses,  no organomegaly; mildly protuberant abdomen Extremities: extremities normal, atraumatic, no cyanosis, and edema 1-2+  Right ankle, 1+ left ankle Pulses: 2+ and symmetric;  Skin: mobility and turgor normal    Neurologic: Mental status: Alert, oriented, thought content appropriate Cranial nerves: normal (II-XII grossly intact)   Adult ECG Report  Rate: 67 ;  Rhythm: normal sinus rhythm  Narrative Interpretation: essentially normal EKG with leftward axis of-28, mild J-point elevation in precordial leads T waves.  Recent Labs: 01/12/2015  Na+ 137, K+ 4.8, Cl- 101, HCO3-  29 , BUN 12, Cr 0.93, Glu 99, Ca2+ 9.2; AST 48 (reduced to 23 5/19), ALT 88 (down to 35 on 5/19) AlkP 63, Alb 4.1, TP 6.2, T Bili 0.6  CBC: W 7.1, H/H 14.4/43.6, Plt 255  TC 144, TG 110, HDL 31, LDL 91   ASSESSMENT / PLAN: Problem List Items Addressed This Visit    Angina, class III    He really did not have any typical symptoms of angina in the past. When he did note was profound exertional dyspnea fatigue and exercise intolerance with applications. These are all similar symptoms when he is having now and they're getting progressively worse. I'm concerned that this conglomeration of symptoms are concerning for crescendo class III angina and therefore would prefer to proceed with cardiac catheterization as he has extensive stents in his circumflex and RCA system. We can probably go from right radial as he has a RIMA graft patent with an atretic LIMA graft  Plan: Cardiac cath with native and graft angiography from right radial approach.        Relevant Orders   EKG 12-Lead   ECHOCARDIOGRAM COMPLETE   LEFT HEART CATHETERIZATION WITH CORONARY/GRAFT ANGIOGRAM   APTT   Basic metabolic panel   CBC   Protime-INR   Bilateral lower extremity edema    He's not had this problem before. He doesn't have other heart failure symptoms of PND or orthopnea. He is noticing exertional dyspnea. I will start him on low-dose Lasix at 20 mg daily.  Will check a 2-D echocardiogram and also measured pressures in his left heart catheterization. If the EF is down I may consider adding right heart cath to the planned procedure.      CAD  (coronary artery disease) of artery bypass graft -atretic LIMA- circumflex, occluded SVG-RCA and SVG-diagonal. - Primary (Chronic)    Atretic LIMA-Cx, Patent RIMA-LAD with occluded SVGs -- OK to use R Radial approach.      Relevant Orders   EKG 12-Lead   ECHOCARDIOGRAM COMPLETE   LEFT  HEART CATHETERIZATION WITH CORONARY/GRAFT ANGIOGRAM   APTT   Basic metabolic panel   CBC   Protime-INR   CAD S/P percutaneous coronary angioplasty - PCI of Cx-OM w/ 2 Resolute DES 3.21mm x 15 mm & 3.0 mm x 11mm  (3.58mm); PCI-RCA (Promus Element DES 3.0 mm x 38 mm (3.47mm) (Chronic)    He remains on aspirin plus Plavix. He is on Vytorin with relatively well-controlled lipids noted above. Short of increasing potency of statin, I do not know what more we can do with his lipid control.  He is only on a low-dose beta blocker due to intolerance. He is not on ACE inhibitor or ARB because of issues with hypotension in the past.  Plan LHC       Relevant Orders   EKG 12-Lead   ECHOCARDIOGRAM COMPLETE   LEFT HEART CATHETERIZATION WITH CORONARY/GRAFT ANGIOGRAM   APTT   Basic metabolic panel   CBC   Protime-INR   DOE (dyspnea on exertion)    Again I think this may very well be an anginal problem however he is not have an echo checked in a while. I like to see what his EF  Is as well as filling pressures. He does have a soft murmur but as I recall that is mostly from aortosclerosis pressures not worse. Would also like to see what his diastolic pressures are. No wheezing for make it unnecessary to check an LVgram and conserve contrast during catheterization.  Echo; LHC      Relevant Orders   EKG 12-Lead   ECHOCARDIOGRAM COMPLETE   LEFT HEART CATHETERIZATION WITH CORONARY/GRAFT ANGIOGRAM   APTT   Basic metabolic panel   CBC   Protime-INR   Hyperlipidemia with target LDL less than 70 (Chronic)    On Vytorin. Labs look relatively well-controlled. Depending on his cath looks like we may want to be more aggressive  in the future. He Urso the medications and for now I would just keep him stable.      PVC's (premature ventricular contractions) (Chronic)    Only able to tolerate low-dose beta blocker. The PVCs have been relatively rare, but when they start coming back it is more consistent when he is having problems with CAD.       Other Visit Diagnoses    Pre-op testing        Relevant Orders    APTT    Basic metabolic panel    CBC    Protime-INR    Blood clotting disorder        Relevant Orders    APTT    Protime-INR       Orders Placed This Encounter  Procedures  . APTT  . Basic metabolic panel  . CBC  . Protime-INR  . EKG 12-Lead  . ECHOCARDIOGRAM COMPLETE    Standing Status: Future     Number of Occurrences:      Standing Expiration Date: 05/18/2016    Order Specific Question:  Where should this test be performed    Answer:  [facility]    Order Specific Question:  Complete or Limited study?    Answer:  Complete    Order Specific Question:  With Image Enhancing Agent or without Image Enhancing Agent?    Answer:  With Image Enhancing Agent    Order Specific Question:  Reason for exam-Echo    Answer:  Dyspnea  786.09 / R06.00    Order Specific Question:  Reason for exam-Echo    Answer:  Chest Pain  786.50 / R07.9  . LEFT HEART CATHETERIZATION WITH CORONARY/GRAFT ANGIOGRAM    Followup: post cath    Leonie Man, M.D., M.S. Interventional Cardiologist   Pager # [phone]

## 2015-02-17 NOTE — Assessment & Plan Note (Signed)
Again I think this may very well be an anginal problem however he is not have an echo checked in a while. I like to see what his EF  Is as well as filling pressures. He does have a soft murmur but as I recall that is mostly from aortosclerosis pressures not worse. Would also like to see what his diastolic pressures are. No wheezing for make it unnecessary to check an LVgram and conserve contrast during catheterization.  Echo; LHC

## 2015-02-17 NOTE — Patient Instructions (Addendum)
LEFT CORS AND POSSIBLE WITH GRAFTS- SCHEDULE FOR THIS Friday-- LEFT RADIAL AND RIGHT GROIN Your physician has requested that you have a cardiac catheterization. Cardiac catheterization is used to diagnose and/or treat various heart conditions. Doctors may recommend this procedure for a number of different reasons. The most common reason is to evaluate chest pain. Chest pain can be a symptom of coronary artery disease (CAD), and cardiac catheterization can show whether plaque is narrowing or blocking your heart's arteries. This procedure is also used to evaluate the valves, as well as measure the blood flow and oxygen levels in different parts of your heart. For further information please visit [email or web address]. Please follow instruction sheet, as given.  PLEASE DO LABS TODAY,NO XRAY NEEDED  PLEASE SCHEDULE BEFORE CATH. Your physician has requested that you have an echocardiogram. Echocardiography is a painless test that uses sound waves to create images of your heart. It provides your doctor with information about the size and shape of your heart and how well your heart's chambers and valves are working. This procedure takes approximately one hour. There are no restrictions for this procedure.  Your physician recommends that you schedule a follow-up appointment in 2 -[place] CATH --30 MIN APPOINTMENT.

## 2015-02-17 NOTE — Assessment & Plan Note (Signed)
He's not had this problem before. He doesn't have other heart failure symptoms of PND or orthopnea. He is noticing exertional dyspnea. I will start him on low-dose Lasix at 20 mg daily.  Will check a 2-D echocardiogram and also measured pressures in his left heart catheterization. If the EF is down I may consider adding right heart cath to the planned procedure.

## 2015-02-17 NOTE — Assessment & Plan Note (Signed)
He remains on aspirin plus Plavix. He is on Vytorin with relatively well-controlled lipids noted above. Short of increasing potency of statin, I do not know what more we can do with his lipid control.  He is only on a low-dose beta blocker due to intolerance. He is not on ACE inhibitor or ARB because of issues with hypotension in the past.  Plan LHC

## 2015-02-17 NOTE — Assessment & Plan Note (Addendum)
He really did not have any typical symptoms of angina in the past. When he did note was profound exertional dyspnea fatigue and exercise intolerance with applications. These are all similar symptoms when he is having now and they're getting progressively worse. I'm concerned that this conglomeration of symptoms are concerning for crescendo class III angina and therefore would prefer to proceed with cardiac catheterization as he has extensive stents in his circumflex and RCA system. We can probably go from right radial as he has a RIMA graft patent with an atretic LIMA graft  Plan: Cardiac cath with native and graft angiography from right radial approach.

## 2015-02-18 ENCOUNTER — Telehealth: Payer: Self-pay | Admitting: *Deleted

## 2015-02-18 ENCOUNTER — Ambulatory Visit (HOSPITAL_COMMUNITY)
Admission: RE | Admit: 2015-02-18 | Discharge: 2015-02-18 | Disposition: A | Discharge status: Home / Self Care | Payer: 59 | Source: Ambulatory Visit | Attending: Cardiology | Admitting: Cardiology

## 2015-02-18 DIAGNOSIS — I371 Nonrheumatic pulmonary valve insufficiency: Secondary | ICD-10-CM | POA: Diagnosis not present

## 2015-02-18 DIAGNOSIS — Z9861 Coronary angioplasty status: Secondary | ICD-10-CM

## 2015-02-18 DIAGNOSIS — I358 Other nonrheumatic aortic valve disorders: Secondary | ICD-10-CM | POA: Diagnosis not present

## 2015-02-18 DIAGNOSIS — I208 Other forms of angina pectoris: Secondary | ICD-10-CM | POA: Diagnosis not present

## 2015-02-18 DIAGNOSIS — R0609 Other forms of dyspnea: Secondary | ICD-10-CM | POA: Diagnosis not present

## 2015-02-18 DIAGNOSIS — I25700 Atherosclerosis of coronary artery bypass graft(s), unspecified, with unstable angina pectoris: Secondary | ICD-10-CM

## 2015-02-18 DIAGNOSIS — I2510 Atherosclerotic heart disease of native coronary artery without angina pectoris: Secondary | ICD-10-CM

## 2015-02-18 DIAGNOSIS — I209 Angina pectoris, unspecified: Secondary | ICD-10-CM

## 2015-02-18 DIAGNOSIS — R0600 Dyspnea, unspecified: Secondary | ICD-10-CM | POA: Diagnosis present

## 2015-02-18 HISTORY — PX: TRANSTHORACIC ECHOCARDIOGRAM: SHX275

## 2015-02-18 LAB — BASIC METABOLIC PANEL
BUN: 14 mg/dL (ref 6–23)
CO2: 30 meq/L (ref 19–32)
CREATININE: 0.8 mg/dL (ref 0.50–1.35)
Calcium: 9.8 mg/dL (ref 8.4–10.5)
Chloride: 99 mEq/L (ref 96–112)
GLUCOSE: 89 mg/dL (ref 70–99)
Potassium: 4.9 mEq/L (ref 3.5–5.3)
Sodium: 137 mEq/L (ref 135–145)

## 2015-02-18 LAB — PROTIME-INR
INR: 0.96 (ref <1.50)
PROTHROMBIN TIME: 12.8 s (ref 11.6–15.2)

## 2015-02-18 LAB — APTT: aPTT: 33 seconds (ref 24–37)

## 2015-02-18 NOTE — Telephone Encounter (Signed)
-----   Message from Leonie Man, MD sent at 02/18/2015 12:59 PM EDT ----- OK for cath

## 2015-02-18 NOTE — Telephone Encounter (Signed)
LEFT MESSAGE LABS ARE GOOD FOR PROCEDURE.

## 2015-02-19 ENCOUNTER — Telehealth: Payer: Self-pay | Admitting: *Deleted

## 2015-02-19 ENCOUNTER — Other Ambulatory Visit: Payer: Self-pay | Admitting: *Deleted

## 2015-02-19 NOTE — Telephone Encounter (Signed)
SPOKE TO WIFE, RESULTS GIVEN. VERBALIZED UNDERSTANDING

## 2015-02-19 NOTE — Telephone Encounter (Signed)
-----   Message from Leonie Man, MD sent at 02/18/2015 10:27 PM EDT ----- Good news, relatively normal echocardiogram.  Overall normal ejection fraction with grade 1 diastolic dysfunction. No change to aortic sclerosis but no stenosis.  This makes me more concerned for possible coronary arteries as opposed to heart failure. We'll proceed with catheterization on Friday but will not need to do the left ventriculogram.   HARDING, Leonie Green, MD

## 2015-02-20 ENCOUNTER — Encounter (HOSPITAL_COMMUNITY): Payer: Self-pay | Admitting: Cardiology

## 2015-02-20 ENCOUNTER — Ambulatory Visit (HOSPITAL_COMMUNITY)
Admission: RE | Admit: 2015-02-20 | Discharge: 2015-02-21 | Disposition: A | Discharge status: Home / Self Care | Payer: 59 | Source: Ambulatory Visit | Attending: Cardiology | Admitting: Cardiology

## 2015-02-20 ENCOUNTER — Encounter (HOSPITAL_COMMUNITY)
Admission: RE | Disposition: A | Discharge status: Home / Self Care | Payer: Medicare Other | Source: Ambulatory Visit | Attending: Cardiology

## 2015-02-20 DIAGNOSIS — Z7982 Long term (current) use of aspirin: Secondary | ICD-10-CM | POA: Insufficient documentation

## 2015-02-20 DIAGNOSIS — Z955 Presence of coronary angioplasty implant and graft: Secondary | ICD-10-CM | POA: Insufficient documentation

## 2015-02-20 DIAGNOSIS — E785 Hyperlipidemia, unspecified: Secondary | ICD-10-CM | POA: Diagnosis not present

## 2015-02-20 DIAGNOSIS — Z8249 Family history of ischemic heart disease and other diseases of the circulatory system: Secondary | ICD-10-CM | POA: Insufficient documentation

## 2015-02-20 DIAGNOSIS — Z01818 Encounter for other preprocedural examination: Secondary | ICD-10-CM

## 2015-02-20 DIAGNOSIS — R0609 Other forms of dyspnea: Secondary | ICD-10-CM | POA: Diagnosis present

## 2015-02-20 DIAGNOSIS — M109 Gout, unspecified: Secondary | ICD-10-CM | POA: Insufficient documentation

## 2015-02-20 DIAGNOSIS — Z7902 Long term (current) use of antithrombotics/antiplatelets: Secondary | ICD-10-CM | POA: Diagnosis not present

## 2015-02-20 DIAGNOSIS — J45909 Unspecified asthma, uncomplicated: Secondary | ICD-10-CM | POA: Diagnosis not present

## 2015-02-20 DIAGNOSIS — K219 Gastro-esophageal reflux disease without esophagitis: Secondary | ICD-10-CM | POA: Insufficient documentation

## 2015-02-20 DIAGNOSIS — N401 Enlarged prostate with lower urinary tract symptoms: Secondary | ICD-10-CM | POA: Diagnosis not present

## 2015-02-20 DIAGNOSIS — N138 Other obstructive and reflux uropathy: Secondary | ICD-10-CM | POA: Diagnosis present

## 2015-02-20 DIAGNOSIS — Z9861 Coronary angioplasty status: Secondary | ICD-10-CM

## 2015-02-20 DIAGNOSIS — Z87891 Personal history of nicotine dependence: Secondary | ICD-10-CM | POA: Diagnosis not present

## 2015-02-20 DIAGNOSIS — I25719 Atherosclerosis of autologous vein coronary artery bypass graft(s) with unspecified angina pectoris: Secondary | ICD-10-CM | POA: Insufficient documentation

## 2015-02-20 DIAGNOSIS — I2582 Chronic total occlusion of coronary artery: Secondary | ICD-10-CM | POA: Diagnosis not present

## 2015-02-20 DIAGNOSIS — I208 Other forms of angina pectoris: Secondary | ICD-10-CM | POA: Diagnosis present

## 2015-02-20 DIAGNOSIS — I209 Angina pectoris, unspecified: Secondary | ICD-10-CM | POA: Diagnosis present

## 2015-02-20 DIAGNOSIS — I2510 Atherosclerotic heart disease of native coronary artery without angina pectoris: Secondary | ICD-10-CM | POA: Diagnosis not present

## 2015-02-20 DIAGNOSIS — I25810 Atherosclerosis of coronary artery bypass graft(s) without angina pectoris: Secondary | ICD-10-CM | POA: Diagnosis present

## 2015-02-20 DIAGNOSIS — I10 Essential (primary) hypertension: Secondary | ICD-10-CM | POA: Diagnosis not present

## 2015-02-20 HISTORY — DX: Dorsalgia, unspecified: M54.9

## 2015-02-20 HISTORY — PX: CARDIAC CATHETERIZATION: SHX172

## 2015-02-20 HISTORY — DX: Calculus of kidney: N20.0

## 2015-02-20 HISTORY — DX: Unspecified osteoarthritis, unspecified site: M19.90

## 2015-02-20 HISTORY — DX: Acute myocardial infarction, unspecified: I21.9

## 2015-02-20 HISTORY — DX: Other chronic pain: G89.29

## 2015-02-20 LAB — POCT ACTIVATED CLOTTING TIME: Activated Clotting Time: 442 seconds

## 2015-02-20 SURGERY — LEFT HEART CATH AND CORONARY ANGIOGRAPHY
Anesthesia: LOCAL

## 2015-02-20 MED ORDER — ALBUTEROL SULFATE (2.5 MG/3ML) 0.083% IN NEBU: 1.0000 mL | INHALATION_SOLUTION | Freq: Four times a day (QID) | RESPIRATORY_TRACT | Status: DC | PRN

## 2015-02-20 MED ORDER — ACETAMINOPHEN 325 MG PO TABS
650.0000 mg | ORAL_TABLET | ORAL | Status: DC | PRN
  Administered 2015-02-20: 12:00:00 650 mg via ORAL
  Filled 2015-02-20: qty 2

## 2015-02-20 MED ORDER — ASPIRIN 81 MG PO CHEW
CHEWABLE_TABLET | ORAL | Status: DC | PRN
  Administered 2015-02-20: 81 mg via ORAL

## 2015-02-20 MED ORDER — BIVALIRUDIN 250 MG IV SOLR
INTRAVENOUS | Status: AC
  Filled 2015-02-20: qty 250

## 2015-02-20 MED ORDER — HEPARIN SODIUM (PORCINE) 1000 UNIT/ML IJ SOLN
INTRAMUSCULAR | Status: AC
  Filled 2015-02-20: qty 1

## 2015-02-20 MED ORDER — IOHEXOL 350 MG/ML SOLN
INTRAVENOUS | Status: DC | PRN
  Administered 2015-02-20: 170 mL via INTRACARDIAC

## 2015-02-20 MED ORDER — ASPIRIN EC 81 MG PO TBEC: 81.0000 mg | DELAYED_RELEASE_TABLET | ORAL | Status: DC

## 2015-02-20 MED ORDER — CLOPIDOGREL BISULFATE 300 MG PO TABS
ORAL_TABLET | ORAL | Status: DC | PRN
  Administered 2015-02-20: 300 mg via ORAL

## 2015-02-20 MED ORDER — CHLORZOXAZONE 500 MG PO TABS
500.0000 mg | ORAL_TABLET | Freq: Two times a day (BID) | ORAL | Status: DC
  Filled 2015-02-20 (×5): qty 1

## 2015-02-20 MED ORDER — PRASUGREL HCL 10 MG PO TABS
10.0000 mg | ORAL_TABLET | Freq: Every day | ORAL | Status: DC
  Filled 2015-02-20: qty 1

## 2015-02-20 MED ORDER — ONDANSETRON HCL 4 MG/2ML IJ SOLN: 4.0000 mg | Freq: Four times a day (QID) | INTRAMUSCULAR | Status: DC | PRN

## 2015-02-20 MED ORDER — FENTANYL CITRATE (PF) 100 MCG/2ML IJ SOLN
INTRAMUSCULAR | Status: AC
  Filled 2015-02-20: qty 2

## 2015-02-20 MED ORDER — HEPARIN (PORCINE) IN NACL 2-0.9 UNIT/ML-% IJ SOLN
INTRAMUSCULAR | Status: AC
  Filled 2015-02-20: qty 1000

## 2015-02-20 MED ORDER — SODIUM CHLORIDE 0.9 % WEIGHT BASED INFUSION: 3.0000 mL/kg/h | INTRAVENOUS | Status: AC

## 2015-02-20 MED ORDER — HEPARIN SODIUM (PORCINE) 1000 UNIT/ML IJ SOLN
INTRAMUSCULAR | Status: DC | PRN
  Administered 2015-02-20: 4500 [IU] via INTRAVENOUS

## 2015-02-20 MED ORDER — SODIUM CHLORIDE 0.9 % IV SOLN
INTRAVENOUS | Status: DC
  Administered 2015-02-20: 08:00:00 via INTRAVENOUS

## 2015-02-20 MED ORDER — VERAPAMIL HCL 2.5 MG/ML IV SOLN
INTRAVENOUS | Status: DC | PRN
  Administered 2015-02-20: 10:00:00 via INTRA_ARTERIAL

## 2015-02-20 MED ORDER — MORPHINE SULFATE 2 MG/ML IJ SOLN: 2.0000 mg | INTRAMUSCULAR | Status: DC | PRN

## 2015-02-20 MED ORDER — PRASUGREL HCL 10 MG PO TABS
60.0000 mg | ORAL_TABLET | Freq: Once | ORAL | Status: AC
Start: 2015-02-20 — End: 2015-02-20
  Administered 2015-02-20: 14:00:00 60 mg via ORAL
  Filled 2015-02-20: qty 6

## 2015-02-20 MED ORDER — SODIUM CHLORIDE 0.9 % IJ SOLN: 3.0000 mL | INTRAMUSCULAR | Status: DC | PRN

## 2015-02-20 MED ORDER — OXYCODONE-ACETAMINOPHEN 10-325 MG PO TABS: 1.0000 | ORAL_TABLET | Freq: Three times a day (TID) | ORAL | Status: DC | PRN

## 2015-02-20 MED ORDER — NITROGLYCERIN 1 MG/10 ML FOR IR/CATH LAB
INTRA_ARTERIAL | Status: AC
  Filled 2015-02-20: qty 10

## 2015-02-20 MED ORDER — OXYCODONE-ACETAMINOPHEN 5-325 MG PO TABS: 1.0000 | ORAL_TABLET | Freq: Three times a day (TID) | ORAL | Status: DC | PRN

## 2015-02-20 MED ORDER — VERAPAMIL HCL 2.5 MG/ML IV SOLN
INTRAVENOUS | Status: AC
  Filled 2015-02-20: qty 2

## 2015-02-20 MED ORDER — ASPIRIN 81 MG PO CHEW
CHEWABLE_TABLET | ORAL | Status: AC
  Filled 2015-02-20: qty 1

## 2015-02-20 MED ORDER — SODIUM CHLORIDE 0.9 % IV SOLN
250.0000 mg | INTRAVENOUS | Status: DC | PRN
  Administered 2015-02-20: 1.75 mg/kg/h via INTRAVENOUS

## 2015-02-20 MED ORDER — SODIUM CHLORIDE 0.9 % IV SOLN: 250.0000 mL | INTRAVENOUS | Status: DC | PRN

## 2015-02-20 MED ORDER — SODIUM CHLORIDE 0.9 % IJ SOLN: 3.0000 mL | Freq: Two times a day (BID) | INTRAMUSCULAR | Status: DC

## 2015-02-20 MED ORDER — OXYCODONE HCL 5 MG PO TABS: 5.0000 mg | ORAL_TABLET | Freq: Three times a day (TID) | ORAL | Status: DC | PRN

## 2015-02-20 MED ORDER — ANGIOPLASTY BOOK
Freq: Once | Status: AC
  Administered 2015-02-20: 20:00:00
  Filled 2015-02-20: qty 1

## 2015-02-20 MED ORDER — MIDAZOLAM HCL 2 MG/2ML IJ SOLN
INTRAMUSCULAR | Status: AC
  Filled 2015-02-20: qty 2

## 2015-02-20 MED ORDER — TAMSULOSIN HCL 0.4 MG PO CAPS: 0.4000 mg | ORAL_CAPSULE | Freq: Every day | ORAL | Status: DC

## 2015-02-20 MED ORDER — BIVALIRUDIN BOLUS VIA INFUSION - CUPID
INTRAVENOUS | Status: DC | PRN
  Administered 2015-02-20: 63.975 mg via INTRAVENOUS

## 2015-02-20 MED ORDER — FENTANYL CITRATE (PF) 100 MCG/2ML IJ SOLN
INTRAMUSCULAR | Status: DC | PRN
  Administered 2015-02-20: 25 ug via INTRAVENOUS

## 2015-02-20 MED ORDER — CLOPIDOGREL BISULFATE 300 MG PO TABS
ORAL_TABLET | ORAL | Status: AC
  Filled 2015-02-20: qty 1

## 2015-02-20 MED ORDER — MIDAZOLAM HCL 2 MG/2ML IJ SOLN
INTRAMUSCULAR | Status: DC | PRN
  Administered 2015-02-20: 2 mg via INTRAVENOUS

## 2015-02-20 MED ORDER — ALPRAZOLAM 0.5 MG PO TABS: 0.5000 mg | ORAL_TABLET | Freq: Every day | ORAL | Status: DC | PRN

## 2015-02-20 SURGICAL SUPPLY — 16 items
BALLN EMERGE MR 2.5X12 (BALLOONS) ×3
BALLN ~~LOC~~ EUPHORA RX 3.0X20 (BALLOONS) ×3
BALLOON EMERGE MR 2.5X12 (BALLOONS) IMPLANT
BALLOON ~~LOC~~ EUPHORA RX 3.0X20 (BALLOONS) IMPLANT
CATH HEARTRAIL IKARI 6F IR1.0 (CATHETERS) ×1 IMPLANT
CATH INFINITI 5FR MULTPACK ANG (CATHETERS) ×1 IMPLANT
DEVICE RAD COMP TR BAND LRG (VASCULAR PRODUCTS) ×3 IMPLANT
GLIDESHEATH SLEND A-KIT 6F 22G (SHEATH) ×3 IMPLANT
KIT ENCORE 26 ADVANTAGE (KITS) ×1 IMPLANT
KIT HEART LEFT (KITS) ×3 IMPLANT
PACK CARDIAC CATHETERIZATION (CUSTOM PROCEDURE TRAY) ×3 IMPLANT
STENT PROMUS PREM MR 2.75X28 (Permanent Stent) ×1 IMPLANT
TRANSDUCER W/STOPCOCK (MISCELLANEOUS) ×3 IMPLANT
TUBING CIL FLEX 10 FLL-RA (TUBING) ×3 IMPLANT
WIRE ASAHI PROWATER 180CM (WIRE) ×1 IMPLANT
WIRE SAFE-T 1.5MM-J .035X260CM (WIRE) ×3 IMPLANT

## 2015-02-20 NOTE — Progress Notes (Signed)
CM spoke with pt/wife in room regarding effient. Pt /wife states has been on effient before, 2014. CM made pt/wife aware benefit check is in process to determine co-pay and prior authorization if needed. Wife states familiar with cost and has no concerns purchasing medication. Informed pt/wife CM will f/u with results of benefits check when results are available. CM called pharmacy([facility]) to confirm medication is in stock, made pt/wife aware. No other needs identified @ present time. Whitman Hero RN,BSN,CM [phone]

## 2015-02-20 NOTE — Progress Notes (Signed)
TR BAND REMOVAL  LOCATION:    right radial  DEFLATED PER PROTOCOL:    Yes.    TIME BAND OFF / DRESSING APPLIED:    1500   SITE UPON ARRIVAL:    Level 0  SITE AFTER BAND REMOVAL:    Level 0   CIRCULATION SENSATION AND MOVEMENT:    Within Normal Limits   Yes.    COMMENTS:  Rechecked site frequently throughout shift with no bleeding or hematoma noted

## 2015-02-20 NOTE — H&P (View-Only) (Signed)
PCP: Delphina Cahill, MD  Clinic Note: Chief Complaint  Patient presents with  . Follow-up     has a lot of fluid renention; no chest pain, has shortness of breath, has edema in legs, feet and ankles, has pain in legs, occassional cramping in legs, occassional lightheadedness, occassional dizziness  . Shortness of Breath    Associated with edema  . Coronary Artery Disease    HPI: [name] is a 74 y.o. male with a PMH below who presents today for an urgent work-in visit for concerning symptoms noted above.  Past Medical History  Diagnosis Date  . Allergic rhinitis   . Asthma   . Family hx of colon cancer   . Gout   . GERD (gastroesophageal reflux disease)   . Hemorrhoids   . Diverticulosis   . Erectile dysfunction   . History of nephrolithiasis   . Asthmatic bronchitis   . Polycythemia   . CAS (cerebral atherosclerosis)     CAROTID DOPPLER, 08/25/2011 - mildly abnormal  . Syncope     2D ECHO, 07/13/2009 - EF >55%, normal  . CAD (coronary artery disease), native coronary artery 07/2004    FATIGUE - PVCs -- CATH -> MV CAD --> CABG X 4  . 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 CAD of autologous vein bypass graft without angina 04/2011    Frequent PVCs & fatigue -> MYOVIEW w/ inferior ischemic with concern for MV distribution --> CATH: only RIMA-LAD patent (LIMA atretic & SVGs occluded) --> Staged PCI  . CAD S/P percutaneous coronary angioplasty 04/2011    PCI- Cx-OM W/ 2 overlapping Resolute DES 3x20mm & 3x35mm stents (3.27mm),  PCI to RCA -  Promus DES 3x20mm (3.22mm);  Marland Kitchen Symptomatic PVCs   . Hyperlipidemia with target LDL less than 70   . Essential hypertension     Prior Cardiac Evaluation and Past Surgical History: Past Surgical History  Procedure Laterality Date  . Cardiac catheterization  07/15/2004    CABG recommended; continue medical therapy  . Coronary artery bypass graft  07/23/2004    LIMA-Cx RIMA-LAD, SVG-diagonal, SVG-PDA  . Cardiac  catheterization  04/26/2011    Patent RIMA-LAD, atretic LIMA-circumflex (previous) 100 and occluded SVG to diagonal and SVG to PDA. LAD 80-90% eccentric stenosis at SP1 that has 70% stenosis. Circumflex: Large OM and another distal branch with 90-95% stenosis followed by lesion in the OM branch. RCA dominant 6% lesion proximally and several old lesions. PDA occluded 70-80% annular lesion in the proximal RCA. 3 of 4 grafts occlu  . Coronary angioplasty with stent placement  04/28/2011    PCI-circumflex OM stented with 2 overlapping Resolute DES 3x108mm and 3x7mm stents post dilated to 3.72mm; PCI to RCA stented with a Promus DES 3x85mm stent dilated to 3.62mm    Interval History:  [name] presents today as an early working visits with complaints of progressively worsening edema and exertional dyspnea. He is not able to walk more than 20-30 feet without getting fairly dyspneic. He denies any associated pressure in his chest. He is quick to point out that he never had that symptom prior to his CABG. He is also noted significantly reduced exercise tolerance and fatigue. He can't make out to the mailbox without having to catch his breath. He denies any PND or orthopnea and only has rare palpitations. He sometimes gets nauseated and dizzy when he goes from lying down sitting up.  Cardiovascular ROS: positive for -  dyspnea on exertion, edema, palpitations, shortness of breath and Decreased exercise tolerance negative for - loss of consciousness, murmur, orthopnea, paroxysmal nocturnal dyspnea, rapid heart rate or TIA/amaurosis fugax. Syncope/near syncope  No claudication.  ROS: A comprehensive was performed. Review of Systems  Constitutional: Positive for fever (Associated with GI bug/flulike illness) and malaise/fatigue (significantly reduced energy with decreased exercise tolerance. Only able to walk about 20-30 feet.).  HENT: Negative for nosebleeds.   Respiratory: Positive for shortness of breath  (associated with wheezing and just difficulty catching his breath. Often exertional but can happen at rest) and wheezing (Intermittently has episodes of wheezing).   Cardiovascular: Positive for palpitations and leg swelling.       Per history of present illness  Gastrointestinal: Positive for nausea and vomiting. Negative for blood in stool and melena.       He had an episode about 2 weeks ago lasting about 1 week with fevers chills diarrhea along with nausea and vomiting. He thinks it was a flulike illness. His labs were checked shortly after that and he had slightly elevated LFTs. He returned to normal 10 days later. He has noted feeling more tired and fatigued since then has never bounced back.  Genitourinary: Negative for dysuria and hematuria (He did have an episode of nephrolithiasis passing a small kidney stone about a week and half ago.).  Musculoskeletal: Positive for back pain.  Neurological: Positive for dizziness (Basically just feels foggy and tired but usually this occurs when he goes from lying down or sitting to standing.).  Psychiatric/Behavioral: Negative for depression. The patient is nervous/anxious.   All other systems reviewed and are negative.   Current Outpatient Prescriptions on File Prior to Visit  Medication Sig Dispense Refill  . albuterol (PROVENTIL HFA;VENTOLIN HFA) 108 (90 BASE) MCG/ACT inhaler Inhale 1 puff into the lungs every 6 (six) hours as needed for wheezing or shortness of breath.    . allopurinol (ZYLOPRIM) 300 MG tablet Take 300 mg by mouth daily.      Marland Kitchen ALPRAZolam (XANAX) 0.5 MG tablet Take 0.5 mg by mouth daily as needed for anxiety. As needed    . aspirin EC 81 MG tablet Take 81 mg by mouth every other day. Alternating with Plavix.    . carvedilol (COREG) 3.125 MG tablet TAKE 1/4 TABLET IN MORNING AND 1/2 TABLET IN THE EVENING 90 tablet 3  . chlorzoxazone (PARAFON) 500 MG tablet Take 500 mg by mouth 2 (two) times daily.   1  . Cholecalciferol (VITAMIN  D) 2000 UNITS CAPS Take 1 capsule by mouth daily.    . clopidogrel (PLAVIX) 75 MG tablet Take 1 tablet every other day alternating with asaprin.    Marland Kitchen ezetimibe-simvastatin (VYTORIN) 10-40 MG per tablet TAKE 0.5 TABLETS BY MOUTH DAILY. 45 tablet 3  . methylPREDNIsolone (MEDROL DOSPACK) 4 MG tablet Take 4 mg by mouth as needed.   0   No current facility-administered medications on file prior to visit.   Allergies  Allergen Reactions  . Meperidine Hcl   . Penicillins     History  Substance Use Topics  . Smoking status: Former Smoker -- 1.00 packs/day for 7 years    Types: Cigarettes    Quit date: 09/06/1963  . Smokeless tobacco: Never Used  . Alcohol Use: No   Family History  Problem Relation Age of Onset  . Heart disease Father 79  . Cancer Father     Colon  . Heart disease Brother   . Cancer Brother  Colon  . Stroke Mother 36  . Hypertension Mother 53  . Hypertension Sister 43  . Hyperlipidemia Sister   . Hypertension Brother 23     Wt Readings from Last 3 Encounters:  02/17/15 87.907 kg (193 lb 12.8 oz)  08/07/14 86.41 kg (190 lb 8 oz)  03/06/14 81.647 kg (180 lb)    PHYSICAL EXAM BP 136/86 mmHg  Pulse 67  Ht 5\' 8"  (1.727 m)  Wt 87.907 kg (193 lb 12.8 oz)  BMI 29.47 kg/m2 General appearance: alert, cooperative, appears stated age, no distress and borderline obese; actually present and affect HEENT: [place]/AT, EOMI, MMM, anicteric sclera Neck: no adenopathy, no carotid bruit and no JVD Lungs: clear to auscultation bilaterally, normal percussion bilaterally and non-labored Heart: regular rate and rhythm, S1 & S2 normal, + S4, soft 1/6 c-d SEM @ RUSB-> carotids; No click, rub; nondisplaced PMI Abdomen: soft, non-tender; bowel sounds normal; no masses,  no organomegaly; mildly protuberant abdomen Extremities: extremities normal, atraumatic, no cyanosis, and edema 1-2+  Right ankle, 1+ left ankle Pulses: 2+ and symmetric;  Skin: mobility and turgor normal    Neurologic: Mental status: Alert, oriented, thought content appropriate Cranial nerves: normal (II-XII grossly intact)   Adult ECG Report  Rate: 67 ;  Rhythm: normal sinus rhythm  Narrative Interpretation: essentially normal EKG with leftward axis of-28, mild J-point elevation in precordial leads T waves.  Recent Labs: 01/12/2015  Na+ 137, K+ 4.8, Cl- 101, HCO3-  29 , BUN 12, Cr 0.93, Glu 99, Ca2+ 9.2; AST 48 (reduced to 23 5/19), ALT 88 (down to 35 on 5/19) AlkP 63, Alb 4.1, TP 6.2, T Bili 0.6  CBC: W 7.1, H/H 14.4/43.6, Plt 255  TC 144, TG 110, HDL 31, LDL 91   ASSESSMENT / PLAN: Problem List Items Addressed This Visit    Angina, class III    He really did not have any typical symptoms of angina in the past. When he did note was profound exertional dyspnea fatigue and exercise intolerance with applications. These are all similar symptoms when he is having now and they're getting progressively worse. I'm concerned that this conglomeration of symptoms are concerning for crescendo class III angina and therefore would prefer to proceed with cardiac catheterization as he has extensive stents in his circumflex and RCA system. We can probably go from right radial as he has a RIMA graft patent with an atretic LIMA graft  Plan: Cardiac cath with native and graft angiography from right radial approach.        Relevant Orders   EKG 12-Lead   ECHOCARDIOGRAM COMPLETE   LEFT HEART CATHETERIZATION WITH CORONARY/GRAFT ANGIOGRAM   APTT   Basic metabolic panel   CBC   Protime-INR   Bilateral lower extremity edema    He's not had this problem before. He doesn't have other heart failure symptoms of PND or orthopnea. He is noticing exertional dyspnea. I will start him on low-dose Lasix at 20 mg daily.  Will check a 2-D echocardiogram and also measured pressures in his left heart catheterization. If the EF is down I may consider adding right heart cath to the planned procedure.      CAD  (coronary artery disease) of artery bypass graft -atretic LIMA- circumflex, occluded SVG-RCA and SVG-diagonal. - Primary (Chronic)    Atretic LIMA-Cx, Patent RIMA-LAD with occluded SVGs -- OK to use R Radial approach.      Relevant Orders   EKG 12-Lead   ECHOCARDIOGRAM COMPLETE   LEFT  HEART CATHETERIZATION WITH CORONARY/GRAFT ANGIOGRAM   APTT   Basic metabolic panel   CBC   Protime-INR   CAD S/P percutaneous coronary angioplasty - PCI of Cx-OM w/ 2 Resolute DES 3.78mm x 15 mm & 3.0 mm x 83mm  (3.62mm); PCI-RCA (Promus Element DES 3.0 mm x 38 mm (3.58mm) (Chronic)    He remains on aspirin plus Plavix. He is on Vytorin with relatively well-controlled lipids noted above. Short of increasing potency of statin, I do not know what more we can do with his lipid control.  He is only on a low-dose beta blocker due to intolerance. He is not on ACE inhibitor or ARB because of issues with hypotension in the past.  Plan LHC       Relevant Orders   EKG 12-Lead   ECHOCARDIOGRAM COMPLETE   LEFT HEART CATHETERIZATION WITH CORONARY/GRAFT ANGIOGRAM   APTT   Basic metabolic panel   CBC   Protime-INR   DOE (dyspnea on exertion)    Again I think this may very well be an anginal problem however he is not have an echo checked in a while. I like to see what his EF  Is as well as filling pressures. He does have a soft murmur but as I recall that is mostly from aortosclerosis pressures not worse. Would also like to see what his diastolic pressures are. No wheezing for make it unnecessary to check an LVgram and conserve contrast during catheterization.  Echo; LHC      Relevant Orders   EKG 12-Lead   ECHOCARDIOGRAM COMPLETE   LEFT HEART CATHETERIZATION WITH CORONARY/GRAFT ANGIOGRAM   APTT   Basic metabolic panel   CBC   Protime-INR   Hyperlipidemia with target LDL less than 70 (Chronic)    On Vytorin. Labs look relatively well-controlled. Depending on his cath looks like we may want to be more aggressive  in the future. He Urso the medications and for now I would just keep him stable.      PVC's (premature ventricular contractions) (Chronic)    Only able to tolerate low-dose beta blocker. The PVCs have been relatively rare, but when they start coming back it is more consistent when he is having problems with CAD.       Other Visit Diagnoses    Pre-op testing        Relevant Orders    APTT    Basic metabolic panel    CBC    Protime-INR    Blood clotting disorder        Relevant Orders    APTT    Protime-INR       Orders Placed This Encounter  Procedures  . APTT  . Basic metabolic panel  . CBC  . Protime-INR  . EKG 12-Lead  . ECHOCARDIOGRAM COMPLETE    Standing Status: Future     Number of Occurrences:      Standing Expiration Date: 05/18/2016    Order Specific Question:  Where should this test be performed    Answer:  [facility]    Order Specific Question:  Complete or Limited study?    Answer:  Complete    Order Specific Question:  With Image Enhancing Agent or without Image Enhancing Agent?    Answer:  With Image Enhancing Agent    Order Specific Question:  Reason for exam-Echo    Answer:  Dyspnea  786.09 / R06.00    Order Specific Question:  Reason for exam-Echo    Answer:  Chest Pain  786.50 / R07.9  . LEFT HEART CATHETERIZATION WITH CORONARY/GRAFT ANGIOGRAM    Followup: post cath    Leonie Man, M.D., M.S. Interventional Cardiologist   Pager # [phone]

## 2015-02-20 NOTE — Interval H&P Note (Signed)
History and Physical Interval Note:  02/20/2015 1:59 PM  [name]  has presented today for surgery, with the diagnosis of Class III Angina CAD- s/p PCI   The various methods of treatment have been discussed with the patient and family. After consideration of risks, benefits and other options for treatment, the patient has consented to  Procedure(s): Left Heart Cath and Coronary Angiography (N/A) Percutaneous Coronary Stent Intervention (Pci-S) as a surgical intervention .  The patient's history has been reviewed, patient examined, no change in status, stable for surgery.  I have reviewed the patient's chart and labs.  Questions were answered to the patient's satisfaction.     [place], [place]  Cath Lab Visit (complete for each Cath Lab visit)  Clinical Evaluation Leading to the Procedure:   ACS: No.  Non-ACS:    Anginal Classification: CCS III  Anti-ischemic medical therapy: Minimal Therapy (1 class of medications)  Non-Invasive Test Results: No non-invasive testing performed  Prior CABG: Previous CABG  AUC - unable to calculate.  Leonie Man, MD

## 2015-02-21 DIAGNOSIS — I2582 Chronic total occlusion of coronary artery: Secondary | ICD-10-CM | POA: Diagnosis not present

## 2015-02-21 DIAGNOSIS — I25719 Atherosclerosis of autologous vein coronary artery bypass graft(s) with unspecified angina pectoris: Secondary | ICD-10-CM | POA: Diagnosis not present

## 2015-02-21 DIAGNOSIS — J45909 Unspecified asthma, uncomplicated: Secondary | ICD-10-CM | POA: Diagnosis not present

## 2015-02-21 DIAGNOSIS — I208 Other forms of angina pectoris: Secondary | ICD-10-CM

## 2015-02-21 DIAGNOSIS — M109 Gout, unspecified: Secondary | ICD-10-CM | POA: Diagnosis not present

## 2015-02-21 LAB — BASIC METABOLIC PANEL
ANION GAP: 9 (ref 5–15)
BUN: 11 mg/dL (ref 6–20)
CALCIUM: 9.1 mg/dL (ref 8.9–10.3)
CO2: 27 mmol/L (ref 22–32)
Chloride: 101 mmol/L (ref 101–111)
Creatinine, Ser: 0.95 mg/dL (ref 0.61–1.24)
Glucose, Bld: 105 mg/dL — ABNORMAL HIGH (ref 65–99)
Potassium: 4.3 mmol/L (ref 3.5–5.1)
SODIUM: 137 mmol/L (ref 135–145)

## 2015-02-21 LAB — CBC
HEMATOCRIT: 42.5 % (ref 39.0–52.0)
Hemoglobin: 14.3 g/dL (ref 13.0–17.0)
MCH: 28.4 pg (ref 26.0–34.0)
MCHC: 33.6 g/dL (ref 30.0–36.0)
MCV: 84.5 fL (ref 78.0–100.0)
PLATELETS: 239 10*3/uL (ref 150–400)
RBC: 5.03 MIL/uL (ref 4.22–5.81)
RDW: 13.5 % (ref 11.5–15.5)
WBC: 8.8 10*3/uL (ref 4.0–10.5)

## 2015-02-21 LAB — GLUCOSE, CAPILLARY: Glucose-Capillary: 121 mg/dL — ABNORMAL HIGH (ref 65–99)

## 2015-02-21 MED ORDER — PRASUGREL HCL 10 MG PO TABS: 10.0000 mg | ORAL_TABLET | Freq: Every day | ORAL | Status: DC

## 2015-02-21 MED ORDER — ASPIRIN EC 81 MG PO TBEC: 81.0000 mg | DELAYED_RELEASE_TABLET | Freq: Every day | ORAL | Status: DC

## 2015-02-21 NOTE — Progress Notes (Signed)
Pt came back from the bathroom @ 00:45 am feeling like he is going to pass out, diaphoretic, dizzy & while he was in the bathroom he felt tightness in his stomach, no chest discomfort or pain; Negative for SOB. Pt placed  back to bed. BP=121/70; HR=77;R=17; O2 Sats=98% on RA. Abdomen all 4 quadrant active BMS ,soft, negative tenderness. EKG done without changes. CBG=121 . Dr. Posey Pronto informed. Orthostatic BP taken as ordered. BP(Lying)138/75 HR=71; BP(sitting) 139/73 HR=72; BP(Standing) 133/81 HR=80. Presently pt feels better. Will continue to monitor pt .

## 2015-02-21 NOTE — Progress Notes (Signed)
CARDIAC REHAB PHASE I   PRE:  Rate/Rhythm: 77 SR  BP:  Supine: 149/81  Sitting:   Standing:    SaO2:   MODE:  Ambulation: 500 ft   POST:  Rate/Rhythm: 91 SR  BP:  Supine:   Sitting: 150/77  Standing:    SaO2:  0730-0827 Pt walked 500 ft with steady gait. No CP or SOB. Tolerated well. Education completed with pt and wife who voiced understanding. Discussed CRP 2 and permission given to refer to [facility] program. Stressed importance of effient with stent. Reviewed NTG use and encouraged him to get new bottle as his is years old.   Graylon Good, RN BSN  02/21/2015 8:23 AM

## 2015-02-21 NOTE — Discharge Summary (Signed)
Physician Discharge Summary  Patient ID: [name] MRN: [id number] DOB/AGE: [birth year] 74 y.o.   Primary Cardiologist: Dr. Ellyn Hack  Admit date: 02/20/2015 Discharge date: 02/21/2015  Admission Diagnoses: Angina, Class III  Discharge Diagnoses:  Principal Problem:   Angina, class III Active Problems:   BPH (benign prostatic hypertrophy) with urinary obstruction   CAD (coronary artery disease) of artery bypass graft -atretic LIMA- circumflex, occluded SVG-RCA and SVG-diagonal.   CAD S/P percutaneous coronary angioplasty - PCI of Cx-OM w/ 2 Resolute DES 3.20mm x 15 mm & 3.0 mm x 50mm  (3.16mm); PCI-RCA (Promus Element DES 3.0 mm x 38 mm (3.43mm)   Hyperlipidemia with target LDL less than 70   DOE (dyspnea on exertion)   Discharged Condition: stable  Hospital Course: 74 y/o male, followed by Dr. Ellyn Hack, with a h/o CAD s/p CABG, admitted to [facility] on 02/20/15 for planned LHC due to recurrent class III angina. The procedure was performed by Dr. Ellyn Hack. He was found to have multivessel CAD with widely patent stents in Prox-mid RCA, Mid Cx as well as Patent RIMA-LAD. There was progression of disease in the distal RCA into RPAV (see complete cath details below). He underwent successful distal RCA into RPDA PCI utilizing a DES. He tolerated the procedure well and left the cath lab in stable condition. He was placed on DAPT with ASA + Effient. He was continued on BB and statin therapy. He had no recurrent CP and no post cath complications. He had no difficulties ambulating. HR and BP remained stable. NSR on telemetry. He was last seen and examined by Dr. Lovena Le who determined he was stable for discharge home. Post-hospital f/u has been arranged with Dr. Ellyn Hack.    Consults: None  Significant Diagnostic Studies:  LHC 02/20/15 Conclusion    1. Multivessel CAD with widely patent stents in Prox-mid RCA, Mid Cx as well as Patent RIMA-LAD. There was progression of disease in the distal RCA into  RPAV 2. Dist RCA lesion, 50% stenosed. Post Atrio lesion, 80% stenosed. A Promus Premier drug-eluting stent was placed. There is a 0% residual stenosis post intervention. There was 0% stenosis post intervention 3. 1st RPLB lesion, 100% stenosed. 4. Ost LAD lesion, 99% stenosed. Mid LAD to Dist LAD lesion, 100% stenosed. The lesion was previously treated, greater than two years ago. 2005 RIMA-LAD 5. RIMA-LAD is large, and is anatomically normal. Distal LAD is a major wraparound vessel that essentially perfuses the rPDA territory. 6. Ost RCA to Mid RCA lesion, 5% stenosed. A drug-eluting stent was placed. The lesion was previously treated with a drug-eluting stent greater than two years ago. 04/2011 7. Both SVG-RCA & OM are 100%. The LIMA-Diag was known to be atretic & not injected. 8. Low Normal LV Function, with normal EDP     Treatments: See Hospital Course  Discharge Exam: Blood pressure 149/81, pulse 75, temperature 98.3 F (36.8 C), temperature source Oral, resp. rate 16, height 5\' 8"  (1.727 m), weight 192 lb 7.4 oz (87.3 kg), SpO2 96 %.   Disposition: 01-Home or Self Care      Discharge Instructions    Amb Referral to Cardiac Rehabilitation    Complete by:  As directed   Congestive Heart Failure: If diagnosis is Heart Failure, patient MUST meet each of the CMS criteria: 1. Left Ventricular Ejection Fraction </= 35% 2. NYHA class II-IV symptoms despite being on optimal heart failure therapy for at least 6 weeks. 3. Stable = have not had a recent (<6 weeks)  or planned (<6 months) major cardiovascular hospitalization or procedure  Program Details: - Physician supervised classes - 1-3 classes per week over a 12-18 week period, generally for a total of 36 sessions  Physician Certification: I certify that the above Cardiac Rehabilitation treatment is medically necessary and is medically approved by me for treatment of this patient. The patient is willing and cooperative, able to  ambulate and medically stable to participate in exercise rehabilitation. The participant's progress and Individualized Treatment Plan will be reviewed by the Medical Director, Cardiac Rehab staff and as indicated by the Referring/Ordering Physician.  Diagnosis:  PCI     Diet - low sodium heart healthy    Complete by:  As directed      Increase activity slowly    Complete by:  As directed             Medication List    STOP taking these medications        clopidogrel 75 MG tablet  Commonly known as:  PLAVIX      TAKE these medications        albuterol 108 (90 BASE) MCG/ACT inhaler  Commonly known as:  PROVENTIL HFA;VENTOLIN HFA  Inhale 1 puff into the lungs every 6 (six) hours as needed for wheezing or shortness of breath.     ALPRAZolam 0.5 MG tablet  Commonly known as:  XANAX  Take 0.5 mg by mouth daily as needed for anxiety. As needed     aspirin EC 81 MG tablet  Take 1 tablet (81 mg total) by mouth daily.     carvedilol 3.125 MG tablet  Commonly known as:  COREG  TAKE 1/4 TABLET IN MORNING AND 1/2 TABLET IN THE EVENING     chlorzoxazone 500 MG tablet  Commonly known as:  PARAFON  Take 500 mg by mouth 2 (two) times daily.     ezetimibe-simvastatin 10-40 MG per tablet  Commonly known as:  VYTORIN  TAKE 0.5 TABLETS BY MOUTH DAILY.     mometasone 0.1 % cream  Commonly known as:  ELOCON  Apply to affected area three times a day     oxyCODONE-acetaminophen 10-325 MG per tablet  Commonly known as:  PERCOCET  Take 1 tablet by mouth as needed.     prasugrel 10 MG Tabs tablet  Commonly known as:  EFFIENT  Take 1 tablet (10 mg total) by mouth daily.     prasugrel 10 MG Tabs tablet  Commonly known as:  EFFIENT  Take 1 tablet (10 mg total) by mouth daily.     tamsulosin 0.4 MG Caps capsule  Commonly known as:  FLOMAX  Take 1 capsule by mouth as needed.     Vitamin D 2000 UNITS Caps  Take 1 capsule by mouth daily.       Follow-up Information    Follow up  with Leonie Man, MD On 03/25/2015.   Specialty:  Cardiology   Why:  3:00 pm   Contact information:   [address] [place] [address] [phone]      TIME SPENT ON DISCHARGE, INCLUDING PHYSICIAN TIME: >30 MINUTES Signed: Lyda Jester 02/21/2015, 8:58 AM  Cardiology Attending  Patient seen and examined. Agree with above.  Mikle Bosworth.D.

## 2015-02-21 NOTE — Care Management Note (Signed)
Case Management Note  Patient Details  Name: [name] MRN: [id number] Date of Birth: 1941-05-24  Subjective/Objective:                   Percutaneous Coronary Stent Intervention (Pci-S) Action/Plan:  Discharge planning Expected Discharge Date:  02/21/15               Expected Discharge Plan:  Home/Self Care  In-House Referral:     Discharge planning Services  CM Consult, Medication Assistance  Post Acute Care Choice:    Choice offered to:     DME Arranged:    DME Agency:     HH Arranged:    [place] Agency:     Status of Service:     Medicare Important Message Given:    Date Medicare IM Given:    Medicare IM give by:    Date Additional Medicare IM Given:    Additional Medicare Important Message give by:     If discussed at [place] of Stay Meetings, dates discussed:    Additional Comments: CM received a call to please provide an Effient free 30 day card for pt; however, pt already had card and had been educated by pharmacy.  No other CM needs were communicated. Dellie Catholic, RN 02/21/2015, 9:29 AM

## 2015-02-21 NOTE — Progress Notes (Signed)
Patient Profile: 74 y/o male with h/o CAD s/p CABG admitted for LHC to evaluate for chest pain.  Subjective: No recurrent CP. No dyspnea. Hasn't ambulated the halls yet. Denies any complications with right radial cath site.   Objective: Vital signs in last 24 hours: Temp:  [97.8 F (36.6 C)-98.5 F (36.9 C)] 98.5 F (36.9 C) (06/18 0426) Pulse Rate:  [0-86] 86 (06/18 0426) Resp:  [0-32] 11 (06/18 0426) BP: (121-172)/(56-97) 131/74 mmHg (06/18 0426) SpO2:  [0 %-100 %] 96 % (06/18 0426) Weight:  [192 lb 7.4 oz (87.3 kg)] 192 lb 7.4 oz (87.3 kg) (06/18 0018)    Intake/Output from previous day: 06/17 0701 - 06/18 0700 In: 520 [P.O.:520] Out: 2350 [Urine:2350] Intake/Output this shift:    Medications Current Facility-Administered Medications  Medication Dose Route Frequency Provider Last Rate Last Dose  . 0.9 %  sodium chloride infusion  250 mL Intravenous PRN Leonie Man, MD      . acetaminophen (TYLENOL) tablet 650 mg  650 mg Oral Q4H PRN Leonie Man, MD   650 mg at 02/20/15 1130  . albuterol (PROVENTIL) (2.5 MG/3ML) 0.083% nebulizer solution 1 mL  1 mL Inhalation Q6H PRN Leonie Man, MD      . ALPRAZolam Duanne Moron) tablet 0.5 mg  0.5 mg Oral Daily PRN Leonie Man, MD      . aspirin EC tablet 81 mg  81 mg Oral See admin instructions Leonie Man, MD   0 mg at 02/20/15 1116  . chlorzoxazone (PARAFON) tablet 500 mg  500 mg Oral BID Leonie Man, MD   500 mg at 02/20/15 1200  . morphine 2 MG/ML injection 2 mg  2 mg Intravenous Q1H PRN Leonie Man, MD      . ondansetron [facility]) injection 4 mg  4 mg Intravenous Q6H PRN Leonie Man, MD      . oxyCODONE-acetaminophen (PERCOCET/ROXICET) 5-325 MG per tablet 1 tablet  1 tablet Oral Q8H PRN Leonie Man, MD       And  . oxyCODONE (Oxy IR/ROXICODONE) immediate release tablet 5 mg  5 mg Oral Q8H PRN Leonie Man, MD      . prasugrel (EFFIENT) tablet 10 mg  10 mg Oral Daily Leonie Man, MD      .  sodium chloride 0.9 % injection 3 mL  3 mL Intravenous Q12H Leonie Man, MD      . sodium chloride 0.9 % injection 3 mL  3 mL Intravenous PRN Leonie Man, MD      . tamsulosin [facility]) capsule 0.4 mg  0.4 mg Oral Daily Leonie Man, MD   0.4 mg at 02/20/15 1200    PE: General appearance: alert, cooperative and no distress Neck: no carotid bruit and no JVD Lungs: clear to auscultation bilaterally Heart: regular rate and rhythm, S1, S2 normal, no murmur, click, rub or gallop Extremities: no LEE Pulses: 2+ and symmetric Skin: warm and dry Neurologic: Grossly normal  Lab Results:  No results for input(s): WBC, HGB, HCT, PLT in the last 72 hours. BMET No results for input(s): NA, K, CL, CO2, GLUCOSE, BUN, CREATININE, CALCIUM in the last 72 hours. PT/INR No results for input(s): LABPROT, INR in the last 72 hours. Cholesterol No results for input(s): CHOL in the last 72 hours. Cardiac Enzymes Invalid input(s): TROPONIN,  [facility]  Studies/Results: LHC 02/20/15  Conclusion    1. Multivessel CAD with widely patent stents in  Prox-mid RCA, Mid Cx as well as Patent RIMA-LAD. There was progression of disease in the distal RCA into RPAV 2. Dist RCA lesion, 50% stenosed. Post Atrio lesion, 80% stenosed. A Promus Premier drug-eluting stent was placed. There is a 0% residual stenosis post intervention. There was 0% stenosis post intervention 3. 1st RPLB lesion, 100% stenosed. 4. Ost LAD lesion, 99% stenosed. Mid LAD to Dist LAD lesion, 100% stenosed. The lesion was previously treated, greater than two years ago. 2005 RIMA-LAD 5. RIMA-LAD is large, and is anatomically normal. Distal LAD is a major wraparound vessel that essentially perfuses the rPDA territory. 6. Ost RCA to Mid RCA lesion, 5% stenosed. A drug-eluting stent was placed. The lesion was previously treated with a drug-eluting stent greater than two years ago. 04/2011 7. Both SVG-RCA & OM are 100%. The LIMA-Diag was known to be  atretic & not injected. 8. Low Normal LV Function, with normal EDP  Successful distal RCA into RPDA V PCI of progression of disease.     Assessment/Plan  Principal Problem:   Angina, class III Active Problems:   BPH (benign prostatic hypertrophy) with urinary obstruction   CAD (coronary artery disease) of artery bypass graft -atretic LIMA- circumflex, occluded SVG-RCA and SVG-diagonal.   CAD S/P percutaneous coronary angioplasty - PCI of Cx-OM w/ 2 Resolute DES 3.27mm x 15 mm & 3.0 mm x 63mm  (3.95mm); PCI-RCA (Promus Element DES 3.0 mm x 38 mm (3.69mm)   Hyperlipidemia with target LDL less than 70   DOE (dyspnea on exertion)   1. CAD/ CP: s/p prior CABG. LHC yesterday showed multivessel CAD with widely patent stents in Prox-mid RCA, Mid Cx as well as Patent RIMA-LAD. There was progression of disease in the distal RCA into RPAV. S/P  successful distal RCA into RPDA PCI with DES. No recurrent CP. NSR on telemetry. HR and BP both stable. BMP pending. Will have patient ambulate prior to discharge to ensure no recurrent exertional symptoms. Continue DAPT with ASA + Effient, for at least 1 year, given newly placed DES.  Continue statin (Vytorin at home) and BB (coreg).   Dispo: likely discharge home today after MD assessment. F/u with Dr. Ellyn Hack.    Brittainy M. Ladoris Dontavion 02/21/2015 7:32 AM  Cardiology Attending  Patient seen and examined. Agree with the findings as noted above by Brittainy Simmons,PA-C.   Mikle Bosworth.D.

## 2015-02-23 MED FILL — Heparin Sodium (Porcine) 2 Unit/ML in Sodium Chloride 0.9%: INTRAMUSCULAR | Qty: 1000 | Status: AC

## 2015-02-23 MED FILL — Nitroglycerin IV Soln 100 MCG/ML in D5W: INTRA_ARTERIAL | Qty: 10 | Status: AC

## 2015-03-02 ENCOUNTER — Telehealth: Payer: Self-pay | Admitting: Cardiology

## 2015-03-02 ENCOUNTER — Other Ambulatory Visit: Payer: Self-pay | Admitting: Cardiology

## 2015-03-02 NOTE — Telephone Encounter (Signed)
Returning your call. °

## 2015-03-02 NOTE — Telephone Encounter (Signed)
Spoke to patient. patient states back of right arm  from elbow to behind of shoulder  Dull ache especially if if moves it. Patient states he was very careful the first week. He wanted to know if this was normal. NO DISCOLORATION WITH ARM RN patient may haves some but will defer to Dr Ellyn Hack.

## 2015-03-02 NOTE — Telephone Encounter (Signed)
LEFT MESSAGE TO CALL BACK

## 2015-03-02 NOTE — Telephone Encounter (Signed)
Pt had a Cath and stent put in on 02-20-15. Please call,said he had som questions.

## 2015-03-02 NOTE — Telephone Encounter (Signed)
Rx(s) sent to pharmacy electronically.  

## 2015-03-03 NOTE — Telephone Encounter (Signed)
INFORMED PATIENT AWAITING FOR INSTRUCTIONS ,

## 2015-03-04 NOTE — Telephone Encounter (Signed)
Informed information to patient  He verbalized understanding and thanks.

## 2015-03-04 NOTE — Telephone Encounter (Signed)
Sometimes you can have some discomfort in the arm - more from the positioning on the table than the actually catheter (esp. If on the back of the arm).  Can also be a little internal bruising.  Would use Ice Pack after using the arm & heat pack before using it.   Sx should get better in a week or so.  [place]

## 2015-03-24 ENCOUNTER — Ambulatory Visit: Payer: Medicare Other | Admitting: Cardiology

## 2015-03-25 ENCOUNTER — Ambulatory Visit: Payer: Medicare Other | Admitting: Cardiology

## 2015-03-26 ENCOUNTER — Encounter: Payer: Self-pay | Admitting: Cardiology

## 2015-03-26 ENCOUNTER — Ambulatory Visit (INDEPENDENT_AMBULATORY_CARE_PROVIDER_SITE_OTHER): Payer: 59 | Admitting: Cardiology

## 2015-03-26 VITALS — BP 138/80 | HR 74 | Ht 68.0 in | Wt 196.0 lb

## 2015-03-26 DIAGNOSIS — I2510 Atherosclerotic heart disease of native coronary artery without angina pectoris: Secondary | ICD-10-CM | POA: Diagnosis not present

## 2015-03-26 DIAGNOSIS — R600 Localized edema: Secondary | ICD-10-CM

## 2015-03-26 DIAGNOSIS — I25708 Atherosclerosis of coronary artery bypass graft(s), unspecified, with other forms of angina pectoris: Secondary | ICD-10-CM

## 2015-03-26 DIAGNOSIS — Z9861 Coronary angioplasty status: Secondary | ICD-10-CM

## 2015-03-26 DIAGNOSIS — I493 Ventricular premature depolarization: Secondary | ICD-10-CM | POA: Diagnosis not present

## 2015-03-26 DIAGNOSIS — R0609 Other forms of dyspnea: Secondary | ICD-10-CM

## 2015-03-26 DIAGNOSIS — I209 Angina pectoris, unspecified: Secondary | ICD-10-CM

## 2015-03-26 DIAGNOSIS — E785 Hyperlipidemia, unspecified: Secondary | ICD-10-CM

## 2015-03-26 DIAGNOSIS — I208 Other forms of angina pectoris: Secondary | ICD-10-CM | POA: Diagnosis not present

## 2015-03-26 NOTE — Patient Instructions (Signed)
CALL BACK TO OFFICE WITH IN SYMPTOMS  CONTINUE WITH CURRENT MEDICATIONS   Your physician wants you to follow-up in  6  [address]. [place].  You will receive a reminder letter in the mail two months in advance. If you don't receive a letter, please call our office to schedule the follow-up appointment.

## 2015-03-26 NOTE — Progress Notes (Signed)
PCP: Delphina Cahill, MD  Clinic Note: Chief Complaint  Patient presents with  . POST CATH F/U    Patient has been SOB.no chestpain , no swelling  . Coronary Artery Disease    Says was CABG and PCI. Recent PCI to distal RCA/RPAV    HPI: [name] is a 74 y.o. male with a PMH below who presents today for post cardiac catheterization follow-up..  [name] was last seen on 02/17/2015: He is noting fluid retention exertional dyspnea and edema. Dizziness, etc. All symptoms that correlated with prior CAD related symptoms. Mostly notable for profound exertional dyspnea less than 20-30 feet. No exertional chest pain however. Based on his symptoms and prior history, he was boarded for cardiac catheterization.  Recent Hospitalizations: 02/20/2015 for cardiac catheterization.  Studies Reviewed:  Transthoracic Echo 02/18/2015: Mild concentric LVH, EF 55-60%, no RWMA, Gr 1 DD, aortic sclerosis without stenosis  Cardiac Catheterization 02/20/2015:  Conclusion     Multivessel CAD with widely patent stents in Prox-mid RCA, Mid Cx as well as Patent RIMA-LAD. Dist RCA lesion, 50% stenosed.There was progression of disease in the distal RCA into RPAV   Post Atrio lesion, 80% stenosed. A Promus Premier 2.75X28 (3.0 mm) drug-eluting stent was placed. There is a 0% residual stenosis post intervention. There was 0% stenosis post intervention  1st RPLB lesion, 100% stenosed.   Ost LAD lesion, 99% stenosed. Mid LAD to Dist LAD lesion, 100% stenosed. The lesion was previously treated, greater than two years ago. 2005 RIMA-LAD  RIMA-LAD is large, and is anatomically normal. Distal LAD is a major wraparound vessel that essentially perfuses the rPDA territory.  Ost RCA to Mid RCA patent DES; patent overlapping stents in circumflex  Both SVG-RCA & OM are 100%. The LIMA-Diag was known to be atretic & not injected.  Low Normal LV Function, with normal EDP  Successful distal RCA into RPDA w/ PCI of  progression of disease.    Interval History: [name] presents today in good spirits and smiling. He said that for couple days following his cath he did not feel well, but after that he's been doing quite well. He is noting that he is able to walk and do his routine activities without difficulty. No further exertional dyspnea. The dizziness is improved as has the lower TIMI edema. He has noted a little fullness in his abdomen with mild large and edema on occasion. Not sure if he may need a occasional mild when necessary Lasix. No PND or orthopnea. No resting or exertional chest tightness/pressure to suggest angina.  Remainder of Cardiovascular ROS: no chest pain or dyspnea on exertion positive for - edema and Rare palpitations negative for - irregular heartbeat, loss of consciousness, murmur, orthopnea, paroxysmal nocturnal dyspnea, rapid heart rate, shortness of breath or TIA/amaurosis fugax, near syncope or syncope  Past Medical History  Diagnosis Date  . Allergic rhinitis   . Asthma   . Family hx of colon cancer   . Gout   . GERD (gastroesophageal reflux disease)   . Hemorrhoids   . Diverticulosis   . Erectile dysfunction   . Asthmatic bronchitis   . Polycythemia   . CAS (cerebral atherosclerosis)     CAROTID DOPPLER, 08/25/2011 - mildly abnormal  . Syncope     2D ECHO, 07/13/2009 - EF >55%, normal  . CAD (coronary artery disease), native coronary artery 07/2004    FATIGUE - PVCs -- CATH -> MV CAD --> CABG X 4  .  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 CAD of autologous vein bypass graft without angina 04/2011    Frequent PVCs & fatigue -> MYOVIEW w/ inferior ischemic with concern for MV distribution --> CATH: only RIMA-LAD patent (LIMA atretic & SVGs occluded) --> Staged PCI  . CAD S/P percutaneous coronary angioplasty 04/2011; 02/20/2015    a) PCI- Cx-OM W/ 2 overlapping Resolute DES 3x58mm & 3x48mm stents (3.40mm),  PCI to RCA -  Promus DES 3x22mm  (3.50mm);;; b) 6/'16: PCI of dRCA-rPAV Promus Premier DES 2.75X28 (3.0 mm)  . Symptomatic PVCs   . Hyperlipidemia with target LDL less than 70   . Essential hypertension   . Kidney stones   . Myocardial infarction 07/2009  . Arthritis     "entire body" (02/19/2015)  . Chronic back pain     Past Surgical History  Procedure Laterality Date  . Cardiac catheterization  07/15/2004    CABG recommended; continue medical therapy  . Cardiac catheterization  04/26/2011    Patent RIMA-LAD, atretic LIMA-circumflex (previous) 100 and occluded SVG to diagonal and SVG to PDA. LAD 80-90% eccentric stenosis at SP1 that has 70% stenosis. Circumflex: Large OM and another distal branch with 90-95% stenosis followed by lesion in the OM branch. RCA dominant 6% lesion proximally and several old lesions. PDA occluded 70-80% annular lesion in the proximal RCA. 3 of 4 grafts occlu  . Coronary angioplasty with stent placement  04/28/2011; 02/20/2015    PCI-Cx-OM 2 overlapping Resolute DES 3 mm x18mm and 3x62mm --> 3.71mm; PCI to RCA - Promus Element DES 3x65mm --> 3.9mm;; b) dRCA-rPAV: Promus Premier DES 2.28mm X 28 mm (3.0 mm)   . Cardiac catheterization N/A 02/20/2015    Procedure: Left Heart Cath and Coronary Angiography;  Surgeon: Leonie Man, MD;  Location: [facility];  Service: Cardiovascular;  Laterality: N/A;  . Coronary artery bypass graft  07/23/2004    LIMA-Cx RIMA-LAD, SVG-diagonal, SVG-PDA  . Cataract extraction w/ intraocular lens  implant, bilateral Bilateral ~ 2006/2007  . Transurethral resection of prostate  ~ 2002/2003  . Transthoracic echocardiogram  02/18/2015     Mild concentric LVH, EF 55-60%, no RWMA, Gr 1 DD, aortic sclerosis without stenosis   ROS: A comprehensive was performed. Review of Systems  Constitutional: Negative for malaise/fatigue.  HENT: Negative for nosebleeds.   Respiratory: Positive for shortness of breath (Mild baseline exertional dyspnea).   Cardiovascular:  Positive for leg swelling. Negative for claudication.       Per history of present illness  Gastrointestinal: Negative for blood in stool and melena.  Genitourinary: Negative for hematuria.       No major issues with urination following most recent procedure  Musculoskeletal: Positive for joint pain. Negative for myalgias.  Neurological: Negative for headaches.  Endo/Heme/Allergies: Does not bruise/bleed easily.  All other systems reviewed and are negative.   Current Outpatient Prescriptions on File Prior to Visit  Medication Sig Dispense Refill  . albuterol (PROVENTIL HFA;VENTOLIN HFA) 108 (90 BASE) MCG/ACT inhaler Inhale 1 puff into the lungs every 6 (six) hours as needed for wheezing or shortness of breath.    . ALPRAZolam (XANAX) 0.5 MG tablet Take 0.5 mg by mouth daily as needed for anxiety. As needed    . aspirin EC 81 MG tablet Take 1 tablet (81 mg total) by mouth daily.    . carvedilol (COREG) 3.125 MG tablet TAKE 1/4 TABLET IN MORNING AND 1/2 TABLET IN THE EVENING  90 tablet 3  . chlorzoxazone (PARAFON) 500 MG tablet Take 500 mg by mouth 2 (two) times daily.   1  . Cholecalciferol (VITAMIN D) 2000 UNITS CAPS Take 1 capsule by mouth daily.    Marland Kitchen EFFIENT 10 MG TABS tablet TAKE 1 TABLET (10 MG TOTAL) BY MOUTH DAILY. 30 tablet 6  . ezetimibe-simvastatin (VYTORIN) 10-40 MG per tablet TAKE 0.5 TABLETS BY MOUTH DAILY. 45 tablet 3  . mometasone (ELOCON) 0.1 % cream Apply to affected area three times a day  2  . oxyCODONE-acetaminophen (PERCOCET) 10-325 MG per tablet Take 1 tablet by mouth as needed.    . prasugrel (EFFIENT) 10 MG TABS tablet Take 1 tablet (10 mg total) by mouth daily. 30 tablet 11  . tamsulosin (FLOMAX) 0.4 MG CAPS capsule Take 1 capsule by mouth as needed.     No current facility-administered medications on file prior to visit.   Allergies  Allergen Reactions  . Meperidine Hcl   . Penicillins     History   Social History  . Marital Status: Married    Spouse  Name: N/A  . Number of Children: N/A  . Years of Education: N/A   Occupational History  . MANAGER [employer]   Social History Main Topics  . Smoking status: Former Smoker -- 1.00 packs/day for 7 years    Types: Cigarettes    Quit date: 09/06/1963  . Smokeless tobacco: Never Used  . Alcohol Use: No  . Drug Use: No  . Sexual Activity: Not on file     Comment: 02/20/2015 refuses to answer sexually active   Other Topics Concern  . Not on file   Social History Narrative   He is made to patient of mine. He works for [employer], as a Engineer, manufacturing systems. He is a former smoker but quit in 1965 after 7 years. He does not drink alcohol.   He is very busy with work, and does not get routine exercise. He plans to work maybe one more year and then will retire. 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   Family History  Problem Relation Age of Onset  . Heart disease Father 14  . Cancer Father     Colon  . Heart disease Brother   . Cancer Brother     Colon  . Stroke Mother 49  . Hypertension Mother 60  . Hypertension Sister 30  . Hyperlipidemia Sister   . Hypertension Brother 73    Wt Readings from Last 3 Encounters:  03/26/15 88.905 kg (196 lb)  02/21/15 87.3 kg (192 lb 7.4 oz)  02/17/15 87.907 kg (193 lb 12.8 oz)    PHYSICAL EXAM BP 138/80 mmHg  Pulse 74  Ht 5\' 8"  (1.727 m)  Wt 88.905 kg (196 lb)  BMI 29.81 kg/m2 General appearance: alert, cooperative, appears stated age, no distress and borderline obese; actually present and affect HEENT: [place]/AT, EOMI, MMM, anicteric sclera Neck: no adenopathy, no carotid bruit and no JVD Lungs: clear to auscultation bilaterally, normal percussion bilaterally and non-labored Heart: regular rate and rhythm, S1 & S2 normal, + S4, soft 1/6 c-d SEM @ RUSB-> carotids; No click, rub; nondisplaced PMI Abdomen: soft, non-tender; bowel sounds normal; no masses, no organomegaly;  mildly protuberant abdomen Extremities: extremities normal, atraumatic, no cyanosis, and edema 1-2+ Right ankle, 1+ left ankle Pulses: 2+ and symmetric;  Skin: mobility and turgor normal  Neurologic: Mental  status: Alert, oriented, thought content appropriate Cranial nerves: normal (II-XII grossly intact)   Adult ECG Report  Rate: 74 ;  Rhythm: normal sinus rhythm and Left axis deviation (-40), borderline left atrial abnormality.  Narrative Interpretation: Stable relatively normal EKG. Normal intervals, durations and voltage.   Other studies Reviewed: Recent Labs:   Lab Results  Component Value Date   CHOL 156 08/05/2014   HDL 48 08/05/2014   LDLCALC 89 08/05/2014   TRIG 94 08/05/2014   CHOLHDL 3.3 08/05/2014     Chemistry      Component Value Date/Time   NA 137 02/21/2015 0612   K 4.3 02/21/2015 0612   CL 101 02/21/2015 0612   CO2 27 02/21/2015 0612   BUN 11 02/21/2015 0612   CREATININE 0.95 02/21/2015 0612   CREATININE 0.80 02/17/2015 1202      Component Value Date/Time   CALCIUM 9.1 02/21/2015 0612   ALKPHOS 65 08/05/2014 0844   AST 26 08/05/2014 0844   ALT 30 08/05/2014 0844   BILITOT 0.8 08/05/2014 0844      ASSESSMENT / PLAN: Problem List Items Addressed This Visit    Angina, class III    Pretty much resolved with PCI of the distal RCA-RPAV.       Bilateral lower extremity edema (Chronic)    I started him on low-dose Lasix at home, he did not give the prescription refilled. He was noticing also a little of swelling in his abdomen. He's not sure if this was more related to some bloating or also edema. He's not having PND orthopnea. Echocardiogram look relatively normal. LVEDP was relatively normal.  He will monitor his symptoms and let us know. Low threshold to reinitiate plan for low-dose when necessary Lasix.      CAD (coronary artery disease) of artery bypass graft -atretic LIMA- circumflex, occluded SVG-RCA and SVG-diagonal. (Chronic)    Radial  access worked quite well since his LAD graft is a RIMA as opposed to LIMA. His other vein grafts are occluded as is his LIMA-Cx.  For future catheterization requirements, he can easily be a radial candidate.      Relevant Orders   EKG 12-Lead (Completed)   CAD S/P  PCI of Cx-OM w/ 2 Resolute DES 3.0 x 15 & 3.0 x 18 (3.72mm); PCI-pRCA: Promus Element DES 3.0 x 38 (3.73mm); dRCA-rPAV: Promus Premier DES 2.75x28 (3 mm) - Primary (Chronic)    Switched from Plavix to Effient with aspirin. No bleeding issues. On standing dose of carvedilol taken as noted in medication instructions due to borderline intolerance of carvedilol.. On Vytorin. He did not tolerate ACE inhibitor/ARB or calcium channel blocker in the past.      Relevant Orders   EKG 12-Lead (Completed)   EKG 12-Lead   DOE (dyspnea on exertion)    Notably improved following PCI. This is clearly his most significant anginal equivalent.      Hyperlipidemia with target LDL less than 70 (Chronic)    On Vytorin. His PCP been monitoring his lipids. Last check he was not quite at goal, but close. For now, since he is feeling well, I will not press the issue of more aggressive treatment.      PVC's (premature ventricular contractions) (Chronic)    Medical control post PCI on current beta blocker dose.         Current medicines are reviewed at length with the patient today. (+/- concerns) perhaps needing when necessary Lasix. He is noticing some mild edema.  CALL  BACK TO OFFICE WITH IN SYMPTOMS  CONTINUE WITH CURRENT MEDICATIONS   Your physician wants you to follow-up in  [address]  [address]. [place].    Leonie Man, M.D., M.S. Interventional Cardiologist   Pager # [phone]

## 2015-03-28 ENCOUNTER — Encounter: Payer: Self-pay | Admitting: Cardiology

## 2015-03-28 NOTE — Assessment & Plan Note (Signed)
Notably improved following PCI. This is clearly his most significant anginal equivalent.

## 2015-03-28 NOTE — Assessment & Plan Note (Signed)
Medical control post PCI on current beta blocker dose.

## 2015-03-28 NOTE — Assessment & Plan Note (Signed)
Radial access worked quite well since his LAD graft is a RIMA as opposed to LIMA. His other vein grafts are occluded as is his LIMA-Cx.  For future catheterization requirements, he can easily be a radial candidate.

## 2015-03-28 NOTE — Assessment & Plan Note (Signed)
I started him on low-dose Lasix at home, he did not give the prescription refilled. He was noticing also a little of swelling in his abdomen. He's not sure if this was more related to some bloating or also edema. He's not having PND orthopnea. Echocardiogram look relatively normal. LVEDP was relatively normal.  He will monitor his symptoms and let us know. Low threshold to reinitiate plan for low-dose when necessary Lasix.

## 2015-03-28 NOTE — Assessment & Plan Note (Signed)
On Vytorin. His PCP been monitoring his lipids. Last check he was not quite at goal, but close. For now, since he is feeling well, I will not press the issue of more aggressive treatment.

## 2015-03-28 NOTE — Assessment & Plan Note (Signed)
Switched from Plavix to Effient with aspirin. No bleeding issues. On standing dose of carvedilol taken as noted in medication instructions due to borderline intolerance of carvedilol.. On Vytorin. He did not tolerate ACE inhibitor/ARB or calcium channel blocker in the past.

## 2015-03-28 NOTE — Assessment & Plan Note (Signed)
Pretty much resolved with PCI of the distal RCA-RPAV.

## 2015-03-30 ENCOUNTER — Telehealth: Payer: Self-pay | Admitting: Cardiology

## 2015-03-30 NOTE — Telephone Encounter (Signed)
Spoke with patient He states he spoke with billing concerning recent catheterization From outpatient to inpatient- Per billing personnel, Dr Ellyn Hack has to change admission. Patient aware will discuss with Dr Ellyn Hack.

## 2015-03-30 NOTE — Telephone Encounter (Signed)
Please call,concerning the discussion he had with you last week.It was something about his hospital bill,he said he did what you told him.

## 2015-03-31 NOTE — Telephone Encounter (Signed)
Spoke to patient unable to switch from outpatient to inpatient- the procedure was an outpatient. Patient verbalized understanding.

## 2015-03-31 NOTE — Telephone Encounter (Signed)
He should have been maintained as OP/Obs - not Inpatient. Will forward to [employer] (coding/billing).  [place]

## 2015-04-01 ENCOUNTER — Telehealth: Payer: Self-pay | Admitting: Cardiology

## 2015-04-01 NOTE — Telephone Encounter (Signed)
°  1. Which medications need to be refilled? Nitrostat   2. Which pharmacy is medication to be sent to?[facility] in [place]   3. Do they need a 30 day or 90 day supply? 30  4. Would they like a call back once the medication has been sent to the pharmacy? Yes

## 2015-04-02 ENCOUNTER — Telehealth: Payer: Self-pay | Admitting: *Deleted

## 2015-04-02 NOTE — Telephone Encounter (Signed)
Forwarded to Trixie Dredge, RN

## 2015-04-02 NOTE — Telephone Encounter (Signed)
Patient requesting refills on NTG but not listed on the med list.  Forwarded to [name] to get an order from Dr. Ellyn Hack.

## 2015-04-03 MED ORDER — NITROGLYCERIN 0.4 MG SL SUBL: 0.4000 mg | SUBLINGUAL_TABLET | SUBLINGUAL | Status: DC | PRN

## 2015-04-03 NOTE — Addendum Note (Signed)
Addended by: Raiford Simmonds on: 04/03/2015 06:42 PM   Modules accepted: Orders

## 2015-04-03 NOTE — Telephone Encounter (Signed)
OKAY PER dr harding to order NTG.   MEDICATION  E- SENT TO PHARMACY.

## 2015-04-06 ENCOUNTER — Telehealth: Payer: Self-pay | Admitting: Cardiology

## 2015-04-06 NOTE — Telephone Encounter (Signed)
SPOKE TO PATIENT PATIENT REQUEST ECHO RESULTS FOR HIM AND HIS WIFE CATHERINE.  FAX NUMBER VERIFIED.  BOTH ROUTED TO PATIENT.

## 2015-04-06 NOTE — Telephone Encounter (Signed)
Verified information received to patient-done

## 2015-04-06 NOTE — Telephone Encounter (Signed)
Pt called in wanting to get his results from his echo the was done on 6/15. He is requesting that those results be faxed to his personal fax machine at [phone]  Thanks

## 2015-04-06 NOTE — Telephone Encounter (Signed)
Prescription refilled on 04/03/15

## 2015-04-06 NOTE — Telephone Encounter (Signed)
Can this encounter be closed?

## 2015-05-19 ENCOUNTER — Other Ambulatory Visit: Payer: Self-pay | Admitting: *Deleted

## 2015-05-19 MED ORDER — PRASUGREL HCL 10 MG PO TABS: ORAL_TABLET | ORAL | Status: DC

## 2015-09-10 ENCOUNTER — Telehealth: Payer: Self-pay | Admitting: Cardiology

## 2015-09-10 ENCOUNTER — Ambulatory Visit: Payer: Medicare Other | Admitting: Cardiology

## 2015-09-10 NOTE — Telephone Encounter (Signed)
Spoke to patient  He states he has been bloody in urine   Notice more often ,occurs throughout the day no particular time no back pain, no blood in stools. Will defer Dr Ellyn Hack.

## 2015-09-10 NOTE — Telephone Encounter (Signed)
Pt called in stating that he dropped off some labs to [place] today but he had a couple of questions that he wanted to ask so he would like for [name] to call him back .  Thanks

## 2015-09-11 NOTE — Telephone Encounter (Signed)
Per Dr Ellyn Hack ,MAY STOP ASPIRIN  NEEDS TO SEE UROLOGIST AND IF PATIENT HAS LOTS OF BLOOD IN URINE -CAN HOLD EFFIENT  FOR 3-4 DAYS THEN RESTART. LABS REVIEWED-OKAY  RN SPOKE TO PATIENT, INFORMATION GIVEN ,VERBALIZED UNDERSTANDING.

## 2015-09-23 ENCOUNTER — Ambulatory Visit (INDEPENDENT_AMBULATORY_CARE_PROVIDER_SITE_OTHER): Payer: 59 | Admitting: Cardiology

## 2015-09-23 ENCOUNTER — Encounter: Payer: Self-pay | Admitting: Cardiology

## 2015-09-23 VITALS — BP 136/78 | HR 67 | Ht 67.0 in | Wt 192.0 lb

## 2015-09-23 DIAGNOSIS — I2510 Atherosclerotic heart disease of native coronary artery without angina pectoris: Secondary | ICD-10-CM | POA: Diagnosis not present

## 2015-09-23 DIAGNOSIS — R600 Localized edema: Secondary | ICD-10-CM

## 2015-09-23 DIAGNOSIS — Z9861 Coronary angioplasty status: Secondary | ICD-10-CM

## 2015-09-23 DIAGNOSIS — E785 Hyperlipidemia, unspecified: Secondary | ICD-10-CM

## 2015-09-23 DIAGNOSIS — I493 Ventricular premature depolarization: Secondary | ICD-10-CM | POA: Diagnosis not present

## 2015-09-23 DIAGNOSIS — I25810 Atherosclerosis of coronary artery bypass graft(s) without angina pectoris: Secondary | ICD-10-CM

## 2015-09-23 NOTE — Patient Instructions (Signed)
NO CHANGE IN CURRENT MEDICATIONS OR TREATMENT   Your physician wants you to follow-up in [name].  You will receive a reminder letter in the mail two months in advance. If you don't receive a letter, please call our office to schedule the follow-up appointment.   If you need a refill on your cardiac medications before your next appointment, please call your pharmacy.

## 2015-09-25 ENCOUNTER — Encounter: Payer: Self-pay | Admitting: Cardiology

## 2015-09-25 NOTE — Assessment & Plan Note (Signed)
Well-controlled with low-dose beta blocker. Possibly also with revascularization

## 2015-09-25 NOTE — Assessment & Plan Note (Signed)
Basically now he has a RIMA to LAD graft and all the grafts are occluded. We can do future catheterizations to the right radial if necessary. Basically now is living on his RIMA graft and stents to the RCA system and circumflex.

## 2015-09-25 NOTE — Progress Notes (Signed)
PCP: Wende Neighbors, MD  Clinic Note: Chief Complaint  Patient presents with  . Coronary Artery Disease    HPI: Needham A Sobieraj is a 75 y.o. male with a PMH below who presents today for six-month follow-up of CAD with history of CABG and PCI. Most recent PCI was in June 2016 where he underwent PCI of the RPAV with a DES stent.  Destin A Counsell was last seen on 03/26/2015. This is follow-up after his cardiac catheterization and PCI. Prior to that he had been seen in June for fluid retention, exertional dyspnea and edema as well as palpitations. This was all similar to his prior CAD related symptoms. He had profound exertional dyspnea. Cardiac catheterization revealed dRCA 50% & progression of CAD in the dRCA-RPAV  -- treated with Promus Premier DES. -- At that visit, he was doing well with no complaints. It took a little while after his To feel better, but once S saw him back he was feeling wonderful and no further symptoms.  Recent Hospitalizations: None since catheterization  Studies Reviewed: No new studies.  Interval History: Romie Minus presents today again in great spirits. He has no complaints of any recurrent exertional dyspnea. He is still working and active. He says he is not as active as he should be, because he is not yet exercising, but that is because he "just has not gotten to it". He still has occasional rare PVCs and palpitations but nothing to the extent that he had previously. He asked about when he can stop Effient, he had an episode of hematuria a few weeks ago they got better after stopping aspirin. He stop Effient for 2 days and then went back on it. I recommend that he just simply stop aspirin at that time. He also was switched from Vytorin to atorvastatin based on his lipids noted in December. He is due to have follow-up labs.  Another lesion was noted in his history was that he had low testosterone levels. In the past with testosterone supplementation, he had good symptomatic  improvement. He is currently starting back on testosterone replacement. We talked about how that may affect his prostate, also talked about concerns for possible cardiac ramifications. Basically saying he should shoot for low normal to symptomatic relief.  No chest pain or shortness of breath with rest or exertion. No PND, orthopnea or edema. No palpitations, lightheadedness, dizziness, weakness or syncope/near syncope. No TIA/amaurosis fugax symptoms. No melena, hematochezia, or epstaxis. No claudication.  ROS: A comprehensive was performed. Review of Systems  Constitutional: Positive for malaise/fatigue (See history of present illness).  Respiratory: Negative for cough and shortness of breath.   Cardiovascular:       Per HPI  Gastrointestinal: Positive for heartburn (occasional).  Genitourinary: Positive for hematuria (See history of present illness).  Musculoskeletal: Positive for back pain. Negative for joint pain and falls.  Neurological: Negative for dizziness, seizures, loss of consciousness and headaches.  Endo/Heme/Allergies: Does not bruise/bleed easily.  Psychiatric/Behavioral: Negative for depression and memory loss. The patient does not have insomnia.   All other systems reviewed and are negative.   Past Medical History  Diagnosis Date  . Allergic rhinitis   . Asthma   . Family hx of colon cancer   . Gout   . GERD (gastroesophageal reflux disease)   . Hemorrhoids   . Diverticulosis   . Erectile dysfunction   . Asthmatic bronchitis   . Polycythemia   . CAS (cerebral atherosclerosis)     CAROTID DOPPLER,  08/25/2011 - mildly abnormal  . Syncope     2D ECHO, 07/13/2009 - EF >55%, normal  . CAD (coronary artery disease), native coronary artery 07/2004    FATIGUE - PVCs -- CATH -> MV CAD --> CABG X 4  . 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 CAD of autologous vein bypass graft without angina 04/2011    Frequent PVCs & fatigue -> MYOVIEW w/  inferior ischemic with concern for MV distribution --> CATH: only RIMA-LAD patent (LIMA atretic & SVGs occluded) --> Staged PCI  . CAD S/P percutaneous coronary angioplasty 04/2011; 02/20/2015    a) PCI- Cx-OM W/ 2 overlapping Resolute DES 3x76mm & 3x30mm stents (3.79mm),  PCI to RCA -  Promus DES 3x55mm (3.53mm);;; b) 6/'16: PCI of dRCA-rPAV Promus Premier DES 2.75X28 (3.0 mm)  . Symptomatic PVCs   . Hyperlipidemia with target LDL less than 70   . Essential hypertension   . Kidney stones   . Myocardial infarction ([place]) 07/2009  . Arthritis     "entire body" (02/19/2015)  . Chronic back pain     Past Surgical History  Procedure Laterality Date  . Cardiac catheterization  07/15/2004    CABG recommended; continue medical therapy  . Cardiac catheterization  04/26/2011    Patent RIMA-LAD, atretic LIMA-circumflex (previous) 100 and occluded SVG to diagonal and SVG to PDA. LAD 80-90% eccentric stenosis at SP1 that has 70% stenosis. Circumflex: Large OM and another distal branch with 90-95% stenosis followed by lesion in the OM branch. RCA dominant 6% lesion proximally and several old lesions. PDA occluded 70-80% annular lesion in the proximal RCA. 3 of 4 grafts occlu  . Coronary angioplasty with stent placement  04/28/2011; 02/20/2015    PCI-Cx-OM 2 overlapping Resolute DES 3 mm x23mm and 3x23mm --> 3.63mm; PCI to RCA - Promus Element DES 3x3mm --> 3.18mm;; b) dRCA-rPAV: Promus Premier DES 2.39mm X 28 mm (3.0 mm)   . Cardiac catheterization N/A 02/20/2015    Procedure: Left Heart Cath and Coronary Angiography;  Surgeon: Leonie Man, MD;  Location: [facility];  Service: Cardiovascular;  Laterality: N/A;  . Coronary artery bypass graft  07/23/2004    LIMA-Cx RIMA-LAD, SVG-diagonal, SVG-PDA  . Cataract extraction w/ intraocular lens  implant, bilateral Bilateral ~ 2006/2007  . Transurethral resection of prostate  ~ 2002/2003  . Transthoracic echocardiogram  02/18/2015     Mild concentric LVH,  EF 55-60%, no RWMA, Gr 1 DD, aortic sclerosis without stenosis   Prior to Admission medications   Medication Sig Start Date End Date Taking? Authorizing Provider  albuterol (PROVENTIL HFA;VENTOLIN HFA) 108 (90 BASE) MCG/ACT inhaler Inhale 1 puff into the lungs every 6 (six) hours as needed for wheezing or shortness of breath.   Yes Historical Provider, MD  ALPRAZolam Duanne Moron) 0.5 MG tablet Take 0.5 mg by mouth daily as needed for anxiety. As needed 05/19/11  Yes Historical Provider, MD  aspirin EC 81 MG tablet Take 1 tablet (81 mg total) by mouth daily. 02/21/15  Yes Brittainy Erie Noe, PA-C  atorvastatin (LIPITOR) 10 MG tablet Take 10 mg by mouth daily.   Yes Historical Provider, MD  carvedilol (COREG) 3.125 MG tablet TAKE 1/4 TABLET IN MORNING AND 1/2 TABLET IN THE EVENING 08/14/14  Yes Troy Sine, MD  chlorzoxazone (PARAFON) 500 MG tablet Take 500 mg by mouth 2 (two) times daily.  06/02/14  Yes Historical Provider, MD  Cholecalciferol (VITAMIN D) 2000 UNITS  CAPS Take 1 capsule by mouth daily.   Yes Historical Provider, MD  mometasone (ELOCON) 0.1 % cream Apply to affected area three times a day 01/22/15  Yes Historical Provider, MD  nitroGLYCERIN (NITROSTAT) 0.4 MG SL tablet Place 1 tablet (0.4 mg total) under the tongue every 5 (five) minutes as needed for chest pain. 04/03/15  Yes Leonie Man, MD  oxyCODONE-acetaminophen (PERCOCET) 10-325 MG per tablet Take 1 tablet by mouth as needed. 02/16/15  Yes Historical Provider, MD  prasugrel (EFFIENT) 10 MG TABS tablet Take 1 tablet (10 mg total) by mouth daily. 02/21/15  Yes Brittainy Erie Noe, PA-C   Allergies  Allergen Reactions  . Meperidine Hcl   . Penicillins      Social History   Social History  . Marital Status: Married    Spouse Name: N/A  . Number of Children: N/A  . Years of Education: N/A   Occupational History  . MANAGER [employer]   Social History Main Topics  . Smoking status: Former Smoker -- 1.00 packs/day for 7 years     Types: Cigarettes    Quit date: 09/06/1963  . Smokeless tobacco: Never Used  . Alcohol Use: No  . Drug Use: No  . Sexual Activity: Not Asked     Comment: 02/20/2015 refuses to answer sexually active   Other Topics Concern  . None   Social History Narrative   He is made to patient of mine. He works for [employer], as a Engineer, manufacturing systems. He is a former smoker but quit in 1965 after 7 years. He does not drink alcohol.   He is very busy with work, and does not get routine exercise. He plans to work maybe one more year and then will retire. 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    Family History  Problem Relation Age of Onset  . Heart disease Father 87  . Cancer Father     Colon  . Heart disease Brother   . Cancer Brother     Colon  . Stroke Mother 81  . Hypertension Mother 51  . Hypertension Sister 18  . Hyperlipidemia Sister   . Hypertension Brother 62    Wt Readings from Last 3 Encounters:  09/23/15 192 lb (87.091 kg)  03/26/15 196 lb (88.905 kg)  02/21/15 192 lb 7.4 oz (87.3 kg)    PHYSICAL EXAM BP 136/78 mmHg  Pulse 67  Ht 5\' 7"  (1.702 m)  Wt 192 lb (87.091 kg)  BMI 30.06 kg/m2 General appearance: alert, cooperative, appears stated age, no distress and borderline obese; actually pleasant mood and affect HEENT: [place]/AT, EOMI, MMM, anicteric sclera Neck: no adenopathy, no carotid bruit and no JVD Lungs: clear to auscultation bilaterally, normal percussion bilaterally and non-labored Heart: RRR, S1 & S2 normal, + S4, soft 1/6 c-d SEM @ RUSB-> carotids; No click, rub; nondisplaced PMI Abdomen: soft, non-tender; bowel sounds normal; no masses, no organomegaly; mildly protuberant abdomen Extremities: extremities normal, atraumatic, no cyanosis, and edema 1-2+ Right ankle, 1+ left ankle Pulses: 2+ and symmetric;  Skin: mobility and turgor normal  Neurologic: Mental status: Alert,  oriented, thought content appropriate Cranial nerves: normal (II-XII grossly intact)    Adult ECG Report  Rate: 67 ;  Rhythm: normal sinus rhythm and normal axis, intervals and durations (borderline left axis deviation, -26);   Narrative Interpretation: Normal, stable EKG   Other studies Reviewed: Additional studies/  records that were reviewed today include:  Recent Labs:  Checked by PCP 08/14/15  Na+ 139, K+ 5.1, Cl- 102, HCO3- 30 , BUN 13, Cr 0.9, Glu 104, Ca2+ 9.5; AST 18, ALT 17, AlkP 72, Alb 415, TP 6.6, T Bili 0.7  CBC: W 8.2, H/H 15.4/44.7 Plt 253  TC 216, TG 118, HDL 50, LDL 142     ASSESSMENT / PLAN: Problem List Items Addressed This Visit    PVC's (premature ventricular contractions) (Chronic)    Well-controlled with low-dose beta blocker. Possibly also with revascularization      Relevant Medications   atorvastatin (LIPITOR) 10 MG tablet   Hyperlipidemia with target LDL less than 70 (Chronic)    Recent labs reviewed. More aggressive regimen was started by switching from Vytorin to higher dose atorvastatin. He sees be tolerating relatively well. Due for follow-up labs in the next few months.       Relevant Medications   atorvastatin (LIPITOR) 10 MG tablet   Other Relevant Orders   EKG 12-Lead (Completed)   CAD S/P  PCI of Cx-OM w/ 2 Resolute DES 3.0 x 15 & 3.0 x 18 (3.79mm); PCI-pRCA: Promus Element DES 3.0 x 38 (3.26mm); dRCA-rPAV: Promus Premier DES 2.75x28 (3 mm) - Primary (Chronic)    Excellent result from last PCI. No recurrent anginal equivalent symptoms. He never really had chest pain as angina, was mostly exertional dyspnea and increased PVCs. Doing well. I think we can probably stop the aspirin while he is on Effient. After one year of Effient, I will convert him back to his Plavix which he was taking every third day with intermittent aspirin dosing on the days without Plavix. He is on a very small dose of carvedilol, as best he can tolerate. Did not tolerate  ACE inhibitor or ARB or even calcium channel blockers in the past due to dizziness and other symptoms. Converted from Vytorin to atorvastatin based on last lipids.      Relevant Medications   atorvastatin (LIPITOR) 10 MG tablet   Other Relevant Orders   EKG 12-Lead (Completed)   CAD (coronary artery disease) of artery bypass graft -atretic LIMA- circumflex, occluded SVG-RCA and SVG-diagonal. (Chronic)    Basically now he has a RIMA to LAD graft and all the grafts are occluded. We can do future catheterizations to the right radial if necessary. Basically now is living on his RIMA graft and stents to the RCA system and circumflex.      Relevant Medications   atorvastatin (LIPITOR) 10 MG tablet   Bilateral lower extremity edema (Chronic)    He never did get the Lasix filled. I think once we revascularized his right coronary system, his likely diastolic heart failure symptoms improved. Continue to monitor. If necessary we can again try to prescribe when necessary Lasix.  Currently not noticing any edema. We talked about potentially been using support stockings      Relevant Orders   EKG 12-Lead (Completed)      Current medicines are reviewed at length with the patient today. (+/- concerns) none The following changes have been made: none  NO CHANGE IN CURRENT MEDICATIONS OR TREATMENT   Your physician wants you to follow-up in [place].   Studies Ordered:   Orders Placed This Encounter  Procedures  . EKG 12-Lead      Leonie Man, M.D., M.S. Interventional Cardiologist   Pager # [phone] Phone # [phone] [address] [place], [place] [address]

## 2015-09-25 NOTE — Assessment & Plan Note (Signed)
Excellent result from last PCI. No recurrent anginal equivalent symptoms. He never really had chest pain as angina, was mostly exertional dyspnea and increased PVCs. Doing well. I think we can probably stop the aspirin while he is on Effient. After one year of Effient, I will convert him back to his Plavix which he was taking every third day with intermittent aspirin dosing on the days without Plavix. He is on a very small dose of carvedilol, as best he can tolerate. Did not tolerate ACE inhibitor or ARB or even calcium channel blockers in the past due to dizziness and other symptoms. Converted from Vytorin to atorvastatin based on last lipids.

## 2015-09-25 NOTE — Assessment & Plan Note (Signed)
Recent labs reviewed. More aggressive regimen was started by switching from Vytorin to higher dose atorvastatin. He sees be tolerating relatively well. Due for follow-up labs in the next few months.

## 2015-09-25 NOTE — Assessment & Plan Note (Signed)
He never did get the Lasix filled. I think once we revascularized his right coronary system, his likely diastolic heart failure symptoms improved. Continue to monitor. If necessary we can again try to prescribe when necessary Lasix.  Currently not noticing any edema. We talked about potentially been using support stockings

## 2015-12-05 ENCOUNTER — Other Ambulatory Visit: Payer: Self-pay | Admitting: Cardiovascular Disease

## 2015-12-07 NOTE — Telephone Encounter (Signed)
Rx(s) sent to pharmacy electronically.  

## 2016-01-22 ENCOUNTER — Other Ambulatory Visit (HOSPITAL_COMMUNITY): Payer: Self-pay | Admitting: Internal Medicine

## 2016-01-22 ENCOUNTER — Ambulatory Visit (HOSPITAL_COMMUNITY)
Admission: RE | Admit: 2016-01-22 | Discharge: 2016-01-22 | Disposition: A | Discharge status: Home / Self Care | Payer: 59 | Source: Ambulatory Visit | Attending: Internal Medicine | Admitting: Internal Medicine

## 2016-01-22 DIAGNOSIS — M7731 Calcaneal spur, right foot: Secondary | ICD-10-CM | POA: Diagnosis not present

## 2016-01-22 DIAGNOSIS — M19071 Primary osteoarthritis, right ankle and foot: Secondary | ICD-10-CM | POA: Insufficient documentation

## 2016-01-22 DIAGNOSIS — M79671 Pain in right foot: Secondary | ICD-10-CM | POA: Diagnosis not present

## 2016-01-22 DIAGNOSIS — R52 Pain, unspecified: Secondary | ICD-10-CM

## 2016-02-04 ENCOUNTER — Telehealth: Payer: Self-pay | Admitting: Cardiology

## 2016-02-04 DIAGNOSIS — E785 Hyperlipidemia, unspecified: Secondary | ICD-10-CM

## 2016-02-04 DIAGNOSIS — I25810 Atherosclerosis of coronary artery bypass graft(s) without angina pectoris: Secondary | ICD-10-CM

## 2016-02-04 DIAGNOSIS — Z79899 Other long term (current) drug therapy: Secondary | ICD-10-CM

## 2016-02-04 DIAGNOSIS — Z9861 Coronary angioplasty status: Secondary | ICD-10-CM

## 2016-02-04 DIAGNOSIS — I2510 Atherosclerotic heart disease of native coronary artery without angina pectoris: Secondary | ICD-10-CM

## 2016-02-04 NOTE — Telephone Encounter (Signed)
We will place order.    [name], MD

## 2016-02-04 NOTE — Telephone Encounter (Signed)
Mr. [name] is asking that a lab order be mailed to him before his appt on 03/24/16 w/ Dr.Harding . Thanks

## 2016-02-04 NOTE — Telephone Encounter (Signed)
Pt requesting pre appt labwork, none ordered - had labwork 1 year ago. Routed for recommendation.

## 2016-02-05 NOTE — Telephone Encounter (Signed)
LEFT MESSAGE--LAB SLIP MAILED

## 2016-02-14 ENCOUNTER — Other Ambulatory Visit: Payer: Self-pay | Admitting: Cardiology

## 2016-02-15 NOTE — Telephone Encounter (Signed)
Rx request sent to pharmacy.  

## 2016-03-21 ENCOUNTER — Encounter: Payer: Self-pay | Admitting: Cardiology

## 2016-03-24 ENCOUNTER — Encounter: Payer: Self-pay | Admitting: Cardiology

## 2016-03-24 ENCOUNTER — Ambulatory Visit (INDEPENDENT_AMBULATORY_CARE_PROVIDER_SITE_OTHER): Payer: 59 | Admitting: Cardiology

## 2016-03-24 VITALS — BP 144/82 | HR 68 | Ht 67.0 in | Wt 189.2 lb

## 2016-03-24 DIAGNOSIS — E785 Hyperlipidemia, unspecified: Secondary | ICD-10-CM | POA: Diagnosis not present

## 2016-03-24 DIAGNOSIS — I493 Ventricular premature depolarization: Secondary | ICD-10-CM

## 2016-03-24 DIAGNOSIS — I25810 Atherosclerosis of coronary artery bypass graft(s) without angina pectoris: Secondary | ICD-10-CM | POA: Diagnosis not present

## 2016-03-24 DIAGNOSIS — I2510 Atherosclerotic heart disease of native coronary artery without angina pectoris: Secondary | ICD-10-CM | POA: Diagnosis not present

## 2016-03-24 DIAGNOSIS — Z9861 Coronary angioplasty status: Secondary | ICD-10-CM

## 2016-03-24 MED ORDER — EZETIMIBE-SIMVASTATIN 10-40 MG PO TABS: 0.5000 | ORAL_TABLET | Freq: Every day | ORAL | Status: DC

## 2016-03-24 MED ORDER — ALPRAZOLAM 0.5 MG PO TABS: 0.5000 mg | ORAL_TABLET | Freq: Every day | ORAL | Status: DC | PRN

## 2016-03-24 NOTE — Progress Notes (Signed)
PCP: [name] Neighbors, MD  Clinic Note: Chief Complaint  Patient presents with  . Follow-up    6 months  . Coronary Artery Disease    HPI: [name] is a 75 y.o. male with a PMH below who presents today for Six-month follow-up of CAD with prior history of CABG followed by PCI. Most recent PCI was in June 2016 where he had PCI of the posterior AV groove branch with a DES stent. Since then he has done very well with no active anginal symptoms..  [name] was last seen on January 2017. He was doing well. In great spirits. No complaints at that time.  Recent Hospitalizations: None  Studies Reviewed: None  Interval History:  [name] presents today again without any major complaints from a cardiac standpoint. He still has some of his mild basal dyspnea with exertion, but is actually working now on trying to lose weight and has been exercising more than that he had been. He says that his dyspnea has improved. He is also been working on his dietary management. He still working full-time which limits his timeline for doing activity. He didn't seem to mention much but his energy levels. He says that he's been using the stairs when he can exercise.Marland Kitchen He is to go forward to getting started on a new exercise machine that he bought that allows him to do stairstepping with arm movements.  Because of his lipids not being is controlled, he was switched to atorvastatin for short time, but had significant symptoms as a result, and therefore went back to Vytorin.  No chest pain or shortness of breath with rest or exertion. No PND, orthopnea or edema. No palpitations, lightheadedness, dizziness, weakness or syncope/near syncope. No TIA/amaurosis fugax symptoms. No melena, hematochezia, hematuria, or epstaxis. No claudication.  ROS: A comprehensive was performed. Review of Systems  Constitutional: Positive for weight loss. Negative for malaise/fatigue.  Respiratory: Positive for shortness of breath (Mild  baseline).   Cardiovascular: Negative.        Per history of present illness  Gastrointestinal: Positive for nausea (He notes having nausea that occurs after lying down or being on the floor for short period time and then sitting up.).  Musculoskeletal: Negative for myalgias, joint pain and falls.  Neurological: Negative for dizziness, tremors, focal weakness and headaches.  Endo/Heme/Allergies: Positive for environmental allergies.  Psychiatric/Behavioral: Negative for memory loss. The patient is nervous/anxious (Has rare occasions. He needs refill of his Xanax, because he finally finished the bottle that was from over a year ago, and the refill was given is from a  outdated  Prescription). The patient does not have insomnia.     Past Medical History  Diagnosis Date  . Allergic rhinitis   . Asthma   . Family hx of colon cancer   . Gout   . GERD (gastroesophageal reflux disease)   . Hemorrhoids   . Diverticulosis   . Erectile dysfunction   . Asthmatic bronchitis   . Polycythemia   . CAS (cerebral atherosclerosis)     CAROTID DOPPLER, 08/25/2011 - mildly abnormal  . Syncope     2D ECHO, 07/13/2009 - EF >55%, normal  . CAD (coronary artery disease), native coronary artery 07/2004    FATIGUE - PVCs -- CATH -> MV CAD --> CABG X 4  . 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 CAD of autologous vein bypass graft without angina 04/2011    Frequent PVCs &  fatigue -> MYOVIEW w/ inferior ischemic with concern for MV distribution --> CATH: only RIMA-LAD patent (LIMA atretic & SVGs occluded) --> Staged PCI  . CAD S/P percutaneous coronary angioplasty 04/2011; 02/20/2015    a) PCI- Cx-OM W/ 2 overlapping Resolute DES 3x21mm & 3x73mm stents (3.40mm),  PCI to RCA -  Promus DES 3x62mm (3.58mm);;; b) 6/'16: PCI of dRCA-rPAV Promus Premier DES 2.75X28 (3.0 mm)  . Symptomatic PVCs   . Hyperlipidemia with target LDL less than 70   . Essential hypertension   . Kidney stones   .  Myocardial infarction ([place]) 07/2009  . Arthritis     "entire body" (02/19/2015)  . Chronic back pain     Past Surgical History  Procedure Laterality Date  . Cardiac catheterization  07/15/2004    CABG recommended; continue medical therapy  . Cardiac catheterization  04/26/2011    Patent RIMA-LAD, atretic LIMA-circumflex (previous) 100 and occluded SVG to diagonal and SVG to PDA. LAD 80-90% eccentric stenosis at SP1 that has 70% stenosis. Circumflex: Large OM and another distal branch with 90-95% stenosis followed by lesion in the OM branch. RCA dominant 6% lesion proximally and several old lesions. PDA occluded 70-80% annular lesion in the proximal RCA. 3 of 4 grafts occlu  . Coronary angioplasty with stent placement  04/28/2011; 02/20/2015    PCI-Cx-OM 2 overlapping Resolute DES 3 mm x40mm and 3x97mm --> 3.60mm; PCI to RCA - Promus Element DES 3x22mm --> 3.14mm;; b) dRCA-rPAV: Promus Premier DES 2.42mm X 28 mm (3.0 mm)   . Cardiac catheterization N/A 02/20/2015    Procedure: Left Heart Cath and Coronary Angiography;  Surgeon: Leonie Man, MD;  Location: [facility];  Service: Cardiovascular;  Laterality: N/A;  . Coronary artery bypass graft  07/23/2004    LIMA-Cx RIMA-LAD, SVG-diagonal, SVG-PDA  . Cataract extraction w/ intraocular lens  implant, bilateral Bilateral ~ 2006/2007  . Transurethral resection of prostate  ~ 2002/2003  . Transthoracic echocardiogram  02/18/2015     Mild concentric LVH, EF 55-60%, no RWMA, Gr 1 DD, aortic sclerosis without stenosis   Prior to Admission medications   Medication Sig Start Date End Date Taking? Authorizing Provider  albuterol (PROVENTIL HFA;VENTOLIN HFA) 108 (90 BASE) MCG/ACT inhaler Inhale 1 puff into the lungs every 6 (six) hours as needed for wheezing or shortness of breath.   Yes Historical Provider, MD  ALPRAZolam Duanne Moron) 0.5 MG tablet Take 1 tablet (0.5 mg total) by mouth daily as needed for anxiety. As needed 03/24/16  Yes Leonie Man, MD  aspirin EC 81 MG tablet Take 1 tablet (81 mg total) by mouth daily. 02/21/15  Yes Brittainy Erie Noe, PA-C  atorvastatin (LIPITOR) 10 MG tablet Take 10 mg by mouth daily.   Yes Historical Provider, MD  carvedilol (COREG) 3.125 MG tablet TAKE 1/4 TABLET IN MORNING AND 1/2 TABLET IN THE EVENING 12/07/15  Yes Leonie Man, MD  chlorzoxazone (PARAFON) 500 MG tablet Take 500 mg by mouth 2 (two) times daily.  06/02/14  Yes Historical Provider, MD  Cholecalciferol (VITAMIN D) 2000 UNITS CAPS Take 1 capsule by mouth daily.   Yes Historical Provider, MD  clopidogrel (PLAVIX) 75 MG tablet Take 1 tablet (75 mg total) by mouth daily. 02/15/16  Yes Leonie Man, MD  ezetimibe-simvastatin (VYTORIN) 10-40 MG tablet Take 0.5 tablets by mouth daily. 03/24/16  Yes Leonie Man, MD  mometasone (ELOCON) 0.1 % cream Apply to affected area three times a day 01/22/15  Yes Historical Provider, MD  nitroGLYCERIN (NITROSTAT) 0.4 MG SL tablet Place 1 tablet (0.4 mg total) under the tongue every 5 (five) minutes as needed for chest pain. 04/03/15  Yes Leonie Man, MD  oxyCODONE-acetaminophen (PERCOCET) 10-325 MG per tablet Take 1 tablet by mouth as needed. 02/16/15  Yes Historical Provider, MD         Testosterone Hinda Kehr TD) Use as directed   Yes Historical Provider, MD     Allergies  Allergen Reactions  . Meperidine Hcl   . Penicillins     Social History   Social History  . Marital Status: Married    Spouse Name: N/A  . Number of Children: N/A  . Years of Education: N/A   Occupational History  . MANAGER [employer]   Social History Main Topics  . Smoking status: Former Smoker -- 1.00 packs/day for 7 years    Types: Cigarettes    Quit date: 09/06/1963  . Smokeless tobacco: Never Used  . Alcohol Use: No  . Drug Use: No  . Sexual Activity: Not Asked     Comment: 02/20/2015 refuses to answer sexually active   Other Topics Concern  . None   Social History Narrative   He is made to  patient of mine. He works for [employer], as a Engineer, manufacturing systems. He is a former smoker but quit in 1965 after 7 years. He does not drink alcohol.   He is very busy with work, and does not get routine exercise. He plans to work maybe one more year and then will retire. 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    Family History  Problem Relation Age of Onset  . Heart disease Father 63  . Cancer Father     Colon  . Heart disease Brother   . Cancer Brother     Colon  . Stroke Mother 42  . Hypertension Mother 64  . Hypertension Sister 74  . Hyperlipidemia Sister   . Hypertension Brother 14     Wt Readings from Last 3 Encounters:  03/24/16 189 lb 3.2 oz (85.821 kg)  09/23/15 192 lb (87.091 kg)  03/26/15 196 lb (88.905 kg)    PHYSICAL EXAM BP 144/82 mmHg  Pulse 68  Ht 5\' 7"  (1.702 m)  Wt 189 lb 3.2 oz (85.821 kg)  BMI 29.63 kg/m2 General appearance: alert, cooperative, appears stated age, no distress and borderline obese; actually pleasant mood and affect HEENT: [place]/AT, EOMI, MMM, anicteric sclera Neck: no adenopathy, no carotid bruit and no JVD Lungs: clear to auscultation bilaterally, normal percussion bilaterally and non-labored Heart: RRR, S1 & S2 normal, + S4, soft 1/6 c-d SEM @ RUSB-> carotids; No click, rub; nondisplaced PMI Abdomen: soft, non-tender; bowel sounds normal; no masses, no organomegaly; mildly protuberant abdomen Extremities: extremities normal, atraumatic, no cyanosis, and edema 1+ Right ankle, trace left ankle Pulses: 2+ and symmetric;  Skin: mobility and turgor normal  Neurologic: Mental status: Alert, oriented, thought content appropriate Cranial nerves: normal (II-XII grossly intact)    Adult ECG Report Not checked  Other studies Reviewed: Additional studies/ records that were reviewed today include:   Recent Labs: 03/14/2016 Na+ 136, K+ 5.1, Cl- 101, HCO3- 28 , BUN 13,  Cr 1.0, Glu 98, Ca2+ 8.9; AST 18, ALT 18; AlkP 79%, Alb 4.1, TP 6.0, T Bili 0.6 CBC: W 8.6, H/H 15.9/47.3, Plt 229 TC 169, TG 90, HDL 47,  LDL 104 -- he had been on Lipitor 10 mg, but did not tolerate. Is now back on Vytorin.   ASSESSMENT / PLAN: Problem List Items Addressed This Visit    PVC's (premature ventricular contractions) (Chronic)    Well-controlled on current dose of beta blocker. Most likely controlled following catheterization.      Relevant Medications   ezetimibe-simvastatin (VYTORIN) 10-40 MG tablet   Hyperlipidemia with target LDL less than 70 (Chronic)    We tried switching him to atorvastatin, but he did not tolerate it. He is happy on Vytorin. His labs are not that bad. Noted to not upset the upper cart, we will continue how it stands this point. Can consider trying something Livalo, however this pain issue from a financial standpoint. This would be a potential more potent option however.      Relevant Medications   ezetimibe-simvastatin (VYTORIN) 10-40 MG tablet   CAD S/P  PCI of Cx-OM w/ 2 Resolute DES 3.0 x 15 & 3.0 x 18 (3.55mm); PCI-pRCA: Promus Element DES 3.0 x 38 (3.69mm); dRCA-rPAV: Promus Premier DES 2.75x28 (3 mm) - Primary (Chronic)    Excellent symptomatic result from his last 2 PCI spells. He is not had any recurrent symptoms that are consistent with his angina which is essentially exertional dyspnea increased PVCs. He is happy, doing well. Very happy with the results. Very difficult medical treatment based on his medication intolerances. But he is on stable dose of carvedilol on along with aspirin and Plavix plus Vytorin. He still has Effient listed, but is actually now on Plavix.      Relevant Medications   ezetimibe-simvastatin (VYTORIN) 10-40 MG tablet   CAD (coronary artery disease) of artery bypass graft -atretic LIMA- circumflex, occluded SVG-RCA and SVG-diagonal. (Chronic)    He now has a RIMA-LAD and all other grafts occluded. His RCA and  circumflex have now been revascularized. Doing well. Because of the [place] and no vein grafts, right radial access is the best option for cath in him given his prostate issues.  Thankfully he does not need catheterization hopefully soon.      Relevant Medications   ezetimibe-simvastatin (VYTORIN) 10-40 MG tablet      Current medicines are reviewed at length with the patient today. (+/- concerns) none - needs refill of Xanax (rarely uses) & Vytorin. The following changes have been made: none PRN Xanax renewed - (Rx was outdated)  Studies Ordered:   No orders of the defined types were placed in this encounter.   Follow-up in 6 months.   [name], M.D., M.S. Interventional Cardiologist   Pager # [phone] Phone # [phone] [address] [place], [place] [address]

## 2016-03-24 NOTE — Patient Instructions (Signed)
Your physician wants you to follow-up in: 6 months or sooner if needed. You will receive a reminder letter in the mail two months in advance. If you don't receive a letter, please call our office to schedule the follow-up appointment.   If you need a refill on your cardiac medications before your next appointment, please call your pharmacy. 

## 2016-03-24 NOTE — Progress Notes (Signed)
Quick Note:  Recent Labs: 03/14/2016 Na+ 136, K+ 5.1, Cl- 101, HCO3- 28 , BUN 13, Cr 1.0, Glu 98, Ca2+ 8.9; AST 18, ALT 18; AlkP 79%, Alb 4.1, TP 6.0, T Bili 0.6 CBC: W 8.6, H/H 15.9/47.3, Plt 229 TC 169, TG 90, HDL 47, LDL 104 -- he had been on Lipitor 10 mg, but did not tolerate. Is now back on Vytorin.  [name], MD   ______

## 2016-03-26 ENCOUNTER — Encounter: Payer: Self-pay | Admitting: Cardiology

## 2016-03-26 NOTE — Assessment & Plan Note (Signed)
He now has a RIMA-LAD and all other grafts occluded. His RCA and circumflex have now been revascularized. Doing well. Because of the [place] and no vein grafts, right radial access is the best option for cath in him given his prostate issues.  Thankfully he does not need catheterization hopefully soon.

## 2016-03-26 NOTE — Assessment & Plan Note (Signed)
We tried switching him to atorvastatin, but he did not tolerate it. He is happy on Vytorin. His labs are not that bad. Noted to not upset the upper cart, we will continue how it stands this point. Can consider trying something Livalo, however this pain issue from a financial standpoint. This would be a potential more potent option however.

## 2016-03-26 NOTE — Assessment & Plan Note (Signed)
Well-controlled on current dose of beta blocker. Most likely controlled following catheterization.

## 2016-03-26 NOTE — Assessment & Plan Note (Signed)
Excellent symptomatic result from his last 2 PCI spells. He is not had any recurrent symptoms that are consistent with his angina which is essentially exertional dyspnea increased PVCs. He is happy, doing well. Very happy with the results. Very difficult medical treatment based on his medication intolerances. But he is on stable dose of carvedilol on along with aspirin and Plavix plus Vytorin. He still has Effient listed, but is actually now on Plavix.

## 2016-04-27 ENCOUNTER — Encounter: Payer: Self-pay | Admitting: Internal Medicine

## 2016-05-02 ENCOUNTER — Other Ambulatory Visit: Payer: Self-pay | Admitting: Cardiology

## 2016-05-02 NOTE — Telephone Encounter (Signed)
Rx(s) sent to pharmacy electronically.  

## 2016-07-11 ENCOUNTER — Encounter: Payer: Self-pay | Admitting: Internal Medicine

## 2016-09-13 ENCOUNTER — Encounter (INDEPENDENT_AMBULATORY_CARE_PROVIDER_SITE_OTHER): Payer: Self-pay

## 2016-09-13 ENCOUNTER — Encounter: Payer: Self-pay | Admitting: Internal Medicine

## 2016-09-13 ENCOUNTER — Ambulatory Visit (INDEPENDENT_AMBULATORY_CARE_PROVIDER_SITE_OTHER): Payer: 59 | Admitting: Internal Medicine

## 2016-09-13 VITALS — BP 148/78 | HR 80 | Ht 67.0 in | Wt 192.0 lb

## 2016-09-13 DIAGNOSIS — K219 Gastro-esophageal reflux disease without esophagitis: Secondary | ICD-10-CM

## 2016-09-13 DIAGNOSIS — I25810 Atherosclerosis of coronary artery bypass graft(s) without angina pectoris: Secondary | ICD-10-CM

## 2016-09-13 DIAGNOSIS — Z7902 Long term (current) use of antithrombotics/antiplatelets: Secondary | ICD-10-CM | POA: Diagnosis not present

## 2016-09-13 DIAGNOSIS — Z800 Family history of malignant neoplasm of digestive organs: Secondary | ICD-10-CM | POA: Diagnosis not present

## 2016-09-13 MED ORDER — NA SULFATE-K SULFATE-MG SULF 17.5-3.13-1.6 GM/177ML PO SOLN: 1.0000 | Freq: Once | ORAL | 0 refills | Status: AC

## 2016-09-13 NOTE — Patient Instructions (Signed)
Continue taking Prilosec over the counter  You have been scheduled for an endoscopy and colonoscopy. Please follow the written instructions given to you at your visit today. Please pick up your prep supplies at the pharmacy within the next 1-3 days. If you use inhalers (even only as needed), please bring them with you on the day of your procedure. Your physician has requested that you go to www.[email or web address].com and enter the access code given to you at your visit today. This web site gives a general overview about your procedure. However, you should still follow specific instructions given to you by our office regarding your preparation for the procedure.

## 2016-09-13 NOTE — Progress Notes (Signed)
HISTORY OF PRESENT ILLNESS:  [name] is a 76 y.o. male with multiple medical problems including coronary artery disease status post CABG, multiple prior coronary artery stent placement most recently June 2016 on daily aspirin and infrequent Plavix, hyperlipidemia, asthma, and arthritis. He also has a history of H. pylori related duodenitis which has been treated. Finally, history of GERD. The patient was last evaluated in this office October 2012. He was seen regarding screening colonoscopy given a family history of colon cancer. Index examination 2007 was negative for neoplasia. As the patient had recent coronary artery intervention and was on Effient, plans for colonoscopy was to be deferred one year. It was also recommended that he take daily PPI such as omeprazole which was prescribed given chronic GERD symptoms, history of duodenitis, and to reduce the risk of bleeding on chelation therapy. Patient is accompanied today by his wife. His chief complaint is the need for repeat screening colonoscopy, problems with GERD, and a sister with gastric cancer (died). He states that he did not return after he was seen previously due to heart issues requiring additional stenting. However, has been stable for greater than 18 months. Off Effient for greater than 6 months. Reports "Plavix allergy" for which he takes Plavix once every 4 days when he remembers in addition to daily baby aspirin. His brother succumbed to colon cancer in his 64s. Patient does have intermittent reflux symptoms which have been worse recently. No dysphagia. Does have some epigastric burning and bloating. No other complaints  REVIEW OF SYSTEMS:  All non-GI ROS negative except for sinus and allergy, back pain, visual change, irregular heartbeat  Past Medical History:  Diagnosis Date  . Allergic rhinitis   . Arthritis    "entire body" (02/19/2015)  . Asthma   . Asthmatic bronchitis   . CAD (coronary artery disease), native coronary  artery 07/2004   FATIGUE - PVCs -- CATH -> MV CAD --> CABG X 4  . 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 CAD S/P percutaneous coronary angioplasty 04/2011; 02/20/2015   a) PCI- Cx-OM W/ 2 overlapping Resolute DES 3x89mm & 3x67mm stents (3.79mm),  PCI to RCA -  Promus DES 3x10mm (3.59mm);;; b) 6/'16: PCI of dRCA-rPAV Promus Premier DES 2.75X28 (3.0 mm)  . CAS (cerebral atherosclerosis)    CAROTID DOPPLER, 08/25/2011 - mildly abnormal  . Chronic back pain   . Diverticulosis   . Erectile dysfunction   . Essential hypertension   . Family hx of colon cancer   . GERD (gastroesophageal reflux disease)   . Gout   . Hemorrhoids   . Hyperlipidemia with target LDL less than 70   . Kidney stones   . Myocardial infarction 07/2009  . Polycythemia   . 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 Symptomatic PVCs   . Syncope    2D ECHO, 07/13/2009 - EF >55%, normal    Past Surgical History:  Procedure Laterality Date  . CARDIAC CATHETERIZATION  07/15/2004   CABG recommended; continue medical therapy  . CARDIAC CATHETERIZATION  04/26/2011   Patent RIMA-LAD, atretic LIMA-circumflex (previous) 100 and occluded SVG to diagonal and SVG to PDA. LAD 80-90% eccentric stenosis at SP1 that has 70% stenosis. Circumflex: Large OM and another distal branch with 90-95% stenosis followed by  lesion in the OM branch. RCA dominant 6% lesion proximally and several old lesions. PDA occluded 70-80% annular lesion in the proximal RCA. 3 of 4 grafts occlu  . CARDIAC CATHETERIZATION N/A 02/20/2015   Procedure: Left Heart Cath and Coronary Angiography;  Surgeon: Leonie Man, MD;  Location: [facility];  Service: Cardiovascular;  Laterality: N/A;  . CATARACT EXTRACTION W/ INTRAOCULAR LENS  IMPLANT, BILATERAL  Bilateral ~ 2006/2007  . CORONARY ANGIOPLASTY WITH STENT PLACEMENT  04/28/2011; 02/20/2015   PCI-Cx-OM 2 overlapping Resolute DES 3 mm x15mm and 3x38mm --> 3.107mm; PCI to RCA - Promus Element DES 3x52mm --> 3.41mm;; b) dRCA-rPAV: Promus Premier DES 2.13mm X 28 mm (3.0 mm)   . CORONARY ARTERY BYPASS GRAFT  07/23/2004   LIMA-Cx RIMA-LAD, SVG-diagonal, SVG-PDA  . TRANSTHORACIC ECHOCARDIOGRAM  02/18/2015    Mild concentric LVH, EF 55-60%, no RWMA, Gr 1 DD, aortic sclerosis without stenosis  . TRANSURETHRAL RESECTION OF PROSTATE  ~ 2002/2003    Social History Henrick A Duffett  reports that he quit smoking about 53 years ago. His smoking use included Cigarettes. He has a 7.00 pack-year smoking history. He has never used smokeless tobacco. He reports that he does not drink alcohol or use drugs.  family history includes Colon cancer in his brother; Heart disease in his brother; Heart disease (age of onset: 50) in his father; Hypertension (age of onset: 63) in his brother; Hypertension (age of onset: 4) in his mother; Stomach cancer in his sister; Stroke (age of onset: 67) in his mother.  Allergies  Allergen Reactions  . Meperidine Hcl   . Penicillins        PHYSICAL EXAMINATION: Vital signs: BP (!) 148/78   Pulse 80   Ht 5\' 7"  (1.702 m)   Wt 192 lb (87.1 kg)   BMI 30.07 kg/m   Constitutional: generally well-appearing, no acute distress Psychiatric: alert and oriented x3, cooperative Eyes: extraocular movements intact, anicteric, conjunctiva pink Mouth: oral pharynx moist, no lesions Neck: supple no lymphadenopathy Cardiovascular: heart regular rate and rhythm, no murmur Lungs: clear to auscultation bilaterally Abdomen: soft, nontender, nondistended, no obvious ascites, no peritoneal signs, normal bowel sounds, no organomegaly Rectal: Deferred Extremities: no clubbing cyanosis or lower extremity edema bilaterally Skin: no lesions on visible extremities Neuro: No focal deficits. Nerves  intact.No asterixis.   ASSESSMENT:  #1. GERD. Active symptoms #2. Dyspepsia. History of duodenitis with H. pylori. Rule out recurrence of the same #3. Family history of colon cancer. Last screening exam 2007. Due for follow-up #4. Family history of gastric cancer  PLAN:  #1. Schedule colonoscopy. Patient can continue on his current Plavix and aspirin regimen. The patient is high-risk given his age and comorbidities.The nature of the procedure, as well as the risks, benefits, and alternatives were carefully and thoroughly reviewed with the patient. Ample time for discussion and questions allowed. The patient understood, was satisfied, and agreed to proceed. #2. Schedule upper endoscopy to evaluate dyspeptic symptoms and assure eradication of H. pylori, particularly given family history of gastric cancer.The nature of the procedure, as well as the risks, benefits, and alternatives were carefully and thoroughly reviewed with the patient. Ample time for discussion and questions allowed. The patient understood, was satisfied, and agreed to proceed. #3. Prescribe omeprazole 20 mg daily #4. Reflux precautions #5. Ongoing general medical care with PCP

## 2016-09-28 ENCOUNTER — Ambulatory Visit (INDEPENDENT_AMBULATORY_CARE_PROVIDER_SITE_OTHER): Payer: 59 | Admitting: Cardiology

## 2016-09-28 ENCOUNTER — Encounter: Payer: Self-pay | Admitting: Cardiology

## 2016-09-28 VITALS — BP 122/70 | HR 72 | Ht 68.0 in | Wt 191.0 lb

## 2016-09-28 DIAGNOSIS — I493 Ventricular premature depolarization: Secondary | ICD-10-CM | POA: Diagnosis not present

## 2016-09-28 DIAGNOSIS — I2510 Atherosclerotic heart disease of native coronary artery without angina pectoris: Secondary | ICD-10-CM

## 2016-09-28 DIAGNOSIS — I25810 Atherosclerosis of coronary artery bypass graft(s) without angina pectoris: Secondary | ICD-10-CM | POA: Diagnosis not present

## 2016-09-28 DIAGNOSIS — E785 Hyperlipidemia, unspecified: Secondary | ICD-10-CM

## 2016-09-28 DIAGNOSIS — Z9861 Coronary angioplasty status: Secondary | ICD-10-CM

## 2016-09-28 NOTE — Assessment & Plan Note (Signed)
He had a pretty excellent result from a symptom standpoint after his last PCI. He continues to be doing well from a cardiac standpoint. He continues to be on Plavix, low-dose beta blocker and Vytorin.  He is reluctant to try any other type of blood pressure medication, and usually has relatively well-controlled blood pressures.  Would not do a stress test now unless he has further symptoms.

## 2016-09-28 NOTE — Progress Notes (Signed)
PCP: Wende Neighbors, MD  Clinic Note: Chief Complaint  Patient presents with  . Follow-up    pt denied chest pain and SOB  . Coronary Artery Disease    History of CABG several vessel PCI    HPI: [name] is a 76 y.o. male with a PMH below who presents today for Six-month follow-up of CAD with prior history of CABG followed by PCI. Most recent PCI was in June 2016 where he had PCI of the posterior AV groove branch with a DES stent. Since then he has done very well with no active anginal symptoms.Lorin Picket A Bucker was last seen on July 2017. He was doing well. In great spirits. No complaints at that time.  Recent Hospitalizations: None  Studies Reviewed: None  Interval History:  [name] presents today again without any major complaints from a cardiac standpoint. He still has some of his mild basal dyspnea with exertion, notes that it is improving some.   If he backs off on his BB dose, he may have some palpitations, but not a lot.   He was seen today along with his wife who told me confidentiality after he was out of the room that he has been having episodes of pretty significant tremors that when I really haven't been as he thought he would try to take action dose of beta blocker to see if would help, but this only made him more lethargic. He gets frustrated frustrated, and his blood pressure goes up during these episodes.  He also knows that he decided to stop taking his Vytorin for a while, and is cholesterol level by the December evaluation notably increased in the wrong direction. He therefore went back on Lipitor for a few weeks prior to restarting Vytorin. He is due to have another check in the next couple months.  He is still working, and has not really gotten into a full exercise routine, but has indicated that his energy level has been okay.   No chest pain or shortness of breath with rest or exertion. No PND, orthopnea or edema.  No lightheadedness, dizziness, weakness or  syncope/near syncope. No TIA/amaurosis fugax symptoms. No melena, hematochezia, hematuria, or epstaxis. No claudication.  ROS: A comprehensive was performed. Review of Systems  Constitutional: Positive for weight loss. Negative for malaise/fatigue.  HENT: Negative for nosebleeds.   Respiratory: Positive for shortness of breath (Mild baseline).   Cardiovascular: Negative.        Per history of present illness  Gastrointestinal: Positive for nausea (He notes having nausea that occurs after lying down or being on the floor for short period time and then sitting up.).  Musculoskeletal: Negative for falls, joint pain and myalgias.  Neurological: Positive for tremors and headaches. Negative for dizziness and focal weakness.  Endo/Heme/Allergies: Positive for environmental allergies.  Psychiatric/Behavioral: Negative for memory loss. The patient is nervous/anxious (Has rare occasions. He needs refill of his Xanax, because he finally finished the bottle that was from over a year ago, and the refill was given is from a  outdated  Prescription). The patient does not have insomnia.   All other systems reviewed and are negative.   Past Medical History:  Diagnosis Date  . Allergic rhinitis   . Arthritis    "entire body" (02/19/2015)  . Asthma   . Asthmatic bronchitis   . CAD (coronary artery disease), native coronary artery 07/2004   FATIGUE - PVCs -- CATH -> MV CAD --> CABG X 4  .  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 CAD S/P percutaneous coronary angioplasty 04/2011; 02/20/2015   a) PCI- Cx-OM W/ 2 overlapping Resolute DES 3x12mm & 3x57mm stents (3.17mm),  PCI to RCA -  Promus DES 3x74mm (3.47mm);;; b) 6/'16: PCI of dRCA-rPAV Promus Premier DES 2.75X28 (3.0 mm)  . CAS (cerebral atherosclerosis)    CAROTID  DOPPLER, 08/25/2011 - mildly abnormal  . Chronic back pain   . Diverticulosis   . Erectile dysfunction   . Essential hypertension   . Family hx of colon cancer   . GERD (gastroesophageal reflux disease)   . Gout   . Hemorrhoids   . Hyperlipidemia with target LDL less than 70   . Kidney stones   . Myocardial infarction 07/2009  . Polycythemia   . 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 Symptomatic PVCs   . Syncope    2D ECHO, 07/13/2009 - EF >55%, normal    Past Surgical History:  Procedure Laterality Date  . CARDIAC CATHETERIZATION  07/15/2004   CABG recommended; continue medical therapy  . CARDIAC CATHETERIZATION  04/26/2011   Patent RIMA-LAD, atretic LIMA-circumflex (previous) 100 and occluded SVG to diagonal and SVG to PDA. LAD 80-90% eccentric stenosis at SP1 that has 70% stenosis. Circumflex: Large OM and another distal branch with 90-95% stenosis followed by lesion in the OM branch. RCA dominant 6% lesion proximally and several old lesions. PDA occluded 70-80% annular lesion in the proximal RCA. 3 of 4 grafts occlu  . CARDIAC CATHETERIZATION N/A 02/20/2015   Procedure: Left Heart Cath and Coronary Angiography;  Surgeon: Leonie Man, MD;  Location: [facility];  Service: Cardiovascular;  Laterality: N/A;  . CATARACT EXTRACTION W/ INTRAOCULAR LENS  IMPLANT, BILATERAL Bilateral ~ 2006/2007  . CORONARY ANGIOPLASTY WITH STENT PLACEMENT  04/28/2011; 02/20/2015   PCI-Cx-OM 2 overlapping Resolute DES 3 mm x67mm and 3x95mm --> 3.75mm; PCI to RCA - Promus Element DES 3x1mm --> 3.87mm;; b) dRCA-rPAV: Promus Premier DES 2.37mm X 28 mm (3.0 mm)   . CORONARY ARTERY BYPASS GRAFT  07/23/2004   LIMA-Cx RIMA-LAD, SVG-diagonal, SVG-PDA  . TRANSTHORACIC ECHOCARDIOGRAM  02/18/2015    Mild concentric LVH, EF 55-60%, no RWMA, Gr 1 DD, aortic sclerosis without stenosis  . TRANSURETHRAL RESECTION OF PROSTATE  ~ 2002/2003    Current Meds  Medication Sig  . albuterol  (PROVENTIL HFA;VENTOLIN HFA) 108 (90 BASE) MCG/ACT inhaler Inhale 1 puff into the lungs every 6 (six) hours as needed for wheezing or shortness of breath.  . ALPRAZolam (XANAX) 0.5 MG tablet Take 1 tablet (0.5 mg total) by mouth daily as needed for anxiety. As needed  . aspirin EC 81 MG tablet Take 1 tablet (81 mg total) by mouth daily.  . carvedilol (COREG) 3.125 MG tablet TAKE 1/4 TABLET IN MORNING AND 1/2 TABLET IN THE EVENING  . chlorzoxazone (PARAFON) 500 MG tablet Take 500 mg by mouth as needed.   . Cholecalciferol (VITAMIN D) 2000 UNITS CAPS Take 1 capsule by mouth daily.  . clopidogrel (PLAVIX) 75 MG tablet Take 75 mg by mouth. Every 4 days  . ezetimibe-simvastatin (VYTORIN) 10-40 MG tablet Take 0.5 tablets by mouth daily.  . mometasone (ELOCON) 0.1 % cream Apply to affected area three times a day  . nitroGLYCERIN (NITROSTAT)  0.4 MG SL tablet Place 1 tablet (0.4 mg total) under the tongue every 5 (five) minutes as needed for chest pain.  Marland Kitchen oxyCODONE-acetaminophen (PERCOCET) 10-325 MG per tablet Take 1 tablet by mouth as needed.  . Testosterone (AXIRON TD) Use as directed  . Testosterone 30 MG/ACT SOLN Apply 30 mg topically daily.    Allergies  Allergen Reactions  . Meperidine Hcl   . Penicillins     Social History   Social History  . Marital status: Married    Spouse name: N/A  . Number of children: 3  . Years of education: N/A   Occupational History  . MANAGER [employer]   Social History Main Topics  . Smoking status: Former Smoker    Packs/day: 1.00    Years: 7.00    Types: Cigarettes    Quit date: 09/06/1963  . Smokeless tobacco: Never Used  . Alcohol use No  . Drug use: No  . Sexual activity: Not Asked     Comment: 02/20/2015 refuses to answer sexually active   Other Topics Concern  . None   Social History Narrative   He is made to patient of mine. He works for [employer], as a Engineer, manufacturing systems. He is a former smoker but quit in 1965 after 7 years. He does  not drink alcohol.   He is very busy with work, and does not get routine exercise. He plans to work maybe one more year and then will retire. 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    Family History  Problem Relation Age of Onset  . Heart disease Father 42  . Heart disease Brother   . Colon cancer Brother     and liver cancer  . Stroke Mother 35  . Hypertension Mother 71  . Stomach cancer Sister   . Hypertension Brother 66  . Esophageal cancer Neg Hx   . Rectal cancer Neg Hx      Wt Readings from Last 3 Encounters:  09/28/16 86.6 kg (191 lb)  09/13/16 87.1 kg (192 lb)  03/24/16 85.8 kg (189 lb 3.2 oz)    PHYSICAL EXAM BP 122/70   Pulse 72   Ht 5\' 8"  (1.727 m)   Wt 86.6 kg (191 lb)   BMI 29.04 kg/m  General appearance: alert, cooperative, appears stated age, no distress and borderline obese; actually pleasant mood and affect HEENT: /AT, EOMI, MMM, anicteric sclera Neck: no adenopathy, no carotid bruit and no JVD Lungs: clear to auscultation bilaterally, normal percussion bilaterally and non-labored Heart: RRR, S1 & S2 normal, + S4, soft 1/6 c-d SEM @ RUSB-> carotids; No click, rub; nondisplaced PMI Abdomen: soft, non-tender; bowel sounds normal; no masses, no organomegaly; mildly protuberant abdomen Extremities: extremities normal, atraumatic, no cyanosis, and edema 1+ Right ankle, trace left ankle Pulses: 2+ and symmetric;  Skin: mobility and turgor normal  Neurologic: Mental status: Alert, oriented, thought content appropriate Cranial nerves: normal (II-XII grossly intact)    Adult ECG Report Not checked  Other studies Reviewed: Additional studies/ records that were reviewed today include:   Recent Labs: 03/14/2016 -- was checked by PCP recently (not available) Na+ 136, K+ 5.1, Cl- 101, HCO3- 28 , BUN 13, Cr 1.0, Glu 98, Ca2+ 8.9; AST 18, ALT 18; AlkP 79%, Alb 4.1, TP 6.0, T Bili  0.6 CBC: W 8.6, H/H 15.9/47.3, Plt 229 TC 169, TG 90, HDL 47, LDL 104 --  he had been on Lipitor 10 mg, but did not tolerate. Is now back on Vytorin.   ASSESSMENT / PLAN: Problem List Items Addressed This Visit    CAD (coronary artery disease) of artery bypass graft -atretic LIMA- circumflex, occluded SVG-RCA and SVG-diagonal. - Primary (Chronic)    RIMA-LAD patent with all other grafts occluded. Has had stents to the RCA and circumflex and now doing well. Would be acceptable for radial access now in the future if necessary.      Relevant Orders   EKG 12-Lead   CAD S/P  PCI of Cx-OM w/ 2 Resolute DES 3.0 x 15 & 3.0 x 18 (3.9mm); PCI-pRCA: Promus Element DES 3.0 x 38 (3.62mm); dRCA-rPAV: Promus Premier DES 2.75x28 (3 mm) (Chronic)    He had a pretty excellent result from a symptom standpoint after his last PCI. He continues to be doing well from a cardiac standpoint. He continues to be on Plavix, low-dose beta blocker and Vytorin.  He is reluctant to try any other type of blood pressure medication, and usually has relatively well-controlled blood pressures.  Would not do a stress test now unless he has further symptoms.      Hyperlipidemia with target LDL less than 70 (Chronic)    He continues to have difficulty getting his lipid panel to goal on Vytorin read did not tolerate atorvastatin although it does better. At this point I think our best option would be to consider a trial of a PCSK9 inhibitor type medication. Refer him to Tommy Medal, [facility] in our pharmacist run lipid clinic to see if he would qualify for the [place] or Praluent. There is also a chance for him to potentially qualify for a new study drug.      Relevant Orders   EKG 12-Lead   PVC's (premature ventricular contractions) (Chronic)    Well-controlled on current dose of beta blocker.      Relevant Orders   EKG 12-Lead      Current medicines are reviewed at length with the patient today. (+/- concerns) none -  needs refill of Xanax (rarely uses) & Vytorin. The following changes have been made: none PRN Xanax renewed - (Rx was outdated)  Studies Ordered:   Orders Placed This Encounter  Procedures  . EKG 12-Lead   Follow-up in 6 months.   [name], M.D., M.S. Interventional Cardiologist   Pager # [phone] Phone # [phone] [address] [place], [place] [address]

## 2016-09-28 NOTE — Assessment & Plan Note (Signed)
RIMA-LAD patent with all other grafts occluded. Has had stents to the RCA and circumflex and now doing well. Would be acceptable for radial access now in the future if necessary.

## 2016-09-28 NOTE — Assessment & Plan Note (Signed)
He continues to have difficulty getting his lipid panel to goal on Vytorin read did not tolerate atorvastatin although it does better. At this point I think our best option would be to consider a trial of a PCSK9 inhibitor type medication. Refer him to Tommy Medal, [facility] in our pharmacist run lipid clinic to see if he would qualify for the [place] or Praluent. There is also a chance for him to potentially qualify for a new study drug.

## 2016-09-28 NOTE — Assessment & Plan Note (Signed)
Well-controlled on current dose of beta-blocker. 

## 2016-09-28 NOTE — Patient Instructions (Signed)
NO CHANGE WITH CURRENT TREATMENT  AND MEDICATION     Your physician recommends that you schedule a follow-up appointment in with [place]   Your physician wants you to follow-up in [place]. You will receive a reminder letter in the mail two months in advance. If you don't receive a letter, please call our office to schedule the follow-up appointment.   If you need a refill on your cardiac medications before your next appointment, please call your pharmacy.

## 2016-09-29 ENCOUNTER — Telehealth: Payer: Self-pay | Admitting: Pharmacist Clinician (PhC)/ Clinical Pharmacy Specialist

## 2016-09-29 NOTE — Telephone Encounter (Signed)
-----   Message from Rockne Menghini, [place] sent at 09/29/2016  1:32 PM EST -----   ----- Message ----- From: Leonie Man, MD Sent: 09/28/2016   2:21 PM To: Rockne Menghini, RPH-CPP  Erasmo Downer, I think this may be a good candidate to try on the new trial medication.

## 2016-09-29 NOTE — Progress Notes (Signed)
To [facility]

## 2016-09-29 NOTE — Telephone Encounter (Signed)
Spoke with patient, not interested in [place] or Praluent thru insurance at this time.  Reviewed options for CLEAR and ORION-10 as well as neurocog-Praluent study.  Patient interested in [place].  Will review chart information and forward to Berneda Rose RN

## 2016-11-01 ENCOUNTER — Telehealth: Payer: Self-pay | Admitting: Internal Medicine

## 2016-11-02 MED ORDER — NA SULFATE-K SULFATE-MG SULF 17.5-3.13-1.6 GM/177ML PO SOLN: 1.0000 | Freq: Once | ORAL | 0 refills | Status: AC

## 2016-11-02 NOTE — Telephone Encounter (Signed)
Prep sent to [facility] in [place]

## 2016-11-09 ENCOUNTER — Ambulatory Visit (AMBULATORY_SURGERY_CENTER): Payer: 59 | Admitting: Internal Medicine

## 2016-11-09 ENCOUNTER — Encounter: Payer: Self-pay | Admitting: Internal Medicine

## 2016-11-09 VITALS — BP 131/65 | HR 62 | Temp 98.2°F | Resp 11 | Ht 68.0 in | Wt 191.0 lb

## 2016-11-09 DIAGNOSIS — D120 Benign neoplasm of cecum: Secondary | ICD-10-CM | POA: Diagnosis not present

## 2016-11-09 DIAGNOSIS — Z1211 Encounter for screening for malignant neoplasm of colon: Secondary | ICD-10-CM

## 2016-11-09 DIAGNOSIS — Z800 Family history of malignant neoplasm of digestive organs: Secondary | ICD-10-CM | POA: Diagnosis not present

## 2016-11-09 DIAGNOSIS — Z1212 Encounter for screening for malignant neoplasm of rectum: Secondary | ICD-10-CM

## 2016-11-09 DIAGNOSIS — K219 Gastro-esophageal reflux disease without esophagitis: Secondary | ICD-10-CM | POA: Diagnosis not present

## 2016-11-09 DIAGNOSIS — D124 Benign neoplasm of descending colon: Secondary | ICD-10-CM

## 2016-11-09 MED ORDER — SODIUM CHLORIDE 0.9 % IV SOLN: 500.0000 mL | INTRAVENOUS | Status: DC

## 2016-11-09 NOTE — Patient Instructions (Signed)
Hand outs given: Hemorrhoids, High- fiber diet, Diverticulosis, and Polyps Continue present medications   YOU HAD AN ENDOSCOPIC PROCEDURE TODAY: Refer to the procedure report and other information in the discharge instructions given to you for any specific questions about what was found during the examination. If this information does not answer your questions, please call [place] office at [phone] to clarify.   YOU SHOULD EXPECT: Some feelings of bloating in the abdomen. Passage of more gas than usual. Walking can help get rid of the air that was put into your GI tract during the procedure and reduce the bloating. If you had a lower endoscopy (such as a colonoscopy or flexible sigmoidoscopy) you may notice spotting of blood in your stool or on the toilet paper. Some abdominal soreness may be present for a day or two, also.  DIET: Your first meal following the procedure should be a light meal and then it is ok to progress to your normal diet. A half-sandwich or bowl of soup is an example of a good first meal. Heavy or fried foods are harder to digest and may make you feel nauseous or bloated. Drink plenty of fluids but you should avoid alcoholic beverages for 24 hours. If you had a esophageal dilation, please see attached instructions for diet.    ACTIVITY: Your care partner should take you home directly after the procedure. You should plan to take it easy, moving slowly for the rest of the day. You can resume normal activity the day after the procedure however YOU SHOULD NOT DRIVE, use power tools, machinery or perform tasks that involve climbing or major physical exertion for 24 hours (because of the sedation medicines used during the test).   SYMPTOMS TO REPORT IMMEDIATELY: A gastroenterologist can be reached at any hour. Please call [phone]  for any of the following symptoms:  Following lower endoscopy (colonoscopy, flexible sigmoidoscopy) Excessive amounts of blood in the stool   Significant tenderness, worsening of abdominal pains  Swelling of the abdomen that is new, acute  Fever of 100 or higher  Following upper endoscopy (EGD, EUS, ERCP, esophageal dilation) Vomiting of blood or coffee ground material  New, significant abdominal pain  New, significant chest pain or pain under the shoulder blades  Painful or persistently difficult swallowing  New shortness of breath  Black, tarry-looking or red, bloody stools  FOLLOW UP:  If any biopsies were taken you will be contacted by phone or by letter within the next 1-3 weeks. Call [phone]  if you have not heard about the biopsies in 3 weeks.  Please also call with any specific questions about appointments or follow up tests.

## 2016-11-09 NOTE — Progress Notes (Signed)
Report to PACU, RN, vss, BBS= Clear.  

## 2016-11-09 NOTE — Progress Notes (Signed)
Called to room to assist during endoscopic procedure.  Patient ID and intended procedure confirmed with present staff. Received instructions for my participation in the procedure from the performing physician.  

## 2016-11-09 NOTE — Op Note (Signed)
[facility] Patient Name: [name] Procedure Date: 11/09/2016 1:42 PM MRN: [id number] Endoscopist: Docia Chuck. Henrene Pastor , MD Age: 77 Referring MD:  Date of Birth: 1941-02-27 Gender: Male Account #: [id number] Procedure:                Upper GI endoscopy, with biopsies Indications:              Screening procedure, , Esophageal reflux, Follow-up                            of Helicobacter pylori(sister with gastric cancer).                            Previously treated. Medicines:                Monitored Anesthesia Care Procedure:                Pre-Anesthesia Assessment:                           - Prior to the procedure, a History and Physical                            was performed, and patient medications and                            allergies were reviewed. The patient's tolerance of                            previous anesthesia was also reviewed. The risks                            and benefits of the procedure and the sedation                            options and risks were discussed with the patient.                            All questions were answered, and informed consent                            was obtained. Prior Anticoagulants: The patient has                            taken Plavix (clopidogrel), last dose was 1 day                            prior to procedure. ASA Grade Assessment: III - A                            patient with severe systemic disease. After                            reviewing the risks and benefits, the patient was  deemed in satisfactory condition to undergo the                            procedure.                           After obtaining informed consent, the endoscope was                            passed under direct vision. Throughout the                            procedure, the patient's blood pressure, pulse, and                            oxygen saturations were monitored continuously. The                    Model [id number]) scope was introduced                            through the mouth, and advanced to the second part                            of duodenum. The upper GI endoscopy was                            accomplished without difficulty. The patient                            tolerated the procedure well. Scope In: Scope Out: Findings:                 The esophagus was normal.                           The stomach was normal. Biopsies were taken with a                            cold forceps for Helicobacter pylori testing using                            CLOtest.                           The examined duodenum was normal.                           The cardia and gastric fundus were normal on                            retroflexion. Complications:            No immediate complications. Estimated Blood Loss:     Estimated blood loss: none. Impression:               - Normal esophagus.                           -  Normal stomach. Biopsied.                           - Normal examined duodenum. Recommendation:           - Patient has a contact number available for                            emergencies. The signs and symptoms of potential                            delayed complications were discussed with the                            patient. Return to normal activities tomorrow.                            Written discharge instructions were provided to the                            patient.                           - Resume previous diet.                           - Continue present medications, including Plavix.                           - Return to the care of your primary provider. GI                            follow-up as needed Docia Chuck. Henrene Pastor, MD 11/09/2016 2:19:53 PM This report has been signed electronically.

## 2016-11-09 NOTE — Op Note (Signed)
[facility] Patient Name: [name] Procedure Date: 11/09/2016 1:42 PM MRN: [id number] Endoscopist: Docia Chuck. Henrene Pastor , MD Age: 76 Referring MD:  Date of Birth: 08-18-1941 Gender: Male Account #: [id number] Procedure:                Colonoscopy, with cold snare polypectomy X2 Indications:              Screening in patient at increased risk: Colorectal                            cancer in brother before age 15 Medicines:                Monitored Anesthesia Care Procedure:                Pre-Anesthesia Assessment:                           - Prior to the procedure, a History and Physical                            was performed, and patient medications and                            allergies were reviewed. The patient's tolerance of                            previous anesthesia was also reviewed. The risks                            and benefits of the procedure and the sedation                            options and risks were discussed with the patient.                            All questions were answered, and informed consent                            was obtained. Prior Anticoagulants: The patient has                            taken Plavix (clopidogrel), last dose was 1 day                            prior to procedure. ASA Grade Assessment: III - A                            patient with severe systemic disease. After                            reviewing the risks and benefits, the patient was                            deemed in satisfactory condition to undergo the  procedure.                           After obtaining informed consent, the colonoscope                            was passed under direct vision. Throughout the                            procedure, the patient's blood pressure, pulse, and                            oxygen saturations were monitored continuously. The                            Model CF-HQ190L [id number]) scope was  introduced                            through the anus and advanced to the the cecum,                            identified by appendiceal orifice and ileocecal                            valve. The ileocecal valve, appendiceal orifice,                            and rectum were photographed. The quality of the                            bowel preparation was excellent. The colonoscopy                            was performed without difficulty. The patient                            tolerated the procedure well. The bowel preparation                            used was SUPREP. Scope In: 1:45:26 PM Scope Out: 2:01:59 PM Scope Withdrawal Time: 0 hours 13 minutes 54 seconds  Total Procedure Duration: 0 hours 16 minutes 33 seconds  Findings:                 Three polyps were found in the descending colon and                            cecum. The polyps were 2 to 3 mm in size. These                            polyps were removed with a cold snare. Resection                            and retrieval were complete.  Multiple diverticula were found in the left colon.                           Internal hemorrhoids were found during retroflexion.                           The exam was otherwise without abnormality on                            direct and retroflexion views. Complications:            No immediate complications. Estimated blood loss:                            None. Estimated Blood Loss:     Estimated blood loss: none. Impression:               - Three 2 to 3 mm polyps in the descending colon                            and in the cecum, removed with a cold snare.                            Resected and retrieved.                           - Diverticulosis in the left colon.                           - Internal hemorrhoids.                           - The examination was otherwise normal on direct                            and retroflexion  views. Recommendation:           - Repeat colonoscopy is not recommended for                            surveillance.                           - EGD today. Please see report.                           - Resume previous diet.                           - Continue present medications, including Plavix.                           - Await pathology results. Docia Chuck. Henrene Pastor, MD 11/09/2016 2:15:44 PM This report has been signed electronically.

## 2016-11-10 ENCOUNTER — Other Ambulatory Visit (INDEPENDENT_AMBULATORY_CARE_PROVIDER_SITE_OTHER): Payer: 59

## 2016-11-10 ENCOUNTER — Telehealth: Payer: Self-pay | Admitting: *Deleted

## 2016-11-10 DIAGNOSIS — K219 Gastro-esophageal reflux disease without esophagitis: Secondary | ICD-10-CM | POA: Diagnosis not present

## 2016-11-10 LAB — HELICOBACTER PYLORI SCREEN-BIOPSY: UREASE: NEGATIVE

## 2016-11-10 NOTE — Telephone Encounter (Signed)
  Follow up Call-  Call back number 11/09/2016  Post procedure Call Back phone  # [phone]  Permission to leave phone message Yes  Some recent data might be hidden     Patient questions:  Do you have a fever, pain , or abdominal swelling? No. Pain Score  0 *  Have you tolerated food without any problems? Yes.    Have you been able to return to your normal activities? Yes.    Do you have any questions about your discharge instructions: Diet   No. Medications  No. Follow up visit  No.  Do you have questions or concerns about your Care? No.  Actions: * If pain score is 4 or above: No action needed, pain <4.

## 2016-11-15 ENCOUNTER — Encounter: Payer: Self-pay | Admitting: Internal Medicine

## 2016-12-12 ENCOUNTER — Telehealth: Payer: Self-pay | Admitting: Cardiology

## 2016-12-12 NOTE — Telephone Encounter (Signed)
New message    Pt is calling if he should be on antibiotics before having dental work done?

## 2016-12-12 NOTE — Telephone Encounter (Signed)
Returned call to patient.He stated he wanted to ask Dr.Harding if he needed antibiotics before dental work this Wed 12/14/16. He is scheduled to have a crown put on.Advised I will send message to [facility] for advice.

## 2016-12-13 NOTE — Telephone Encounter (Signed)
PATIENT AWARE , VOICE UNDERSTANDING.

## 2016-12-13 NOTE — Telephone Encounter (Signed)
No need for antibiotics prophylactically.  [name], MD

## 2017-02-03 ENCOUNTER — Telehealth: Payer: Self-pay | Admitting: Cardiology

## 2017-02-03 DIAGNOSIS — E785 Hyperlipidemia, unspecified: Secondary | ICD-10-CM

## 2017-02-03 DIAGNOSIS — Z9861 Coronary angioplasty status: Secondary | ICD-10-CM

## 2017-02-03 DIAGNOSIS — I2510 Atherosclerotic heart disease of native coronary artery without angina pectoris: Secondary | ICD-10-CM

## 2017-02-03 DIAGNOSIS — Z79899 Other long term (current) drug therapy: Secondary | ICD-10-CM

## 2017-02-03 NOTE — Telephone Encounter (Signed)
New Message  Pt voiced he is needing lab work a week prior to appt.  Please f/u with pt with date

## 2017-02-03 NOTE — Telephone Encounter (Signed)
LABS ORDERED CMP LIPID PATIENT NOTIFIED.

## 2017-02-03 NOTE — Telephone Encounter (Signed)
Spoke with pt he states that he has appt 03-23-2017 and would like [facility] lab orders put in for him to have done before his appt.

## 2017-03-09 ENCOUNTER — Other Ambulatory Visit: Payer: Self-pay | Admitting: Cardiology

## 2017-03-23 ENCOUNTER — Encounter: Payer: Self-pay | Admitting: Cardiology

## 2017-03-23 ENCOUNTER — Ambulatory Visit (INDEPENDENT_AMBULATORY_CARE_PROVIDER_SITE_OTHER): Payer: 59 | Admitting: Cardiology

## 2017-03-23 VITALS — BP 156/79 | HR 86 | Ht 68.0 in | Wt 193.8 lb

## 2017-03-23 DIAGNOSIS — E785 Hyperlipidemia, unspecified: Secondary | ICD-10-CM | POA: Diagnosis not present

## 2017-03-23 DIAGNOSIS — I25708 Atherosclerosis of coronary artery bypass graft(s), unspecified, with other forms of angina pectoris: Secondary | ICD-10-CM

## 2017-03-23 DIAGNOSIS — I10 Essential (primary) hypertension: Secondary | ICD-10-CM

## 2017-03-23 DIAGNOSIS — Z136 Encounter for screening for cardiovascular disorders: Secondary | ICD-10-CM | POA: Diagnosis not present

## 2017-03-23 DIAGNOSIS — I2510 Atherosclerotic heart disease of native coronary artery without angina pectoris: Secondary | ICD-10-CM | POA: Diagnosis not present

## 2017-03-23 DIAGNOSIS — Z9861 Coronary angioplasty status: Secondary | ICD-10-CM

## 2017-03-23 DIAGNOSIS — R600 Localized edema: Secondary | ICD-10-CM

## 2017-03-23 DIAGNOSIS — I493 Ventricular premature depolarization: Secondary | ICD-10-CM

## 2017-03-23 NOTE — Patient Instructions (Signed)
Medication Instructions: No changes   Procedures/Testing: Your physician has requested that you have an abdominal aorta duplex in December. During this test, an ultrasound is used to evaluate the aorta. Allow 30 minutes for this exam. Do not eat after midnight the day before and avoid carbonated beverages   Follow-Up: Your physician wants you to follow-up in: late December after the Abdominal Aorta. You will receive a reminder letter in the mail two months in advance. If you don't receive a letter, please call our office to schedule the follow-up appointment.    If you need a refill on your cardiac medications before your next appointment, please call your pharmacy.

## 2017-03-23 NOTE — Progress Notes (Addendum)
PCP: [name], MD  Clinic Note: Chief Complaint  Patient presents with  . Follow-up    CAD    HPI: [name] is a 76 y.o. male with a PMH below who presents today for six-month follow-up of his CAD. Loretta is a former patient of Dr. Terance Ice who transferred to me several years ago after a performed cardiac catheterization and PCI when he had vein graft disease. Most recent PCI was in June 2016 PCI to the posterior AV groove branch with a drug when he stent.  Renny A Ruta was last seen in January 2018. He was doing well with no active symptoms.  I referred him to see [facility] for management of his lipids, but he declined.  Recent Hospitalizations: None  Studies Personally Reviewed - (if available, images/films reviewed: From Epic Chart or Care Everywhere)  None  Interval History: [name] presents today overall doing quite well. No active symptoms. He is not having any active anginal symptoms or heart failure symptoms. He notes is that he sometimes has some wheezing issues 20 lies on his back so he sleeps on his side or stomach. No real PND, orthopnea or edema symptoms. No resting or exertional chest tightness or pressure symptoms. He has occasional fleeting PVCs, but nothing significant nothing like when he when I first met him  No lightheadedness, dizziness, weakness or syncope/near syncope. No TIA/amaurosis fugax symptoms. No melena, hematochezia, hematuria, or epstaxis. No claudication.  ROS: A comprehensive was performed. Review of Systems  Constitutional: Negative for malaise/fatigue.  HENT: Negative for congestion and nosebleeds.   Respiratory: Negative for cough, shortness of breath and wheezing.   Gastrointestinal: Negative for abdominal pain and heartburn.  Musculoskeletal: Negative for falls and joint pain.  Neurological: Negative for dizziness.  Psychiatric/Behavioral: Negative for depression and memory loss. The patient is not nervous/anxious and does not  have insomnia.   All other systems reviewed and are negative.   I have reviewed and (if needed) personally updated the patient's problem list, medications, allergies, past medical and surgical history, social and family history.   -- He indicates that he is hoping to retire in January at which time he plans to move to [place]. We will try to get him in one more time in December before that move.  Past Medical History:  Diagnosis Date  . Allergic rhinitis   . Arthritis    "entire body" (02/19/2015)  . Asthma   . Asthmatic bronchitis   . CAD (coronary artery disease), native coronary artery 07/2004   FATIGUE - PVCs -- CATH -> MV CAD --> CABG X 4  . 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 CAD S/P percutaneous coronary angioplasty 04/2011; 02/20/2015   a) PCI- Cx-OM W/ 2 overlapping Resolute DES 3x84m & 3x19mstents (3.63m20m  PCI to RCA -  Promus DES 3x38m81m.263mm6m b) 6/'16: PCI of dRCA-rPAV Promus Premier DES 2.75X28 (3.0 mm)  . CAS (cerebral atherosclerosis)    CAROTID DOPPLER, 08/25/2011 - mildly abnormal  . Chronic back pain   . Diverticulosis   . Erectile dysfunction   . Essential hypertension   . Family hx of colon cancer   . GERD (gastroesophageal reflux disease)   . Gout   . Hemorrhoids   . Hyperlipidemia with target LDL less than 70   .  Kidney stones   . Myocardial infarction ([place]) 07/2009  . Polycythemia   . 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 Symptomatic PVCs   . Syncope    2D ECHO, 07/13/2009 - EF >55%, normal    Past Surgical History:  Procedure Laterality Date  . CARDIAC CATHETERIZATION  07/15/2004   CABG recommended; continue medical therapy  . CARDIAC CATHETERIZATION  04/26/2011   Patent RIMA-LAD, atretic LIMA-circumflex (previous) 100 and  occluded SVG to diagonal and SVG to PDA. LAD 80-90% eccentric stenosis at SP1 that has 70% stenosis. Circumflex: Large OM and another distal branch with 90-95% stenosis followed by lesion in the OM branch. RCA dominant 6% lesion proximally and several old lesions. PDA occluded 70-80% annular lesion in the proximal RCA. 3 of 4 grafts occlu  . CARDIAC CATHETERIZATION N/A 02/20/2015   Procedure: Left Heart Cath and Coronary Angiography;  Surgeon: Leonie Man, MD;  Location: [facility]: Widely patent p-mRCA DES with dRCA 50%-PAVG 80% --> PCI. Patent overlapping stents --mCx-OM. 99% pLAD - patent RIMA-dLAD.  Known CTO SVG-rPL, SVG-OM3, SVG-D1  . CATARACT EXTRACTION W/ INTRAOCULAR LENS  IMPLANT, BILATERAL Bilateral ~ 2006/2007  . CORONARY ANGIOPLASTY WITH STENT PLACEMENT  04/28/2011; 02/20/2015   PCI-Cx-OM 3 overlapping Resolute DES 3 mm x14m and 3x174m--> 3.56m37mPCI to RCA - Promus Element DES 3x38m18m> 3.256mm82m) dRCA-rPAV: Promus Premier DES 2.756mm 59m mm (3.0 mm)   . CORONARY ARTERY BYPASS GRAFT  07/23/2004   LIMA-Cx RIMA-LAD, SVG-diagonal, SVG-PDA  . TRANSTHORACIC ECHOCARDIOGRAM  02/18/2015    Mild concentric LVH, EF 55-60%, no RWMA, Gr 1 DD, aortic sclerosis without stenosis  . TRANSURETHRAL RESECTION OF PROSTATE  ~ 2002/2003    Cardiac Cath/PCI July 2016: Multivessel CAD: Widely patent stents in prox-mid RCA (Promus DES 3.0 mm x 38 mm - 325 mm) as well as mid Cx-OM (2 overlapping resolute DES 3.0 mm x 15 mm and 3.0 mm x 18 mm - 3.5 mm). Patent pedRIMA-LAD with occluded LIMA-D1, SVG-OM 3 and SVG-RPDA. The distal RCA 50% with posterior AV groove branch having 80% stenosis. --> PCI with Promus Premier DES 2.75 mm 20 mm (3.0 mm). Diagnostic Diagram      Post-Intervention Diagram        Current Meds  Medication Sig  . albuterol (PROVENTIL HFA;VENTOLIN HFA) 108 (90 BASE) MCG/ACT inhaler Inhale 1 puff into the lungs every 6 (six) hours as needed for wheezing or shortness of breath.  .  ALPRAZolam (XANAX) 0.5 MG tablet Take 1 tablet (0.5 mg total) by mouth daily as needed for anxiety. As needed  . aspirin EC 81 MG tablet Take 1 tablet (81 mg total) by mouth daily.  . carvedilol (COREG) 3.125 MG tablet TAKE 1/4 TABLET IN MORNING AND 1/2 TABLET IN THE EVENING  . ezetimibe-simvastatin (VYTORIN) 10-40 MG tablet Take 0.5 tablets by mouth daily.  . mometasone (ELOCON) 0.1 % cream Apply to affected area three times a day  . nitroGLYCERIN (NITROSTAT) 0.4 MG SL tablet Place 1 tablet (0.4 mg total) under the tongue every 5 (five) minutes as needed for chest pain.  . oxyCMarland KitchenDONE-acetaminophen (PERCOCET) 10-325 MG per tablet Take 1 tablet by mouth as needed.  . Testosterone 30 MG/ACT SOLN Apply 30 mg topically daily.  . [DISCONTINUED] chlorzoxazone (PARAFON) 500 MG tablet Take 500 mg by mouth as needed.    Current Facility-Administered Medications for the 03/23/17 encounter (Office Visit) with HardinLeonie ManMedication  .  0.9 %  sodium chloride infusion    Allergies  Allergen Reactions  . Meperidine Hcl   . Penicillins     Social History   Social History  . Marital status: Married    Spouse name: N/A  . Number of children: 3  . Years of education: N/A   Occupational History  . MANAGER [employer]   Social History Main Topics  . Smoking status: Former Smoker    Packs/day: 1.00    Years: 7.00    Types: Cigarettes    Quit date: 09/06/1963  . Smokeless tobacco: Never Used  . Alcohol use No  . Drug use: No  . Sexual activity: Not Asked     Comment: 02/20/2015 refuses to answer sexually active   Other Topics Concern  . None   Social History Narrative   He is made to patient of mine. He works for [employer], as a Engineer, manufacturing systems. He is a former smoker but quit in 1965 after 7 years. He does not drink alcohol.   He is very busy with work, and does not get routine exercise. He plans to work maybe one more year and then will retire. 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    family history includes Colon cancer in his brother; Heart disease in his brother; Heart disease (age of onset: 79) in his father; Hypertension (age of onset: 13) in his brother; Hypertension (age of onset: 29) in his mother; Stomach cancer in his sister; Stroke (age of onset: 60) in his mother.  Wt Readings from Last 3 Encounters:  03/23/17 193 lb 12.8 oz (87.9 kg)  11/09/16 191 lb (86.6 kg)  09/28/16 191 lb (86.6 kg)    PHYSICAL EXAM BP (!) 156/79   Pulse 86   Ht '5\' 8"'$  (1.727 m)   Wt 193 lb 12.8 oz (87.9 kg)   SpO2 93%   BMI 29.47 kg/m  General appearance: alert, cooperative, appears stated age, no distress. Borderline obese. Well-nourished, well-groomed HEENT: [place]/AT, EOMI, MMM, anicteric sclera Neck: no adenopathy, no carotid bruit and no JVD Lungs: clear to auscultation bilaterally, normal percussion bilaterally and non-labored Heart: regular rate and rhythm, S1 &S2 normal, no click, rub or gallop; nondisplaced PMI. Soft 1/6C-D SEM at RUSB--> rods. Abdomen: soft, non-tender; bowel sounds normal; no masses,  no organomegaly; no HJR Extremities: extremities normal, atraumatic, no cyanosis, or edema  Pulses: 2+ and symmetric;  Neurologic: Mental status: Alert & oriented x 3, thought content appropriate; non-focal exam.  Pleasant mood & affect.    Adult ECG Report n/a  Other studies Reviewed: Additional studies/ records that were reviewed today include:  Recent Labs:  Followed by PCP    ASSESSMENT / PLAN: Problem List Items Addressed This Visit    Bilateral lower extremity edema (Chronic)    Probably just end of the day with minimal symptoms now. Not requiring diuretic.      CAD (coronary artery disease) of artery bypass graft -atretic LIMA- circumflex, occluded SVG-RCA and SVG-diagonal. (Chronic)    All but the RIMA-LAD graft are occluded. He now has stents revascularizing his native RCA and  circumflex. All further catheterizations can be done via radial access.  On aspirin, beta blocker and Vytorin.      CAD S/P  PCI of Cx-OM w/ 2 Resolute DES 3.0 x 15 & 3.0 x 18 (3.50m); PCI-pRCA: Promus Element DES 3.0 x 38 (3.239m; dRCA-rPAV:  Promus Premier DES 2.75x28 (3 mm) - Primary (Chronic)    Doing remarkably well from a cardiac standpoint with no active angina symptoms. He basically is completely revascularized with the exception of the diagonal branch that is getting retrograde flow from the [place] graft to the LAD. His native circumflex and RCA have been revascularized.  No recurrent anginal symptoms. He has been very difficult to treat the medicine standpoint. Still on aspirin without Plavix - this must is been changed since I last saw him. He is far enough out now with this probably okay. He is on miniscule dose of carvedilol (but would not tolerate further dosing) Reluctant to take any other medications sinuses low-dose carvedilol and Vytorin.  Would not do stress test unless he has active symptoms.      Essential hypertension (Chronic)    This is the first time I actually seen him having elevated pressures usually they're in the 120 to 1:30 millimeters range at home. For now I think we'll just leave alone may need to consider titrating up medications.      Hyperlipidemia with target LDL less than 70 (Chronic)    Unfortunate woman. Monitored by PCP.  He indicated he was not instituted in taking any medications through his insurance company. He was interested in potentially trying the [place] try medication, but this never went any further than that.      PVC's (premature ventricular contractions) (Chronic)    Well-controlled now on current low-dose beta blocker. Usually this is a sign that he is having active anginal symptoms. PVCs are more consistent with his ischemic heart disease than anything else.       Other Visit Diagnoses    Screening for abdominal aortic aneurysm        Relevant Orders   AAA Duplex      Current medicines are reviewed at length with the patient today. (+/- concerns) n/a The following changes have been made: n/a  Patient Instructions  Medication Instructions: No changes   Procedures/Testing: Your physician has requested that you have an abdominal aorta duplex in December. During this test, an ultrasound is used to evaluate the aorta. Allow 30 minutes for this exam. Do not eat after midnight the day before and avoid carbonated beverages   Follow-Up: Your physician wants you to follow-up in: late December after the Abdominal Aorta. You will receive a reminder letter in the mail two months in advance. If you don't receive a letter, please call our office to schedule the follow-up appointment.    If you need a refill on your cardiac medications before your next appointment, please call your pharmacy.     Studies Ordered:   No orders of the defined types were placed in this encounter.     [name], M.D., M.S. Interventional Cardiologist   Pager # [phone] Phone # [phone] [address] [place],  [address]  ADDENDUM: Receive this fax after the patient left Recent Labs: 03/21/2017  Na+ 140, K+ 4.5, Cl- 102, HCO3- 26 , BUN 11, Cr 0.99, Glu 107, Ca2+ 9.0; AST 20, ALT, 20, AlkP 82,  CBC: W 8.5  , H/H 15.5/45.3, Plt 231  TC 164, TG 80, HDL 46, LDL 100* not at goal

## 2017-03-25 ENCOUNTER — Encounter: Payer: Self-pay | Admitting: Cardiology

## 2017-03-25 NOTE — Assessment & Plan Note (Signed)
Doing remarkably well from a cardiac standpoint with no active angina symptoms. He basically is completely revascularized with the exception of the diagonal branch that is getting retrograde flow from the [place] graft to the LAD. His native circumflex and RCA have been revascularized.  No recurrent anginal symptoms. He has been very difficult to treat the medicine standpoint. Still on aspirin without Plavix - this must is been changed since I last saw him. He is far enough out now with this probably okay. He is on miniscule dose of carvedilol (but would not tolerate further dosing) Reluctant to take any other medications sinuses low-dose carvedilol and Vytorin.  Would not do stress test unless he has active symptoms.

## 2017-03-25 NOTE — Assessment & Plan Note (Signed)
Unfortunate woman. Monitored by PCP.  He indicated he was not instituted in taking any medications through his insurance company. He was interested in potentially trying the [place] try medication, but this never went any further than that.

## 2017-03-25 NOTE — Assessment & Plan Note (Signed)
Probably just end of the day with minimal symptoms now. Not requiring diuretic.

## 2017-03-25 NOTE — Assessment & Plan Note (Signed)
All but the RIMA-LAD graft are occluded. He now has stents revascularizing his native RCA and circumflex. All further catheterizations can be done via radial access.  On aspirin, beta blocker and Vytorin.

## 2017-03-25 NOTE — Assessment & Plan Note (Signed)
This is the first time I actually seen him having elevated pressures usually they're in the 120 to 1:30 millimeters range at home. For now I think we'll just leave alone may need to consider titrating up medications.

## 2017-03-25 NOTE — Assessment & Plan Note (Signed)
Well-controlled now on current low-dose beta blocker. Usually this is a sign that he is having active anginal symptoms. PVCs are more consistent with his ischemic heart disease than anything else.

## 2017-04-21 ENCOUNTER — Telehealth: Payer: Self-pay | Admitting: Cardiology

## 2017-04-21 NOTE — Telephone Encounter (Signed)
Spoke with patient he requested that I fax him a copy of his labs from Dr Durene Cal office to his home office. Informed patient that I could not fax the labs but I would mail them to him. He voiced understanding.

## 2017-04-21 NOTE — Telephone Encounter (Signed)
New message   Pt wants a copy of his lab work faxed to his work office at [phone]. If any questions you can call pt.

## 2017-07-24 ENCOUNTER — Ambulatory Visit (HOSPITAL_COMMUNITY)
Admission: RE | Admit: 2017-07-24 | Discharge: 2017-07-24 | Disposition: A | Discharge status: Home / Self Care | Payer: 59 | Source: Ambulatory Visit | Attending: Internal Medicine | Admitting: Internal Medicine

## 2017-07-24 ENCOUNTER — Inpatient Hospital Stay (HOSPITAL_COMMUNITY): Admission: RE | Admit: 2017-07-24 | Payer: 59 | Source: Ambulatory Visit

## 2017-07-24 DIAGNOSIS — Z136 Encounter for screening for cardiovascular disorders: Secondary | ICD-10-CM | POA: Diagnosis not present

## 2017-07-31 ENCOUNTER — Encounter: Payer: Self-pay | Admitting: Cardiology

## 2017-07-31 ENCOUNTER — Ambulatory Visit (INDEPENDENT_AMBULATORY_CARE_PROVIDER_SITE_OTHER): Payer: 59 | Admitting: Cardiology

## 2017-07-31 VITALS — BP 126/70 | HR 83 | Ht 67.0 in | Wt 192.0 lb

## 2017-07-31 DIAGNOSIS — I10 Essential (primary) hypertension: Secondary | ICD-10-CM | POA: Diagnosis not present

## 2017-07-31 DIAGNOSIS — I2510 Atherosclerotic heart disease of native coronary artery without angina pectoris: Secondary | ICD-10-CM

## 2017-07-31 DIAGNOSIS — I493 Ventricular premature depolarization: Secondary | ICD-10-CM | POA: Diagnosis not present

## 2017-07-31 DIAGNOSIS — G4733 Obstructive sleep apnea (adult) (pediatric): Secondary | ICD-10-CM

## 2017-07-31 DIAGNOSIS — E785 Hyperlipidemia, unspecified: Secondary | ICD-10-CM

## 2017-07-31 DIAGNOSIS — Z9861 Coronary angioplasty status: Secondary | ICD-10-CM

## 2017-07-31 MED ORDER — NITROGLYCERIN 0.4 MG SL SUBL: 0.4000 mg | SUBLINGUAL_TABLET | SUBLINGUAL | 2 refills | Status: DC | PRN

## 2017-07-31 NOTE — Progress Notes (Signed)
PCP: [name], MD  Clinic Note: Chief Complaint  Patient presents with  . Follow-up    No complaint  . Coronary Artery Disease    History of CABG and now with significant PCI due to occluded grafts.  PCI of the native vessels (circumflex and RCA.    HPI: [name] is a 76 y.o. male with a PMH below who presents today for Six-month follow-up of CAD with prior history of CABG followed by PCI. Most recent PCI was in June 2016 where he had PCI of the posterior AV groove branch with a DES stent. Since then he has done very well with no active anginal symptoms.[name] was last seen on July 2018. He was doing well. In great spirits. No complaints at that time - mild DOE.  Recent Hospitalizations: None  Studies Reviewed:   AAA screen - no evidence of AAA. Or obstructive atherosclerotic Dz.  Interval History:  [name] presents today again doing well.  He really has no complaints.  He has some mild exertional dyspnea, but notes that he really is not doing that much.  He has yet to retire although the plan was for him to retire before the end of year, he is now planning on waiting until he turns 23. He basically controls his own regimen and is reluctant to change any medications.  He is taking a minor dose of Vytorin and even less carvedilol basically taking one half of a 3.25 mg tablet daily (not even twice daily).  If he takes any more, he says he feels lethargic, but this little amount controls his palpitations and does not let his blood pressures get too low. He did voluntarily restart his Vytorin, but was only able to tolerate half dose. He has not had any of his frequent palpitations and rapid heartbeat/exertional dyspnea that was his anginal equivalent.  No PND, orthopnea or edema. No lightheadedness, dizziness, syncope/near syncope or TIA or amaurosis fugax symptoms.  No claudication.  Still has not gotten into any kind of exercise routine, and still uses his work as an  excuse.  ROS: A comprehensive was performed.  Pertinent symptoms per HPI Review of Systems  Constitutional: Positive for weight loss. Negative for malaise/fatigue (Energy level seems to be better having started testosterone.  Levels are now in the low to mid range.).  HENT: Negative for nosebleeds.   Respiratory: Negative for shortness of breath and wheezing.        Pertinent as per HPI  Cardiovascular: Negative.        Per history of present illness  Gastrointestinal: Negative for blood in stool, constipation and nausea.  Genitourinary: Negative for hematuria.  Musculoskeletal: Negative for falls, joint pain and myalgias.  Neurological: Positive for tremors (some bad days) and headaches. Negative for dizziness and focal weakness.  Endo/Heme/Allergies: Positive for environmental allergies.  Psychiatric/Behavioral: Negative for memory loss. The patient is nervous/anxious (very rare). The patient does not have insomnia.   All other systems reviewed and are negative.  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 Allergic rhinitis   . Arthritis    "entire body" (02/19/2015)  . Asthma   . Asthmatic bronchitis   . CAD (coronary artery disease), native coronary artery 07/2004   FATIGUE - PVCs -- CATH -> MV CAD --> CABG X 4  . CAD of autologous vein bypass graft without angina 04/2011   Frequent PVCs & fatigue ->  MYOVIEW w/ inferior ischemic with concern for MV distribution --> CATH: only RIMA-LAD patent (LIMA atretic & SVGs occluded) --> Staged PCI  . CAD S/P percutaneous coronary angioplasty 04/2011; 02/20/2015   a) PCI- Cx-OM W/ 2 overlapping Resolute DES 3x41mm & 3x15mm stents (3.100mm),  PCI to RCA -  Promus DES 3x55mm (3.50mm);;; b) 6/'16: PCI of dRCA-rPAV Promus Premier DES 2.75X28 (3.0 mm)  . CAS (cerebral atherosclerosis)    CAROTID DOPPLER, 08/25/2011 - mildly abnormal  . Chronic back pain   . Diverticulosis   . Erectile dysfunction   .  Essential hypertension   . Family hx of colon cancer   . GERD (gastroesophageal reflux disease)   . Gout   . Hemorrhoids   . Hyperlipidemia with target LDL less than 70   . Kidney stones   . Myocardial infarction ([place]) 07/2009  . Polycythemia   . 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 Symptomatic PVCs   . Syncope    2D ECHO, 07/13/2009 - EF >55%, normal    Past Surgical History:  Procedure Laterality Date  . CARDIAC CATHETERIZATION  07/15/2004   CABG recommended; continue medical therapy  . CARDIAC CATHETERIZATION  04/26/2011   Patent RIMA-LAD, atretic LIMA-circumflex (previous) 100 and occluded SVG to diagonal and SVG to PDA. LAD 80-90% eccentric stenosis at SP1 that has 70% stenosis. Circumflex: Large OM and another distal branch with 90-95% stenosis followed by lesion in the OM branch. RCA dominant 6% lesion proximally and several old lesions. PDA occluded 70-80% annular lesion in the proximal RCA. 3 of 4 grafts occlu  . CARDIAC CATHETERIZATION N/A 02/20/2015   Procedure: Left Heart Cath and Coronary Angiography;  Surgeon: Leonie Man, MD;  Location: [facility]: Widely patent p-mRCA DES with dRCA 50%-PAVG 80% --> PCI. Patent overlapping stents --mCx-OM. 99% pLAD - patent RIMA-dLAD.  Known CTO SVG-rPL, SVG-OM3, SVG-D1  . CATARACT EXTRACTION W/ INTRAOCULAR LENS  IMPLANT, BILATERAL Bilateral ~ 2006/2007  . CORONARY ANGIOPLASTY WITH STENT PLACEMENT  04/28/2011; 02/20/2015   PCI-Cx-OM 3 overlapping Resolute DES 3 mm x51mm and 3x31mm --> 3.36mm; PCI to RCA - Promus Element DES 3x56mm --> 3.37mm;; b) dRCA-rPAV: Promus Premier DES 2.46mm X 28 mm (3.0 mm)   . CORONARY ARTERY BYPASS GRAFT  07/23/2004   LIMA-Cx RIMA-LAD, SVG-diagonal, SVG-PDA  . TRANSTHORACIC ECHOCARDIOGRAM  02/18/2015    Mild concentric LVH, EF 55-60%, no RWMA, Gr 1 DD, aortic sclerosis without stenosis  . TRANSURETHRAL RESECTION OF PROSTATE  ~ 2002/2003    Current Meds  Medication Sig  .  albuterol (PROVENTIL HFA;VENTOLIN HFA) 108 (90 BASE) MCG/ACT inhaler Inhale 1 puff into the lungs every 6 (six) hours as needed for wheezing or shortness of breath.  . ALPRAZolam (XANAX) 0.5 MG tablet Take 1 tablet (0.5 mg total) by mouth daily as needed for anxiety. As needed  . aspirin EC 81 MG tablet Take 1 tablet (81 mg total) by mouth daily.  . carvedilol (COREG) 3.125 MG tablet TAKE 1/4 TABLET IN MORNING AND 1/2 TABLET IN THE EVENING  . ezetimibe-simvastatin (VYTORIN) 10-40 MG tablet Take 0.5 tablets by mouth daily.  . mometasone (ELOCON) 0.1 % cream Apply to affected area three times a day  . nitroGLYCERIN (NITROSTAT) 0.4 MG SL tablet Place 1 tablet (0.4 mg total) under the tongue every 5 (five) minutes as needed for chest pain.  Marland Kitchen oxyCODONE-acetaminophen (PERCOCET) 10-325 MG per tablet Take 1 tablet by mouth as needed.  . Testosterone  30 MG/ACT SOLN Apply 30 mg topically daily.  . [DISCONTINUED] nitroGLYCERIN (NITROSTAT) 0.4 MG SL tablet Place 1 tablet (0.4 mg total) under the tongue every 5 (five) minutes as needed for chest pain.   Current Facility-Administered Medications for the 07/31/17 encounter (Office Visit) with Leonie Man, MD  Medication  . 0.9 %  sodium chloride infusion    Allergies  Allergen Reactions  . Meperidine Hcl   . Penicillins     Social History   Socioeconomic History  . Marital status: Married    Spouse name: None  . Number of children: 3  . Years of education: None  . Highest education level: None  Social Needs  . Financial resource strain: None  . Food insecurity - worry: None  . Food insecurity - inability: None  . Transportation needs - medical: None  . Transportation needs - non-medical: None  Occupational History  . Occupation: Best boy: [employer]  Tobacco Use  . Smoking status: Former Smoker    Packs/day: 1.00    Years: 7.00    Pack years: 7.00    Types: Cigarettes    Last attempt to quit: 09/06/1963    Years since  quitting: 53.9  . Smokeless tobacco: Never Used  Substance and Sexual Activity  . Alcohol use: No  . Drug use: No  . Sexual activity: None    Comment: 02/20/2015 refuses to answer sexually active  Other Topics Concern  . None  Social History Narrative   He is made to patient of mine. He works for [employer], as a Engineer, manufacturing systems. He is a former smoker but quit in 1965 after 7 years. He does not drink alcohol.   He is very busy with work, and does not get routine exercise. He plans to work maybe one more year and then will retire. 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    Family History  Problem Relation Age of Onset  . Heart disease Father 31  . Heart disease Brother   . Colon cancer Brother        and liver cancer  . Stroke Mother 65  . Hypertension Mother 55  . Stomach cancer Sister   . Hypertension Brother 21  . Esophageal cancer Neg Hx   . Rectal cancer Neg Hx      Wt Readings from Last 3 Encounters:  07/31/17 192 lb (87.1 kg)  03/23/17 193 lb 12.8 oz (87.9 kg)  11/09/16 191 lb (86.6 kg)    PHYSICAL EXAM BP 126/70   Pulse 83   Ht 5\' 7"  (1.702 m)   Wt 192 lb (87.1 kg)   SpO2 96%   BMI 30.07 kg/m   Physical Exam  Constitutional: He is oriented to person, place, and time. He appears well-developed and well-nourished. No distress.  HENT:  Head: Normocephalic and atraumatic.  Eyes: EOM are normal. Pupils are equal, round, and reactive to light. No scleral icterus.  Neck: Neck supple. No hepatojugular reflux and no JVD present. Carotid bruit is not present.  Cardiovascular: Normal rate, regular rhythm and intact distal pulses. Exam reveals gallop and S4. Exam reveals no friction rub.  Murmur heard. soft 1/6 c-d SEM @ [facility]  Pulmonary/Chest: Effort normal and breath sounds normal. No respiratory distress. He has no wheezes. He has no rales.  Abdominal: Soft. Bowel sounds are normal. He  exhibits no distension. There is no  tenderness.  Musculoskeletal: Normal range of motion. He exhibits no edema.  Neurological: He is alert and oriented to person, place, and time.  Skin: Skin is warm and dry. No rash noted. No erythema.  Psychiatric: He has a normal mood and affect. His behavior is normal. Judgment and thought content normal.  Nursing note and vitals reviewed.    Adult ECG Report Not checked  Other studies Reviewed: Additional studies/ records that were reviewed today include:   Recent Labs: 04/2017- was checked by PCP recently (scanned) TC 164, TG 88, HDL 46, LDL 100-- he had been on Lipitor 10 mg, but did not tolerate. Is now back on Vytorin.   ASSESSMENT / PLAN: Problem List Items Addressed This Visit    CAD S/P  PCI of Cx-OM w/ 2 Resolute DES 3.0 x 15 & 3.0 x 18 (3.42mm); PCI-pRCA: Promus Element DES 3.0 x 38 (3.45mm); dRCA-rPAV: Promus Premier DES 2.75x28 (3 mm) - Primary (Chronic)   Relevant Medications   nitroGLYCERIN (NITROSTAT) 0.4 MG SL tablet   Essential hypertension (Chronic)    Surprisingly well controlled with current minuscule dose of carvedilol.      Relevant Medications   nitroGLYCERIN (NITROSTAT) 0.4 MG SL tablet   Hyperlipidemia with target LDL less than 70 (Chronic)    Unfortunately, I do not have labs from recent check.  Had been checked by PCP. He probably refuses to take anything else besides the current dose of Vytorin. Not interested in taking other medications.      Relevant Medications   nitroGLYCERIN (NITROSTAT) 0.4 MG SL tablet   Obstructive sleep apnea (Chronic)    Tolerating CPAP without any issues.      PVC's (premature ventricular contractions) (Chronic)    Also well controlled with current minuscule dose of carvedilol.  Very rarely had it does get to take the additional dose.      Relevant Medications   nitroGLYCERIN (NITROSTAT) 0.4 MG SL tablet      Patient Instructions  Medication Instructions:  Your physician  recommends that you continue on your current medications as directed. Please refer to the Current Medication list given to you today.  Follow-Up: Your physician wants you to follow-up in: 6 months with Dr. Ellyn Hack.  You will receive a reminder letter in the mail two months in advance. If you don't receive a letter, please call our office to schedule the follow-up appointment.   Any Other Special Instructions Will Be Listed Below (If Applicable).     If you need a refill on your cardiac medications before your next appointment, please call your pharmacy.      Studies Ordered:   No orders of the defined types were placed in this encounter.  Follow-up in 6 months.   [name], M.D., M.S. Interventional Cardiologist   Pager # [phone] Phone # [phone] [address] [place], [place] [address]

## 2017-07-31 NOTE — Patient Instructions (Signed)
Medication Instructions:  Your physician recommends that you continue on your current medications as directed. Please refer to the Current Medication list given to you today.   Follow-Up: Your physician wants you to follow-up in: 6 months with Dr. Harding. You will receive a reminder letter in the mail two months in advance. If you don't receive a letter, please call our office to schedule the follow-up appointment.    Any Other Special Instructions Will Be Listed Below (If Applicable).     If you need a refill on your cardiac medications before your next appointment, please call your pharmacy.   

## 2017-08-02 ENCOUNTER — Encounter: Payer: Self-pay | Admitting: Cardiology

## 2017-08-02 NOTE — Assessment & Plan Note (Signed)
Also well controlled with current minuscule dose of carvedilol.  Very rarely had it does get to take the additional dose.

## 2017-08-02 NOTE — Assessment & Plan Note (Signed)
Tolerating CPAP without any issues.

## 2017-08-02 NOTE — Assessment & Plan Note (Signed)
Unfortunately, I do not have labs from recent check.  Had been checked by PCP. He probably refuses to take anything else besides the current dose of Vytorin. Not interested in taking other medications.

## 2017-08-02 NOTE — Assessment & Plan Note (Signed)
Surprisingly well controlled with current minuscule dose of carvedilol.

## 2017-08-10 ENCOUNTER — Other Ambulatory Visit (HOSPITAL_COMMUNITY): Payer: 59

## 2017-08-17 ENCOUNTER — Ambulatory Visit: Payer: 59 | Admitting: Cardiology

## 2017-08-21 ENCOUNTER — Ambulatory Visit (INDEPENDENT_AMBULATORY_CARE_PROVIDER_SITE_OTHER): Payer: 59 | Admitting: Otolaryngology

## 2017-08-21 DIAGNOSIS — H9313 Tinnitus, bilateral: Secondary | ICD-10-CM | POA: Diagnosis not present

## 2017-08-21 DIAGNOSIS — H903 Sensorineural hearing loss, bilateral: Secondary | ICD-10-CM | POA: Diagnosis not present

## 2017-12-19 ENCOUNTER — Encounter: Payer: Self-pay | Admitting: Cardiology

## 2017-12-19 ENCOUNTER — Ambulatory Visit (INDEPENDENT_AMBULATORY_CARE_PROVIDER_SITE_OTHER): Payer: 59 | Admitting: Cardiology

## 2017-12-19 VITALS — BP 134/74 | HR 75 | Ht 67.0 in | Wt 190.0 lb

## 2017-12-19 DIAGNOSIS — R600 Localized edema: Secondary | ICD-10-CM

## 2017-12-19 DIAGNOSIS — Z9861 Coronary angioplasty status: Secondary | ICD-10-CM | POA: Diagnosis not present

## 2017-12-19 DIAGNOSIS — I25708 Atherosclerosis of coronary artery bypass graft(s), unspecified, with other forms of angina pectoris: Secondary | ICD-10-CM | POA: Diagnosis not present

## 2017-12-19 DIAGNOSIS — I493 Ventricular premature depolarization: Secondary | ICD-10-CM

## 2017-12-19 DIAGNOSIS — G4733 Obstructive sleep apnea (adult) (pediatric): Secondary | ICD-10-CM

## 2017-12-19 DIAGNOSIS — I10 Essential (primary) hypertension: Secondary | ICD-10-CM | POA: Diagnosis not present

## 2017-12-19 DIAGNOSIS — I447 Left bundle-branch block, unspecified: Secondary | ICD-10-CM | POA: Insufficient documentation

## 2017-12-19 DIAGNOSIS — E785 Hyperlipidemia, unspecified: Secondary | ICD-10-CM

## 2017-12-19 DIAGNOSIS — I2510 Atherosclerotic heart disease of native coronary artery without angina pectoris: Secondary | ICD-10-CM

## 2017-12-19 NOTE — H&P (View-Only) (Signed)
PCP: [name], MD  Clinic Note: Chief Complaint  Patient presents with  . Follow-up  . Coronary Artery Disease    History of CABG then graft failure leading to native vessel angioplasty and stent placement.    HPI: [name] is a 77 y.o. male with a PMH below who presents today for Six-month follow-up of CAD with prior history of CABG followed by PCI. Most recent PCI was in June 2016 where he had PCI of the posterior AV groove branch with a DES stent. Since then he has done very well with no active anginal symptoms..  Lambert A Krahn was last seen on July 31 2017.   He was doing well. In great spirits. No complaints at that time - mild DOE.  Recent Hospitalizations: None  Studies Reviewed:   AAA screen - no evidence of AAA. Or obstructive atherosclerotic Dz.  Interval History:  [name] presents today still doing well.  He has been feeling occasional flip-flop type symptoms but no rapid irregular heartbeats and notable palpitations.  He still gets short of breath if he overexerts or at the end of a long day if he tries to do more than he should.  Otherwise, he really has not had any resting or exertional chest discomfort.  No resting dyspnea and only dyspnea if he tries having too fast or too hard. He is pretty much resigned that he needed to go back on a statin.  Was switched from Vytorin to Crestor 5 mg daily.  He seems to be tolerating it relatively well.  From a cardiac standpoint he is relatively stable.  No active angina or heart failure symptoms.  No frequent PVCs.  Blood pressures been relatively well controlled. He tells me that he is planning to go into retirement as of June 28, but they are probably not going to move be moving to [place].  As usual, he continues to titrate his own medications despite with recommendations of been.  He is now on less and less meds with minimal dose of carvedilol along with his Crestor.  He also takes his testosterone supplementation.     No major cardiac complaints: Cardiovascular ROS: no chest pain or dyspnea on exertion positive for - Mild exertional dyspnea negative for - edema, irregular heartbeat, loss of consciousness, murmur, paroxysmal nocturnal dyspnea, shortness of breath or No melena, hematochezia or hematuria.  No syncope/near syncope or TIA/amaurosis fugax.   ROS: A comprehensive was performed.  Pertinent symptoms per HPI Review of Systems  Constitutional: Positive for weight loss. Negative for malaise/fatigue (Energy level seems to be better having started testosterone.  Levels are now in the low to mid range.).  HENT: Negative for nosebleeds.   Respiratory: Negative for shortness of breath and wheezing.        Pertinent as per HPI  Cardiovascular: Positive for palpitations (Rare intermittent flip-flopping symptoms.).       Per history of present illness  Gastrointestinal: Negative for blood in stool, constipation and nausea.  Genitourinary: Negative for hematuria.  Musculoskeletal: Negative for falls, joint pain and myalgias.  Neurological: Positive for tremors (some bad days) and headaches. Negative for dizziness and focal weakness.  Endo/Heme/Allergies: Positive for environmental allergies.  Psychiatric/Behavioral: Negative for memory loss. The patient is nervous/anxious (very rare). The patient does not have insomnia.   All other systems reviewed and are negative.  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 Allergic rhinitis   .  Arthritis    "entire body" (02/19/2015)  . Asthma   . Asthmatic bronchitis   . CAD (coronary artery disease), native coronary artery 07/2004   FATIGUE - PVCs -- CATH -> MV CAD --> CABG X 4  . 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 CAD  S/P percutaneous coronary angioplasty 04/2011; 02/20/2015   a) PCI- Cx-OM W/ 2 overlapping Resolute DES 3x60mm & 3x13mm stents (3.26mm),  PCI to RCA -  Promus DES 3x21mm (3.81mm);;; b) 6/'16: PCI of dRCA-rPAV Promus Premier DES 2.75X28 (3.0 mm)  . CAS (cerebral atherosclerosis)    CAROTID DOPPLER, 08/25/2011 - mildly abnormal  . Chronic back pain   . Diverticulosis   . Erectile dysfunction   . Essential hypertension   . Family hx of colon cancer   . GERD (gastroesophageal reflux disease)   . Gout   . Hemorrhoids   . Hyperlipidemia with target LDL less than 70   . Kidney stones   . Myocardial infarction ([place]) 07/2009  . Polycythemia   . 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 Symptomatic PVCs   . Syncope    2D ECHO, 07/13/2009 - EF >55%, normal    Past Surgical History:  Procedure Laterality Date  . CARDIAC CATHETERIZATION  07/15/2004   CABG recommended; continue medical therapy  . CARDIAC CATHETERIZATION  04/26/2011   Patent RIMA-LAD, atretic LIMA-circumflex (previous) 100 and occluded SVG to diagonal and SVG to PDA. LAD 80-90% eccentric stenosis at SP1 that has 70% stenosis. Circumflex: Large OM and another distal branch with 90-95% stenosis followed by lesion in the OM branch. RCA dominant 6% lesion proximally and several old lesions. PDA occluded 70-80% annular lesion in the proximal RCA. 3 of 4 grafts occlu  . CARDIAC CATHETERIZATION N/A 02/20/2015   Procedure: Left Heart Cath and Coronary Angiography;  Surgeon: Leonie Man, MD;  Location: [facility]: Widely patent p-mRCA DES with dRCA 50%-PAVG 80% --> PCI. Patent overlapping stents --mCx-OM. 99% pLAD - patent RIMA-dLAD.  Known CTO SVG-rPL, SVG-OM3, SVG-D1  . CATARACT EXTRACTION W/ INTRAOCULAR LENS  IMPLANT, BILATERAL Bilateral ~ 2006/2007  . CORONARY ANGIOPLASTY WITH STENT PLACEMENT  04/28/2011; 02/20/2015   PCI-Cx-OM 3 overlapping Resolute DES 3 mm x22mm and 3x10mm --> 3.44mm; PCI to RCA - Promus  Element DES 3x48mm --> 3.26mm;; b) dRCA-rPAV: Promus Premier DES 2.65mm X 28 mm (3.0 mm)   . CORONARY ARTERY BYPASS GRAFT  07/23/2004   LIMA-Cx RIMA-LAD, SVG-diagonal, SVG-PDA  . TRANSTHORACIC ECHOCARDIOGRAM  02/18/2015    Mild concentric LVH, EF 55-60%, no RWMA, Gr 1 DD, aortic sclerosis without stenosis  . TRANSURETHRAL RESECTION OF PROSTATE  ~ 2002/2003    Current Meds  Medication Sig  . albuterol (PROVENTIL HFA;VENTOLIN HFA) 108 (90 BASE) MCG/ACT inhaler Inhale 1 puff into the lungs every 6 (six) hours as needed for wheezing or shortness of breath.  . ALPRAZolam (XANAX) 0.5 MG tablet Take 1 tablet (0.5 mg total) by mouth daily as needed for anxiety. As needed  . aspirin EC 81 MG tablet Take 1 tablet (81 mg total) by mouth daily.  . mometasone (ELOCON) 0.1 % cream Apply to affected area three times a day  . nitroGLYCERIN (NITROSTAT) 0.4 MG SL tablet Place 1 tablet (0.4 mg total) under the tongue  every 5 (five) minutes as needed for chest pain.  . rosuvastatin (CRESTOR) 5 MG tablet Take 5 mg by mouth daily.    Allergies  Allergen Reactions  . Meperidine Hcl   . Penicillins     Social History   Tobacco Use  . Smoking status: Former Smoker    Packs/day: 1.00    Years: 7.00    Pack years: 7.00    Types: Cigarettes    Last attempt to quit: 09/06/1963    Years since quitting: 54.3  . Smokeless tobacco: Never Used  Substance Use Topics  . Alcohol use: No  . Drug use: No   Social History   Social History Narrative   He is made to patient of mine. He works for [employer], as a Engineer, manufacturing systems. He is a former smoker but quit in 1965 after 7 years. He does not drink alcohol.   He is very busy with work, and does not get routine exercise. He plans to work maybe one more year and then will retire. 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     Family History  Problem Relation Age of Onset  . Heart disease  Father 40  . Heart disease Brother   . Colon cancer Brother        and liver cancer  . Stroke Mother 48  . Hypertension Mother 26  . Stomach cancer Sister   . Hypertension Brother 42  . Esophageal cancer Neg Hx   . Rectal cancer Neg Hx      Wt Readings from Last 3 Encounters:  12/19/17 190 lb (86.2 kg)  07/31/17 192 lb (87.1 kg)  03/23/17 193 lb 12.8 oz (87.9 kg)    PHYSICAL EXAM BP 134/74 (BP Location: Left Arm, Patient Position: Sitting, Cuff Size: Normal)   Pulse 75   Ht 5\' 7"  (1.702 m)   Wt 190 lb (86.2 kg)   BMI 29.76 kg/m    -- no real change. Physical Exam  Constitutional: He is oriented to person, place, and time. He appears well-developed and well-nourished. No distress.  HENT:  Head: Normocephalic and atraumatic.  Eyes: Pupils are equal, round, and reactive to light. EOM are normal. No scleral icterus.  Neck: Neck supple. No hepatojugular reflux and no JVD present. Carotid bruit is not present.  Cardiovascular: Normal rate, regular rhythm and intact distal pulses. Exam reveals gallop and S4. Exam reveals no friction rub.  Murmur heard. soft 1/6 c-d SEM @ [facility]  Pulmonary/Chest: Effort normal and breath sounds normal. No respiratory distress. He has no wheezes. He has no rales.  Abdominal: Soft. Bowel sounds are normal. He exhibits no distension. There is no tenderness.  Musculoskeletal: Normal range of motion. He exhibits no edema.  Neurological: He is alert and oriented to person, place, and time.  Skin: Skin is warm and dry. No rash noted. No erythema.  Psychiatric: He has a normal mood and affect. His behavior is normal. Judgment and thought content normal.  Nursing note and vitals reviewed.    Adult ECG Report Not checked  Other studies Reviewed: Additional studies/ records that were reviewed today include:  Recent Labs: February 2019- was checked by PCP recently (scanned)  total cholesterol 205, triglycerides 114, HDL 41, LDL 141. --He had stopped  Vytorin and just started Crestor.>  ASSESSMENT / PLAN: Problem List Items Addressed This Visit    PVC's (premature ventricular contractions) (Chronic)  Is is usually pretty well controlled with carvedilol, but he has had some breakthrough episodes.  Would be reluctant to further titrate any medications on him because he would not likely tolerate. Monitor and consider digoxin.      Relevant Medications   rosuvastatin (CRESTOR) 5 MG tablet   Other Relevant Orders   EKG 12-Lead (Completed)   ECHOCARDIOGRAM COMPLETE   Obstructive sleep apnea (Chronic)    Tolerating CPAP      Hyperlipidemia with target LDL less than 70 (Chronic)     currently on 5 of Crestor.  E labs just checked showed total cholesterol 205, triglycerides 114, HDL 41 and LDL up to 141.   I suspect he will need Zetia added to his Crestor.  We can discuss when he comes back to discuss the results of his echo.      Relevant Medications   rosuvastatin (CRESTOR) 5 MG tablet   Other Relevant Orders   Lipid panel   Essential hypertension (Chronic)    Pretty well controlled on minimal dose of carvedilol.      Relevant Medications   rosuvastatin (CRESTOR) 5 MG tablet   Complete left bundle branch block (LBBB)    This is initially new finding.  Previous EKGs have had some QRS widening with bundle branch block pattern, however no true LBBB noted until now.  There is a suggestion when he had abnormal EKG in the past, now complete left bundle noted.  No active symptoms.  Probably not acutely ischemic. Plan: Check new baseline echocardiogram (has been several years) with new onset left bundle branch to establish baseline.      Relevant Medications   rosuvastatin (CRESTOR) 5 MG tablet   Other Relevant Orders   EKG 12-Lead (Completed)   ECHOCARDIOGRAM COMPLETE   CAD S/P  PCI of Cx-OM w/ 2 Resolute DES 3.0 x 15 & 3.0 x 18 (3.37mm); PCI-pRCA: Promus Element DES 3.0 x 38 (3.52mm); dRCA-rPAV: Promus Premier DES 2.75x28 (3 mm) -  Primary (Chronic)    No anginal symptoms after PCI.  On minimal dose of beta-blocker for an angina otherwise only on statin and aspirin.  No longer on Plavix.  Continue to monitor.      Relevant Medications   rosuvastatin (CRESTOR) 5 MG tablet   Other Relevant Orders   EKG 12-Lead (Completed)   ECHOCARDIOGRAM COMPLETE   CAD (coronary artery disease) of artery bypass graft -atretic LIMA- circumflex, occluded SVG-RCA and SVG-diagonal. (Chronic)    All of his grafts with the exception of the LAD RIMA graft are occluded.  He now has significant stent to the RCA and circumflex.  No further angina since last PCI. He remains on aspirin, minimal dose beta-blocker and now on low-dose Crestor.  He has not had to take any additional beta-blocker because of palpitations. Continue to monitor.  Not checking surveillance stress tests unless symptoms occur.      Relevant Medications   rosuvastatin (CRESTOR) 5 MG tablet   Bilateral lower extremity edema (Chronic)    End of day edema we discussed foot elevation and considering support stockings.  He has not Started using them them yet.           Patient Instructions  No change with current medications    TEST Will schedule at [facility] has requested that you have an echocardiogram. Echocardiography is a painless test that uses sound waves to create images of your heart. It provides your doctor with information about  the size and shape of your heart and how well your heart's chambers and valves are working. This procedure takes approximately one hour. There are no restrictions for this procedure.  LABS IN June 2019 LIPIDS  DO NOT EAT OR DRINK THE MORNING OF THE TEST     Your physician recommends that you schedule a follow-up appointment in June 2019 [place].       Studies Ordered:   Orders Placed This Encounter  Procedures  . Lipid panel  . EKG 12-Lead  . ECHOCARDIOGRAM COMPLETE     Follow-up in 6 months.   [name], M.D., M.S. Interventional Cardiologist   Pager # [phone] Phone # [phone] [address] [place], [place] [address]

## 2017-12-19 NOTE — Patient Instructions (Signed)
No change with current medications    TEST Will schedule at [facility] has requested that you have an echocardiogram. Echocardiography is a painless test that uses sound waves to create images of your heart. It provides your doctor with information about the size and shape of your heart and how well your heart's chambers and valves are working. This procedure takes approximately one hour. There are no restrictions for this procedure.  LABS IN June 2019 LIPIDS  DO NOT EAT OR DRINK THE MORNING OF THE TEST     Your physician recommends that you schedule a follow-up appointment in June 2019 [place].

## 2017-12-19 NOTE — Progress Notes (Signed)
PCP: [name], MD  Clinic Note: Chief Complaint  Patient presents with  . Follow-up  . Coronary Artery Disease    History of CABG then graft failure leading to native vessel angioplasty and stent placement.    HPI: [name] is a 77 y.o. male with a PMH below who presents today for Six-month follow-up of CAD with prior history of CABG followed by PCI. Most recent PCI was in June 2016 where he had PCI of the posterior AV groove branch with a DES stent. Since then he has done very well with no active anginal symptoms..  [name] was last seen on July 31 2017.   He was doing well. In great spirits. No complaints at that time - mild DOE.  Recent Hospitalizations: None  Studies Reviewed:   AAA screen - no evidence of AAA. Or obstructive atherosclerotic Dz.  Interval History:  [name] presents today still doing well.  He has been feeling occasional flip-flop type symptoms but no rapid irregular heartbeats and notable palpitations.  He still gets short of breath if he overexerts or at the end of a long day if he tries to do more than he should.  Otherwise, he really has not had any resting or exertional chest discomfort.  No resting dyspnea and only dyspnea if he tries having too fast or too hard. He is pretty much resigned that he needed to go back on a statin.  Was switched from Vytorin to Crestor 5 mg daily.  He seems to be tolerating it relatively well.  From a cardiac standpoint he is relatively stable.  No active angina or heart failure symptoms.  No frequent PVCs.  Blood pressures been relatively well controlled. He tells me that he is planning to go into retirement as of June 28, but they are probably not going to move be moving to [place].  As usual, he continues to titrate his own medications despite with recommendations of been.  He is now on less and less meds with minimal dose of carvedilol along with his Crestor.  He also takes his testosterone supplementation.     No major cardiac complaints: Cardiovascular ROS: no chest pain or dyspnea on exertion positive for - Mild exertional dyspnea negative for - edema, irregular heartbeat, loss of consciousness, murmur, paroxysmal nocturnal dyspnea, shortness of breath or No melena, hematochezia or hematuria.  No syncope/near syncope or TIA/amaurosis fugax.   ROS: A comprehensive was performed.  Pertinent symptoms per HPI Review of Systems  Constitutional: Positive for weight loss. Negative for malaise/fatigue (Energy level seems to be better having started testosterone.  Levels are now in the low to mid range.).  HENT: Negative for nosebleeds.   Respiratory: Negative for shortness of breath and wheezing.        Pertinent as per HPI  Cardiovascular: Positive for palpitations (Rare intermittent flip-flopping symptoms.).       Per history of present illness  Gastrointestinal: Negative for blood in stool, constipation and nausea.  Genitourinary: Negative for hematuria.  Musculoskeletal: Negative for falls, joint pain and myalgias.  Neurological: Positive for tremors (some bad days) and headaches. Negative for dizziness and focal weakness.  Endo/Heme/Allergies: Positive for environmental allergies.  Psychiatric/Behavioral: Negative for memory loss. The patient is nervous/anxious (very rare). The patient does not have insomnia.   All other systems reviewed and are negative.  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 Allergic rhinitis   .  Arthritis    "entire body" (02/19/2015)  . Asthma   . Asthmatic bronchitis   . CAD (coronary artery disease), native coronary artery 07/2004   FATIGUE - PVCs -- CATH -> MV CAD --> CABG X 4  . 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 CAD  S/P percutaneous coronary angioplasty 04/2011; 02/20/2015   a) PCI- Cx-OM W/ 2 overlapping Resolute DES 3x18mm & 3x99mm stents (3.56mm),  PCI to RCA -  Promus DES 3x84mm (3.16mm);;; b) 6/'16: PCI of dRCA-rPAV Promus Premier DES 2.75X28 (3.0 mm)  . CAS (cerebral atherosclerosis)    CAROTID DOPPLER, 08/25/2011 - mildly abnormal  . Chronic back pain   . Diverticulosis   . Erectile dysfunction   . Essential hypertension   . Family hx of colon cancer   . GERD (gastroesophageal reflux disease)   . Gout   . Hemorrhoids   . Hyperlipidemia with target LDL less than 70   . Kidney stones   . Myocardial infarction ([place]) 07/2009  . Polycythemia   . 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 Symptomatic PVCs   . Syncope    2D ECHO, 07/13/2009 - EF >55%, normal    Past Surgical History:  Procedure Laterality Date  . CARDIAC CATHETERIZATION  07/15/2004   CABG recommended; continue medical therapy  . CARDIAC CATHETERIZATION  04/26/2011   Patent RIMA-LAD, atretic LIMA-circumflex (previous) 100 and occluded SVG to diagonal and SVG to PDA. LAD 80-90% eccentric stenosis at SP1 that has 70% stenosis. Circumflex: Large OM and another distal branch with 90-95% stenosis followed by lesion in the OM branch. RCA dominant 6% lesion proximally and several old lesions. PDA occluded 70-80% annular lesion in the proximal RCA. 3 of 4 grafts occlu  . CARDIAC CATHETERIZATION N/A 02/20/2015   Procedure: Left Heart Cath and Coronary Angiography;  Surgeon: Leonie Man, MD;  Location: [facility]: Widely patent p-mRCA DES with dRCA 50%-PAVG 80% --> PCI. Patent overlapping stents --mCx-OM. 99% pLAD - patent RIMA-dLAD.  Known CTO SVG-rPL, SVG-OM3, SVG-D1  . CATARACT EXTRACTION W/ INTRAOCULAR LENS  IMPLANT, BILATERAL Bilateral ~ 2006/2007  . CORONARY ANGIOPLASTY WITH STENT PLACEMENT  04/28/2011; 02/20/2015   PCI-Cx-OM 3 overlapping Resolute DES 3 mm x46mm and 3x74mm --> 3.33mm; PCI to RCA - Promus  Element DES 3x67mm --> 3.77mm;; b) dRCA-rPAV: Promus Premier DES 2.66mm X 28 mm (3.0 mm)   . CORONARY ARTERY BYPASS GRAFT  07/23/2004   LIMA-Cx RIMA-LAD, SVG-diagonal, SVG-PDA  . TRANSTHORACIC ECHOCARDIOGRAM  02/18/2015    Mild concentric LVH, EF 55-60%, no RWMA, Gr 1 DD, aortic sclerosis without stenosis  . TRANSURETHRAL RESECTION OF PROSTATE  ~ 2002/2003    Current Meds  Medication Sig  . albuterol (PROVENTIL HFA;VENTOLIN HFA) 108 (90 BASE) MCG/ACT inhaler Inhale 1 puff into the lungs every 6 (six) hours as needed for wheezing or shortness of breath.  . ALPRAZolam (XANAX) 0.5 MG tablet Take 1 tablet (0.5 mg total) by mouth daily as needed for anxiety. As needed  . aspirin EC 81 MG tablet Take 1 tablet (81 mg total) by mouth daily.  . mometasone (ELOCON) 0.1 % cream Apply to affected area three times a day  . nitroGLYCERIN (NITROSTAT) 0.4 MG SL tablet Place 1 tablet (0.4 mg total) under the tongue  every 5 (five) minutes as needed for chest pain.  . rosuvastatin (CRESTOR) 5 MG tablet Take 5 mg by mouth daily.    Allergies  Allergen Reactions  . Meperidine Hcl   . Penicillins     Social History   Tobacco Use  . Smoking status: Former Smoker    Packs/day: 1.00    Years: 7.00    Pack years: 7.00    Types: Cigarettes    Last attempt to quit: 09/06/1963    Years since quitting: 54.3  . Smokeless tobacco: Never Used  Substance Use Topics  . Alcohol use: No  . Drug use: No   Social History   Social History Narrative   He is made to patient of mine. He works for [employer], as a Engineer, manufacturing systems. He is a former smoker but quit in 1965 after 7 years. He does not drink alcohol.   He is very busy with work, and does not get routine exercise. He plans to work maybe one more year and then will retire. 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     Family History  Problem Relation Age of Onset  . Heart disease  Father 59  . Heart disease Brother   . Colon cancer Brother        and liver cancer  . Stroke Mother 8  . Hypertension Mother 86  . Stomach cancer Sister   . Hypertension Brother 24  . Esophageal cancer Neg Hx   . Rectal cancer Neg Hx      Wt Readings from Last 3 Encounters:  12/19/17 190 lb (86.2 kg)  07/31/17 192 lb (87.1 kg)  03/23/17 193 lb 12.8 oz (87.9 kg)    PHYSICAL EXAM BP 134/74 (BP Location: Left Arm, Patient Position: Sitting, Cuff Size: Normal)   Pulse 75   Ht 5\' 7"  (1.702 m)   Wt 190 lb (86.2 kg)   BMI 29.76 kg/m    -- no real change. Physical Exam  Constitutional: He is oriented to person, place, and time. He appears well-developed and well-nourished. No distress.  HENT:  Head: Normocephalic and atraumatic.  Eyes: Pupils are equal, round, and reactive to light. EOM are normal. No scleral icterus.  Neck: Neck supple. No hepatojugular reflux and no JVD present. Carotid bruit is not present.  Cardiovascular: Normal rate, regular rhythm and intact distal pulses. Exam reveals gallop and S4. Exam reveals no friction rub.  Murmur heard. soft 1/6 c-d SEM @ [facility]  Pulmonary/Chest: Effort normal and breath sounds normal. No respiratory distress. He has no wheezes. He has no rales.  Abdominal: Soft. Bowel sounds are normal. He exhibits no distension. There is no tenderness.  Musculoskeletal: Normal range of motion. He exhibits no edema.  Neurological: He is alert and oriented to person, place, and time.  Skin: Skin is warm and dry. No rash noted. No erythema.  Psychiatric: He has a normal mood and affect. His behavior is normal. Judgment and thought content normal.  Nursing note and vitals reviewed.    Adult ECG Report Not checked  Other studies Reviewed: Additional studies/ records that were reviewed today include:  Recent Labs: February 2019- was checked by PCP recently (scanned)  total cholesterol 205, triglycerides 114, HDL 41, LDL 141. --He had stopped  Vytorin and just started Crestor.>  ASSESSMENT / PLAN: Problem List Items Addressed This Visit    PVC's (premature ventricular contractions) (Chronic)  Is is usually pretty well controlled with carvedilol, but he has had some breakthrough episodes.  Would be reluctant to further titrate any medications on him because he would not likely tolerate. Monitor and consider digoxin.      Relevant Medications   rosuvastatin (CRESTOR) 5 MG tablet   Other Relevant Orders   EKG 12-Lead (Completed)   ECHOCARDIOGRAM COMPLETE   Obstructive sleep apnea (Chronic)    Tolerating CPAP      Hyperlipidemia with target LDL less than 70 (Chronic)     currently on 5 of Crestor.  E labs just checked showed total cholesterol 205, triglycerides 114, HDL 41 and LDL up to 141.   I suspect he will need Zetia added to his Crestor.  We can discuss when he comes back to discuss the results of his echo.      Relevant Medications   rosuvastatin (CRESTOR) 5 MG tablet   Other Relevant Orders   Lipid panel   Essential hypertension (Chronic)    Pretty well controlled on minimal dose of carvedilol.      Relevant Medications   rosuvastatin (CRESTOR) 5 MG tablet   Complete left bundle branch block (LBBB)    This is initially new finding.  Previous EKGs have had some QRS widening with bundle branch block pattern, however no true LBBB noted until now.  There is a suggestion when he had abnormal EKG in the past, now complete left bundle noted.  No active symptoms.  Probably not acutely ischemic. Plan: Check new baseline echocardiogram (has been several years) with new onset left bundle branch to establish baseline.      Relevant Medications   rosuvastatin (CRESTOR) 5 MG tablet   Other Relevant Orders   EKG 12-Lead (Completed)   ECHOCARDIOGRAM COMPLETE   CAD S/P  PCI of Cx-OM w/ 2 Resolute DES 3.0 x 15 & 3.0 x 18 (3.67mm); PCI-pRCA: Promus Element DES 3.0 x 38 (3.31mm); dRCA-rPAV: Promus Premier DES 2.75x28 (3 mm) -  Primary (Chronic)    No anginal symptoms after PCI.  On minimal dose of beta-blocker for an angina otherwise only on statin and aspirin.  No longer on Plavix.  Continue to monitor.      Relevant Medications   rosuvastatin (CRESTOR) 5 MG tablet   Other Relevant Orders   EKG 12-Lead (Completed)   ECHOCARDIOGRAM COMPLETE   CAD (coronary artery disease) of artery bypass graft -atretic LIMA- circumflex, occluded SVG-RCA and SVG-diagonal. (Chronic)    All of his grafts with the exception of the LAD RIMA graft are occluded.  He now has significant stent to the RCA and circumflex.  No further angina since last PCI. He remains on aspirin, minimal dose beta-blocker and now on low-dose Crestor.  He has not had to take any additional beta-blocker because of palpitations. Continue to monitor.  Not checking surveillance stress tests unless symptoms occur.      Relevant Medications   rosuvastatin (CRESTOR) 5 MG tablet   Bilateral lower extremity edema (Chronic)    End of day edema we discussed foot elevation and considering support stockings.  He has not Started using them them yet.           Patient Instructions  No change with current medications    TEST Will schedule at [facility] has requested that you have an echocardiogram. Echocardiography is a painless test that uses sound waves to create images of your heart. It provides your doctor with information about  the size and shape of your heart and how well your heart's chambers and valves are working. This procedure takes approximately one hour. There are no restrictions for this procedure.  LABS IN June 2019 LIPIDS  DO NOT EAT OR DRINK THE MORNING OF THE TEST     Your physician recommends that you schedule a follow-up appointment in June 2019 [place].       Studies Ordered:   Orders Placed This Encounter  Procedures  . Lipid panel  . EKG 12-Lead  . ECHOCARDIOGRAM COMPLETE     Follow-up in 6 months.   [name], M.D., M.S. Interventional Cardiologist   Pager # [phone] Phone # [phone] [address] [place], [place] [address]

## 2017-12-21 ENCOUNTER — Encounter: Payer: Self-pay | Admitting: Cardiology

## 2017-12-21 NOTE — Assessment & Plan Note (Signed)
Tolerating CPAP 

## 2017-12-21 NOTE — Assessment & Plan Note (Signed)
Pretty well controlled on minimal dose of carvedilol.

## 2017-12-21 NOTE — Assessment & Plan Note (Addendum)
currently on 5 of Crestor.  E labs just checked showed total cholesterol 205, triglycerides 114, HDL 41 and LDL up to 141.   I suspect he will need Zetia added to his Crestor.  We can discuss when he comes back to discuss the results of his echo.

## 2017-12-21 NOTE — Assessment & Plan Note (Signed)
All of his grafts with the exception of the LAD RIMA graft are occluded.  He now has significant stent to the RCA and circumflex.  No further angina since last PCI. He remains on aspirin, minimal dose beta-blocker and now on low-dose Crestor.  He has not had to take any additional beta-blocker because of palpitations. Continue to monitor.  Not checking surveillance stress tests unless symptoms occur.

## 2017-12-21 NOTE — Assessment & Plan Note (Signed)
End of day edema we discussed foot elevation and considering support stockings.  He has not Started using them them yet.

## 2017-12-21 NOTE — Assessment & Plan Note (Signed)
This is initially new finding.  Previous EKGs have had some QRS widening with bundle branch block pattern, however no true LBBB noted until now.  There is a suggestion when he had abnormal EKG in the past, now complete left bundle noted.  No active symptoms.  Probably not acutely ischemic. Plan: Check new baseline echocardiogram (has been several years) with new onset left bundle branch to establish baseline.

## 2017-12-21 NOTE — Assessment & Plan Note (Signed)
No anginal symptoms after PCI.  On minimal dose of beta-blocker for an angina otherwise only on statin and aspirin.  No longer on Plavix.  Continue to monitor.

## 2017-12-21 NOTE — Assessment & Plan Note (Addendum)
Is is usually pretty well controlled with carvedilol, but he has had some breakthrough episodes.  Would be reluctant to further titrate any medications on him because he would not likely tolerate. Monitor and consider digoxin.

## 2018-01-01 ENCOUNTER — Ambulatory Visit (HOSPITAL_COMMUNITY): Payer: 59 | Attending: Internal Medicine

## 2018-01-01 DIAGNOSIS — G4733 Obstructive sleep apnea (adult) (pediatric): Secondary | ICD-10-CM | POA: Insufficient documentation

## 2018-01-01 DIAGNOSIS — I447 Left bundle-branch block, unspecified: Secondary | ICD-10-CM | POA: Diagnosis not present

## 2018-01-01 DIAGNOSIS — R609 Edema, unspecified: Secondary | ICD-10-CM | POA: Diagnosis not present

## 2018-01-01 DIAGNOSIS — I10 Essential (primary) hypertension: Secondary | ICD-10-CM | POA: Diagnosis not present

## 2018-01-01 DIAGNOSIS — I2510 Atherosclerotic heart disease of native coronary artery without angina pectoris: Secondary | ICD-10-CM | POA: Insufficient documentation

## 2018-01-01 DIAGNOSIS — E785 Hyperlipidemia, unspecified: Secondary | ICD-10-CM | POA: Insufficient documentation

## 2018-01-01 DIAGNOSIS — Z9861 Coronary angioplasty status: Secondary | ICD-10-CM | POA: Diagnosis present

## 2018-01-01 DIAGNOSIS — R0600 Dyspnea, unspecified: Secondary | ICD-10-CM | POA: Insufficient documentation

## 2018-01-01 DIAGNOSIS — I739 Peripheral vascular disease, unspecified: Secondary | ICD-10-CM | POA: Diagnosis not present

## 2018-01-01 DIAGNOSIS — I493 Ventricular premature depolarization: Secondary | ICD-10-CM

## 2018-01-03 ENCOUNTER — Telehealth: Payer: Self-pay | Admitting: *Deleted

## 2018-01-03 DIAGNOSIS — I25708 Atherosclerosis of coronary artery bypass graft(s), unspecified, with other forms of angina pectoris: Secondary | ICD-10-CM

## 2018-01-03 DIAGNOSIS — I447 Left bundle-branch block, unspecified: Secondary | ICD-10-CM

## 2018-01-03 DIAGNOSIS — E785 Hyperlipidemia, unspecified: Secondary | ICD-10-CM

## 2018-01-03 DIAGNOSIS — Z01818 Encounter for other preprocedural examination: Secondary | ICD-10-CM

## 2018-01-03 DIAGNOSIS — D689 Coagulation defect, unspecified: Secondary | ICD-10-CM

## 2018-01-03 DIAGNOSIS — R931 Abnormal findings on diagnostic imaging of heart and coronary circulation: Secondary | ICD-10-CM

## 2018-01-03 NOTE — Telephone Encounter (Signed)
-----   Message from Leonie Man, MD sent at 01/01/2018  5:29 PM EDT ----- 2D echo results: -- Ejection fraction now looks to be ~ 35% with diffuse septal and lateral wall hypokinesis. --The question is whether this is a result of the bundle branch block or heart artery disease.  This is somewhat unexpected --I think we probably do need to consider an ischemic evaluation.  Probably [place].  [name], MD

## 2018-01-05 ENCOUNTER — Encounter: Payer: Self-pay | Admitting: Cardiology

## 2018-01-05 ENCOUNTER — Telehealth: Payer: Self-pay | Admitting: *Deleted

## 2018-01-05 ENCOUNTER — Other Ambulatory Visit: Payer: Self-pay | Admitting: *Deleted

## 2018-01-05 DIAGNOSIS — I420 Dilated cardiomyopathy: Secondary | ICD-10-CM

## 2018-01-05 DIAGNOSIS — I2510 Atherosclerotic heart disease of native coronary artery without angina pectoris: Secondary | ICD-10-CM

## 2018-01-05 DIAGNOSIS — Z9861 Coronary angioplasty status: Principal | ICD-10-CM

## 2018-01-05 DIAGNOSIS — Z01818 Encounter for other preprocedural examination: Secondary | ICD-10-CM

## 2018-01-05 DIAGNOSIS — I447 Left bundle-branch block, unspecified: Secondary | ICD-10-CM

## 2018-01-05 HISTORY — DX: Dilated cardiomyopathy: I42.0

## 2018-01-05 NOTE — Telephone Encounter (Signed)
LATE ENTRY ,  DR HARDING SPOKE TO PATIENT ON 01/03/18 @ 6 PM.  PER DR HARDING PATIENT NEDDS A RIGHT AND LEFT HEART CATH . PATIENT RECEIVED INSTRUCTION IN REGARDS TO PROCEDURE . THE TIME AND DAY WILL BE GIVEN TO LATER DUE TO CATH LAB SCHEDULER CLOSED FOR THE DAY

## 2018-01-05 NOTE — Telephone Encounter (Signed)
SPOKE TO PATIENT.  DATE AND TIME GIVEN TO PATIENT FOR  01/09/18 TO  BE AT HOSPITAL 11:30 AM FOR RIGHT AND LEFT CARDIAC CATH   MAY HAVE CLEAR LIQUID UNTIL 8 AM THAT DAY.  PATIENT WILL HAVE LAB WORK DONE  TODAY.   PATIENT VERBALIZED UNDERSTANDING.

## 2018-01-06 LAB — COMPREHENSIVE METABOLIC PANEL
ALK PHOS: 84 IU/L (ref 39–117)
ALT: 19 IU/L (ref 0–44)
AST: 19 IU/L (ref 0–40)
Albumin/Globulin Ratio: 2.4 — ABNORMAL HIGH (ref 1.2–2.2)
Albumin: 4.5 g/dL (ref 3.5–4.8)
BILIRUBIN TOTAL: 0.5 mg/dL (ref 0.0–1.2)
BUN/Creatinine Ratio: 11 (ref 10–24)
BUN: 9 mg/dL (ref 8–27)
CHLORIDE: 99 mmol/L (ref 96–106)
CO2: 28 mmol/L (ref 20–29)
Calcium: 9.6 mg/dL (ref 8.6–10.2)
Creatinine, Ser: 0.85 mg/dL (ref 0.76–1.27)
GFR calc Af Amer: 96 mL/min/{1.73_m2} (ref >59)
GFR calc non Af Amer: 83 mL/min/{1.73_m2} (ref >59)
GLUCOSE: 107 mg/dL — AB (ref 65–99)
Globulin, Total: 1.9 g/dL (ref 1.5–4.5)
POTASSIUM: 4.6 mmol/L (ref 3.5–5.2)
Sodium: 140 mmol/L (ref 134–144)
Total Protein: 6.4 g/dL (ref 6.0–8.5)

## 2018-01-06 LAB — CBC
HEMATOCRIT: 48.3 % (ref 37.5–51.0)
HEMOGLOBIN: 16.4 g/dL (ref 13.0–17.7)
MCH: 28 pg (ref 26.6–33.0)
MCHC: 34 g/dL (ref 31.5–35.7)
MCV: 82 fL (ref 79–97)
Platelets: 242 10*3/uL (ref 150–379)
RBC: 5.86 x10E6/uL — AB (ref 4.14–5.80)
RDW: 14.8 % (ref 12.3–15.4)
WBC: 9.8 10*3/uL (ref 3.4–10.8)

## 2018-01-06 LAB — LIPID PANEL
CHOLESTEROL TOTAL: 173 mg/dL (ref 100–199)
Chol/HDL Ratio: 3.5 ratio (ref 0.0–5.0)
HDL: 50 mg/dL (ref >39)
LDL Calculated: 94 mg/dL (ref 0–99)
Triglycerides: 144 mg/dL (ref 0–149)
VLDL Cholesterol Cal: 29 mg/dL (ref 5–40)

## 2018-01-06 LAB — PROTIME-INR
INR: 1 (ref 0.8–1.2)
PROTHROMBIN TIME: 10.5 s (ref 9.1–12.0)

## 2018-01-08 ENCOUNTER — Telehealth: Payer: Self-pay | Admitting: *Deleted

## 2018-01-08 NOTE — Telephone Encounter (Signed)
Pt contacted pre-catheterization scheduled at [facility] for: Tuesday May 7,2019 1:30PM Verified arrival time and place: [place] Entrance A at: 11:30 AM  No solid food after midnight prior to cath, clear liquids until 5 AM day of procedure Verified allergies in Epic Verified diabetes medications.   AM meds can be  taken pre-cath with sip of water including: ASA 81 mg  Confirmed patient has responsible person to drive home post procedure and observe patient for 24 hours: yes

## 2018-01-09 ENCOUNTER — Ambulatory Visit (HOSPITAL_COMMUNITY)
Admission: RE | Admit: 2018-01-09 | Discharge: 2018-01-09 | Disposition: A | Discharge status: Home / Self Care | Payer: 59 | Source: Ambulatory Visit | Attending: Cardiology | Admitting: Cardiology

## 2018-01-09 ENCOUNTER — Ambulatory Visit (HOSPITAL_COMMUNITY)
Admission: RE | Disposition: A | Discharge status: Home / Self Care | Payer: Self-pay | Source: Ambulatory Visit | Attending: Cardiology

## 2018-01-09 DIAGNOSIS — Z955 Presence of coronary angioplasty implant and graft: Secondary | ICD-10-CM | POA: Insufficient documentation

## 2018-01-09 DIAGNOSIS — Z880 Allergy status to penicillin: Secondary | ICD-10-CM | POA: Diagnosis not present

## 2018-01-09 DIAGNOSIS — Z01818 Encounter for other preprocedural examination: Secondary | ICD-10-CM

## 2018-01-09 DIAGNOSIS — I252 Old myocardial infarction: Secondary | ICD-10-CM | POA: Diagnosis not present

## 2018-01-09 DIAGNOSIS — R0609 Other forms of dyspnea: Secondary | ICD-10-CM | POA: Diagnosis present

## 2018-01-09 DIAGNOSIS — Z8249 Family history of ischemic heart disease and other diseases of the circulatory system: Secondary | ICD-10-CM | POA: Insufficient documentation

## 2018-01-09 DIAGNOSIS — I10 Essential (primary) hypertension: Secondary | ICD-10-CM | POA: Insufficient documentation

## 2018-01-09 DIAGNOSIS — J45909 Unspecified asthma, uncomplicated: Secondary | ICD-10-CM | POA: Insufficient documentation

## 2018-01-09 DIAGNOSIS — I2510 Atherosclerotic heart disease of native coronary artery without angina pectoris: Secondary | ICD-10-CM

## 2018-01-09 DIAGNOSIS — Z87891 Personal history of nicotine dependence: Secondary | ICD-10-CM | POA: Diagnosis not present

## 2018-01-09 DIAGNOSIS — G4733 Obstructive sleep apnea (adult) (pediatric): Secondary | ICD-10-CM | POA: Insufficient documentation

## 2018-01-09 DIAGNOSIS — I25810 Atherosclerosis of coronary artery bypass graft(s) without angina pectoris: Secondary | ICD-10-CM | POA: Diagnosis present

## 2018-01-09 DIAGNOSIS — E785 Hyperlipidemia, unspecified: Secondary | ICD-10-CM | POA: Diagnosis not present

## 2018-01-09 DIAGNOSIS — I420 Dilated cardiomyopathy: Secondary | ICD-10-CM | POA: Diagnosis not present

## 2018-01-09 DIAGNOSIS — Z79899 Other long term (current) drug therapy: Secondary | ICD-10-CM | POA: Insufficient documentation

## 2018-01-09 DIAGNOSIS — I25119 Atherosclerotic heart disease of native coronary artery with unspecified angina pectoris: Secondary | ICD-10-CM | POA: Insufficient documentation

## 2018-01-09 DIAGNOSIS — I447 Left bundle-branch block, unspecified: Secondary | ICD-10-CM | POA: Diagnosis not present

## 2018-01-09 DIAGNOSIS — Z9861 Coronary angioplasty status: Secondary | ICD-10-CM

## 2018-01-09 HISTORY — PX: RIGHT/LEFT HEART CATH AND CORONARY/GRAFT ANGIOGRAPHY: CATH118267

## 2018-01-09 SURGERY — RIGHT/LEFT HEART CATH AND CORONARY/GRAFT ANGIOGRAPHY
Anesthesia: LOCAL

## 2018-01-09 MED ORDER — LIDOCAINE HCL (PF) 1 % IJ SOLN
INTRAMUSCULAR | Status: DC | PRN
  Administered 2018-01-09: 5 mL

## 2018-01-09 MED ORDER — MIDAZOLAM HCL 2 MG/2ML IJ SOLN
INTRAMUSCULAR | Status: AC
  Filled 2018-01-09: qty 2

## 2018-01-09 MED ORDER — IOHEXOL 350 MG/ML SOLN
INTRAVENOUS | Status: DC | PRN
  Administered 2018-01-09: 75 mL

## 2018-01-09 MED ORDER — HEPARIN SODIUM (PORCINE) 1000 UNIT/ML IJ SOLN
INTRAMUSCULAR | Status: AC
  Filled 2018-01-09: qty 1

## 2018-01-09 MED ORDER — SODIUM CHLORIDE 0.9 % IV SOLN
INTRAVENOUS | Status: DC
  Administered 2018-01-09: 13:00:00 via INTRAVENOUS

## 2018-01-09 MED ORDER — FENTANYL CITRATE (PF) 100 MCG/2ML IJ SOLN
INTRAMUSCULAR | Status: DC | PRN
  Administered 2018-01-09: 25 ug via INTRAVENOUS

## 2018-01-09 MED ORDER — SODIUM CHLORIDE 0.9% FLUSH: 3.0000 mL | INTRAVENOUS | Status: DC | PRN

## 2018-01-09 MED ORDER — VERAPAMIL HCL 2.5 MG/ML IV SOLN
INTRAVENOUS | Status: DC | PRN
  Administered 2018-01-09: 10 mL via INTRA_ARTERIAL

## 2018-01-09 MED ORDER — MIDAZOLAM HCL 2 MG/2ML IJ SOLN
INTRAMUSCULAR | Status: DC | PRN
  Administered 2018-01-09: 1 mg via INTRAVENOUS

## 2018-01-09 MED ORDER — SODIUM CHLORIDE 0.9 % IV SOLN: 250.0000 mL | INTRAVENOUS | Status: DC | PRN

## 2018-01-09 MED ORDER — VERAPAMIL HCL 2.5 MG/ML IV SOLN
INTRAVENOUS | Status: AC
  Filled 2018-01-09: qty 2

## 2018-01-09 MED ORDER — FENTANYL CITRATE (PF) 100 MCG/2ML IJ SOLN
INTRAMUSCULAR | Status: AC
  Filled 2018-01-09: qty 2

## 2018-01-09 MED ORDER — HEPARIN (PORCINE) IN NACL 1000-0.9 UT/500ML-% IV SOLN
INTRAVENOUS | Status: AC
  Filled 2018-01-09: qty 1000

## 2018-01-09 MED ORDER — LIDOCAINE HCL (PF) 1 % IJ SOLN
INTRAMUSCULAR | Status: AC
  Filled 2018-01-09: qty 30

## 2018-01-09 MED ORDER — SODIUM CHLORIDE 0.9% FLUSH: 3.0000 mL | Freq: Two times a day (BID) | INTRAVENOUS | Status: DC

## 2018-01-09 MED ORDER — ACETAMINOPHEN 325 MG PO TABS: 650.0000 mg | ORAL_TABLET | ORAL | Status: DC | PRN

## 2018-01-09 MED ORDER — SODIUM CHLORIDE 0.9 % IV SOLN: INTRAVENOUS | Status: DC

## 2018-01-09 MED ORDER — ONDANSETRON HCL 4 MG/2ML IJ SOLN: 4.0000 mg | Freq: Four times a day (QID) | INTRAMUSCULAR | Status: DC | PRN

## 2018-01-09 MED ORDER — HEPARIN (PORCINE) IN NACL 2-0.9 UNITS/ML
INTRAMUSCULAR | Status: AC | PRN
  Administered 2018-01-09 (×2): 500 mL

## 2018-01-09 SURGICAL SUPPLY — 13 items
CATH BALLN WEDGE 5F 110CM (CATHETERS) ×1 IMPLANT
CATH INFINITI 5 FR IM (CATHETERS) ×1 IMPLANT
CATH OPTITORQUE TIG 4.0 5F (CATHETERS) ×1 IMPLANT
DEVICE RAD COMP TR BAND LRG (VASCULAR PRODUCTS) ×1 IMPLANT
GLIDESHEATH SLEND A-KIT 6F 22G (SHEATH) ×1 IMPLANT
GUIDEWIRE INQWIRE 1.5J.035X260 (WIRE) IMPLANT
INQWIRE 1.5J .035X260CM (WIRE) ×2
KIT HEART LEFT (KITS) ×2 IMPLANT
PACK CARDIAC CATHETERIZATION (CUSTOM PROCEDURE TRAY) ×2 IMPLANT
SHEATH RAIN 4/5FR (SHEATH) ×1 IMPLANT
TRANSDUCER W/STOPCOCK (MISCELLANEOUS) ×2 IMPLANT
TUBING CIL FLEX 10 FLL-RA (TUBING) ×2 IMPLANT
WIRE EMERALD 3MM-J .025X260CM (WIRE) ×1 IMPLANT

## 2018-01-09 NOTE — Brief Op Note (Signed)
   BRIEF RIGHT & LEFT HEART CATHETERIZATION  01/09/2018  3:44 PM  PATIENT:  Lucius A Masella  77 y.o. male   PRE-OPERATIVE DIAGNOSIS:  CAD - LBBB with Cardiomyopathy.  POST-OPERATIVE DIAGNOSIS:  Known multivessel native CAD with moderate proximal LAD disease that is then grafted after Diag 2  The stented segment in the circumflex is widely patent with ostial circumflex 30 to 40%.  The RCA has diffuse stenting in the proximal to mid vessel as well as distal into the RPA B.  All stents are widely patent.  The RPA be continues on is a very large posterolateral branch.  (The PDA is a small caliber vessel which also fills via collaterals from the distal LAD)  Known occlusion of SVG-RCA and SVG-OM  Widely patent RIMA-dLAD with retrograde flow filling up to the occlusion site proximally as well as antegrade flow filling down around the apex to the distal half of the PDA.  Cardiomyopathy noted with reduced EF not to the extent seen on heart cath  Normal Right Heart Cath Pressures with PCWP 12 mmHg, LVP-EDP: 140/6 mmHg - 14 mmHg.  RAP 2 mmHg, RVP-EDP 33/0 mmHg - 5 mmHg.  PROCEDURE:  Procedure(s): RIGHT/LEFT HEART CATH AND CORONARY/GRAFT ANGIOGRAPHY (N/A)   Right brachial access using Angiocath needle exchanged to 5 [place] sheath  5 French right heart cath advanced over a wire into the RA, RV, PA and PCWP position.  Right radial access, Seldinger technique using micropuncture Angiocath kit  TIG 4.0 catheter for LCA, RCA cineangiography as well as left ventricular hemodynamics and LV gram  IMA catheter for RIMA-LAD cineangiography  SURGEON:  Surgeon(s) and Role:    * Leonie Man, MD - Primary  ANESTHESIA:   local and IV sedation; 3 mL subcu lidocaine for both access sites.  1 mg Versed, 25 mcg fentanyl (sedation time 36 minutes)  EBL:  <50 mL  MEDICATIONS USED: Contrast 75 mL, 3 mg verapamil for radial cocktail  TR BAND: 1535 hrs., 15 mL air; brachial sheath pulled in the Cath Lab  with manual pressure held for hemostasis  DICTATION: .Note written in EPIC  PLAN OF CARE: Discharge to home after PACU  PATIENT DISPOSITION:  PACU - hemodynamically stable.   Recommend restarting carvedilol.  Will reassess in outpatient.   Delay start of Pharmacological VTE agent (>24hrs) due to surgical blood loss or risk of bleeding: not applicable   [name], M.D., M.S. Interventional Cardiologist   Pager # [phone] Phone # [phone] [address] [place], [place] [address]

## 2018-01-09 NOTE — Interval H&P Note (Signed)
History and Physical Interval Note:  01/09/2018 2:51 PM  [name]  has presented today for surgery, with the diagnosis of CAD-with new diagnosis of left bundle branch block and significantly reduced ejection fraction/cardiomyopathy Based on the significant drop in EF, and his noting exertional dyspnea, I felt it prudent to exclude an ischemic etiology.  He has had bad experiences with stress test in the past and after discussion during his wife's clinic visit, we decided to proceed with right and left heart catheterization as definitive evaluation.   The various methods of treatment have been discussed with the patient and family. After consideration of risks, benefits and other options for treatment, the patient has consented to  Procedure(s): RIGHT/LEFT HEART CATH AND CORONARY/GRAFT ANGIOGRAPHY (N/A) as a surgical intervention .  The patient's history has been reviewed, patient examined, no change in status, stable for surgery.  I have reviewed the patient's chart and labs.  Questions were answered to the patient's satisfaction.    Cath Lab Visit (complete for each Cath Lab visit)  Clinical Evaluation Leading to the Procedure:   ACS: No.  Non-ACS:    Anginal Classification: CCS II  Anti-ischemic medical therapy: Maximal Therapy (2 or more classes of medications) -maximal tolerated medications  Non-Invasive Test Results: Intermediate-risk stress test findings: cardiac mortality 1-3%/year -newly reduced EF with new left bundle branch block  Prior CABG: Previous CABG    [name]

## 2018-01-09 NOTE — Discharge Instructions (Signed)

## 2018-01-10 ENCOUNTER — Encounter (HOSPITAL_COMMUNITY): Payer: Self-pay | Admitting: Cardiology

## 2018-01-10 LAB — POCT I-STAT 3, ART BLOOD GAS (G3+)
Acid-base deficit: 1 mmol/L (ref 0.0–2.0)
BICARBONATE: 25.6 mmol/L (ref 20.0–28.0)
O2 Saturation: 89 %
PCO2 ART: 47.2 mmHg (ref 32.0–48.0)
PH ART: 7.341 — AB (ref 7.350–7.450)
PO2 ART: 61 mmHg — AB (ref 83.0–108.0)
TCO2: 27 mmol/L (ref 22–32)

## 2018-01-10 LAB — POCT I-STAT 3, VENOUS BLOOD GAS (G3P V)
Acid-base deficit: 1 mmol/L (ref 0.0–2.0)
BICARBONATE: 26.3 mmol/L (ref 20.0–28.0)
Bicarbonate: 26 mmol/L (ref 20.0–28.0)
O2 SAT: 72 %
O2 Saturation: 76 %
PCO2 VEN: 48.3 mmHg (ref 44.0–60.0)
PCO2 VEN: 49 mmHg (ref 44.0–60.0)
PH VEN: 7.333 (ref 7.250–7.430)
PO2 VEN: 41 mmHg (ref 32.0–45.0)
PO2 VEN: 43 mmHg (ref 32.0–45.0)
TCO2: 28 mmol/L (ref 22–32)
TCO2: 28 mmol/L (ref 22–32)
pH, Ven: 7.344 (ref 7.250–7.430)

## 2018-01-10 MED FILL — Heparin Sod (Porcine)-NaCl IV Soln 1000 Unit/500ML-0.9%: INTRAVENOUS | Qty: 1000 | Status: AC

## 2018-02-28 ENCOUNTER — Encounter: Payer: Self-pay | Admitting: Cardiology

## 2018-02-28 ENCOUNTER — Ambulatory Visit (INDEPENDENT_AMBULATORY_CARE_PROVIDER_SITE_OTHER): Payer: 59 | Admitting: Cardiology

## 2018-02-28 VITALS — BP 110/64 | HR 83 | Ht 67.5 in | Wt 193.0 lb

## 2018-02-28 DIAGNOSIS — R600 Localized edema: Secondary | ICD-10-CM

## 2018-02-28 DIAGNOSIS — I420 Dilated cardiomyopathy: Secondary | ICD-10-CM

## 2018-02-28 DIAGNOSIS — Z9861 Coronary angioplasty status: Secondary | ICD-10-CM | POA: Diagnosis not present

## 2018-02-28 DIAGNOSIS — I447 Left bundle-branch block, unspecified: Secondary | ICD-10-CM

## 2018-02-28 DIAGNOSIS — I2510 Atherosclerotic heart disease of native coronary artery without angina pectoris: Secondary | ICD-10-CM

## 2018-02-28 DIAGNOSIS — I493 Ventricular premature depolarization: Secondary | ICD-10-CM | POA: Diagnosis not present

## 2018-02-28 DIAGNOSIS — E785 Hyperlipidemia, unspecified: Secondary | ICD-10-CM

## 2018-02-28 DIAGNOSIS — I25708 Atherosclerosis of coronary artery bypass graft(s), unspecified, with other forms of angina pectoris: Secondary | ICD-10-CM

## 2018-02-28 MED ORDER — BISOPROLOL FUMARATE 5 MG PO TABS: 2.5000 mg | ORAL_TABLET | Freq: Every day | ORAL | 3 refills | Status: DC

## 2018-02-28 NOTE — Progress Notes (Signed)
PCP: [name], MD  Clinic Note: Chief Complaint  Patient presents with  . Post-op Follow-up    R&LHCP-G for new Dx LBBB & reduced LVEF on Echo  . Coronary Artery Disease    No angina  . Palpitations    PVCs - controlled  . Cardiomyopathy    ? related to LBBB    HPI: [name] is a 77 y.o. male with a PMH below who presents today for post-Cath f/u with PMH of CAD-CABG-PCI & now newly noted LBBB on Echo with reduced LVEF.   CABG 07/2004 (Sx = fatigue & PVCs) --> CATH -> MV CAD --> CABG X 4 (RIMA-LAD, LIMA-Diag, SVG-OM, SVG-RCA)  04/2011 - recurrent Sx -> Myoview + for Inf Ischemia --> Cath => Only RIMA-LAD patent --> DES PCI of Native Cx-OM (2 overlapping Promus DES) & Native RCA  Most recent PCI - June 2016 - DES PCI of the dRCA-RPAV. Since then he has done very well with no active anginal symptoms.  [name] was last seen on December 19, 2017 - noted that he was "doing well" but a bit less energetic & more DOE than usual.  EKG with LBBB --> Echo with EF reduced to ~30-35% with diffuse HK. --> scheduled for R&LHC with Cors & Graft Angio.  Recent Hospitalizations:  For Cath.  Studies Reviewed:   01/09/2018: R&LHC -POST-OPERATIVE DIAGNOSIS: Known occluded LIMA-Diag & SVG-RCA, SVG-OM.  Known multivessel native CAD with mod-severe pLAD disease that is then grafted (patent RIMA-LAD with retrograde flow to CTO site & antegrade wraparound LAD, distal 1/2 of PDA territory) after Diag 2. Patent Cx-OM stent (ost Cx mild). P-mRCA overlapping DES widely patent along with dRCA-RPAV stent.   Normal RHC #s: PCWP 12 mmHg, LVP-EDP: 140/6 mmHg - 14 mmHg. RAP 2 mmHg, RVP-EDP 33/0 mmHg - 5 mmHg.    Interval History:  [name] presents today actually feeling that bad.  He mostly notes that while trying to take the current dose of carvedilol, he just feels very fatigued.  He has been taking it only once a day, and has noted improved energy levels since doing so.  He really notes that the palpitations  have improved with carvedilol, but the energy is definitely worse. He does note that being on Crestor is much improved from Vytorin. Overall, besides the fatigue issues which is really his legs giving out on him, he has not had any anginal type symptoms.  Is not the same type of fatigue that he had before where he was a dyspnea type issue this is more of the leg strength issue. He has not had any PND, orthopnea with no edema.  No syncope/near syncope or TIA/amaurosis fugax.     He was excited to tell me that this is his last week of work, he plans to meet with his boss tomorrow (1 day early) so he does not have to work on Friday.  He thinks that having his last little bit of stress removed from his life potentially be beneficial.    No major cardiac complaints: Cardiovascular ROS: no chest pain or dyspnea on exertion positive for - mostly leg fatigue with exertion.   negative for - dyspnea on exertion, edema, irregular heartbeat, loss of consciousness, murmur, palpitations, paroxysmal nocturnal dyspnea, rapid heart rate, shortness of breath or No melena, hematochezia or hematuria.  No syncope/near syncope or TIA/amaurosis fugax.   ROS: A comprehensive was performed.  Pertinent symptoms per HPI Review of Systems  Constitutional: Negative  for malaise/fatigue (mostly leg fatigue.) and weight loss.  HENT: Negative for nosebleeds.   Respiratory: Negative for shortness of breath and wheezing.        Pertinent as per HPI  Cardiovascular: Negative for palpitations (Rare intermittent flip-flopping symptoms.).       Per history of present illness  Gastrointestinal: Negative for blood in stool, constipation and nausea.  Genitourinary: Negative for hematuria.  Musculoskeletal: Negative for falls, joint pain and myalgias.  Neurological: Positive for tremors (some bad days) and headaches. Negative for dizziness and focal weakness.  Endo/Heme/Allergies: Positive for environmental allergies.    Psychiatric/Behavioral: Negative for memory loss. The patient is nervous/anxious (very rare). The patient does not have insomnia.   All other systems reviewed and are negative.  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 Allergic rhinitis   . Arthritis    "entire body" (02/19/2015)  . Asthma   . Asthmatic bronchitis   . CAD of autologous vein bypass graft without angina 04/2011   Frequent PVCs & fatigue -> MYOVIEW w/ inferior ischemic with concern for MV distribution --> CATH: only RIMA-LAD patent (LIMA atretic & SVGs occluded) --> Staged PCI of Native RCA & Cx  . CAD S/P percutaneous coronary angioplasty 04/2011; 02/20/2015   a) PCI- Cx-OM W/ 2 overlapping Resolute DES 3x9mm & 3x37mm stents (3.69mm),  PCI to RCA -  Promus DES 3x62mm (3.24mm);;; b) 6/'16: PCI of dRCA-rPAV Promus Premier DES 2.75X28 (3.0 mm)  . CAS (cerebral atherosclerosis)    CAROTID DOPPLER, 08/25/2011 - mildly abnormal  . Chronic back pain   . Diverticulosis   . Erectile dysfunction   . Essential hypertension   . Family hx of colon cancer   . GERD (gastroesophageal reflux disease)   . Gout   . Hemorrhoids   . Hyperlipidemia with target LDL less than 70   . Kidney stones   . Myocardial infarction ([place]) 07/2009  . Polycythemia   . 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 Symptomatic PVCs   . Syncope    2D ECHO, 07/13/2009 - EF >55%, normal    Past Surgical History:  Procedure Laterality Date  . CARDIAC CATHETERIZATION  07/15/2004   CABG recommended; continue medical therapy  . CARDIAC CATHETERIZATION  04/26/2011   Patent RIMA-LAD, atretic LIMA-circumflex (previous) 100 and occluded SVG to diagonal and SVG to PDA. LAD 80-90% eccentric stenosis at SP1 that has 70% stenosis. Circumflex: Large OM and another distal branch with 90-95% stenosis followed by lesion in the OM  branch. RCA dominant 6% lesion proximally and several old lesions. PDA occluded 70-80% annular lesion in the proximal RCA. 3 of 4 grafts occlu  . CARDIAC CATHETERIZATION N/A 02/20/2015   Procedure: Left Heart Cath and Coronary Angiography;  Surgeon: Leonie Man, MD;  Location: [facility]: Widely patent p-mRCA DES with dRCA 50%-PAVG 80% --> PCI. Patent overlapping stents --mCx-OM. 99% pLAD - patent RIMA-dLAD.  Known CTO SVG-rPL, SVG-OM3, SVG-D1  . CATARACT EXTRACTION W/ INTRAOCULAR LENS  IMPLANT, BILATERAL Bilateral ~ 2006/2007  . CORONARY ANGIOPLASTY WITH STENT PLACEMENT  04/28/2011; 02/20/2015   PCI-Cx-OM 3 overlapping Resolute DES 3 mm x18mm and 3x77mm --> 3.39mm; PCI to RCA - Promus Element DES 3x51mm --> 3.76mm;; b) dRCA-rPAV: Promus Premier DES 2.71mm X 28 mm (3.0 mm)   . CORONARY ARTERY BYPASS GRAFT  07/23/2004   LIMA-Cx RIMA-LAD, SVG-diagonal, SVG-PDA  . RIGHT/LEFT HEART CATH AND CORONARY/GRAFT ANGIOGRAPHY N/A 01/09/2018   Procedure: RIGHT/LEFT HEART CATH AND CORONARY/GRAFT ANGIOGRAPHY;  Surgeon: Leonie Man, MD;; Known occluded LIMA-Diag & SVG-RCA, SVG-OM.  Mod-severe pLAD that is grafted after D1 - patent RIMA-LAD = wraparound LAD, distal 1/2 of PDA territory. Patent Cx-OM DES (ost Cx mild) & p-mRCA overlapping DES & dRCA-RPAV DES.  PCWP 12 mmHg, LVP-EDP: 140/6 - 14 mmH. RAP 2 mmHg. RVP-EDP 33/0 - 5  mmHg  . TRANSTHORACIC ECHOCARDIOGRAM  02/18/2015    Mild concentric LVH, EF 55-60%, no RWMA, Gr 1 DD, aortic sclerosis without stenosis  . TRANSURETHRAL RESECTION OF PROSTATE  ~ 2002/2003    Current Meds  Medication Sig  . acetaminophen (TYLENOL) 500 MG tablet Take 1,000 mg by mouth daily as needed for moderate pain.  Marland Kitchen ALPRAZolam (XANAX) 0.5 MG tablet Take 0.5 mg by mouth as directed.  Marland Kitchen aspirin EC 81 MG tablet Take 81 mg by mouth as directed.  . budesonide-formoterol (SYMBICORT) 80-4.5 MCG/ACT inhaler Inhale 2 puffs into the lungs every 4 (four) hours as needed (shortness of  breath).  . chlorzoxazone (PARAFON) 500 MG tablet Take 500 mg by mouth daily as needed for muscle spasms.  . mometasone (NASONEX) 50 MCG/ACT nasal spray Place 2 sprays into the nose daily as needed (congestion).  . nystatin-triamcinolone (MYCOLOG II) cream Apply 1 application topically daily as needed (yeast infections).  [name] Glycol-Propyl Glycol (SYSTANE OP) Place 1 drop into both eyes 2 (two) times daily.  . rosuvastatin (CRESTOR) 5 MG tablet Take 5 mg by mouth every evening.   . Testosterone 30 MG/ACT SOLN Apply 30 mg topically daily.  . [DISCONTINUED] carvedilol (COREG) 3.125 MG tablet Take 3.125 mg by mouth as directed.    Allergies  Allergen Reactions  . Meperidine Hcl     Cold sweats, dizziness   . Penicillins     Severe headaches Has patient had a PCN reaction causing immediate rash, facial/tongue/throat swelling, SOB or lightheadedness with hypotension: No Has patient had a PCN reaction causing severe rash involving mucus membranes or skin necrosis: No Has patient had a PCN reaction that required hospitalization: No Has patient had a PCN reaction occurring within the last 10 years: No If all of the above answers are "NO", then may proceed with Cephalosporin use.     Social History   Tobacco Use  . Smoking status: Former Smoker    Packs/day: 1.00    Years: 7.00    Pack years: 7.00    Types: Cigarettes    Last attempt to quit: 09/06/1963    Years since quitting: 54.5  . Smokeless tobacco: Never Used  Substance Use Topics  . Alcohol use: No  . Drug use: No   Social History   Social History Narrative   He is made to patient of mine. He works for [employer], as a Engineer, manufacturing systems. He is a former smoker but quit in 1965 after 7 years. He does not drink alcohol.   He is very busy with work, and does not get routine exercise. He plans to work maybe one more year and then will retire. He does note his work has a lot of stress involved, as each switching station  is responsible for thousand calls.     Family History  Problem Relation Age of Onset  . Heart disease Father 79  . Heart disease Brother   . Colon cancer Brother  and liver cancer  . Stroke Mother 41  . Hypertension Mother 69  . Stomach cancer Sister   . Hypertension Brother 89  . Esophageal cancer Neg Hx   . Rectal cancer Neg Hx      Wt Readings from Last 3 Encounters:  02/28/18 193 lb (87.5 kg)  01/09/18 185 lb (83.9 kg)  12/19/17 190 lb (86.2 kg)    PHYSICAL EXAM BP 110/64   Pulse 83   Ht 5' 7.5" (1.715 m)   Wt 193 lb (87.5 kg)   SpO2 94%   BMI 29.78 kg/m    -- no real change. Physical Exam  Constitutional: He is oriented to person, place, and time. He appears well-developed and well-nourished. No distress.  HENT:  Head: Normocephalic and atraumatic.  Neck: Neck supple. No hepatojugular reflux and no JVD present. Carotid bruit is not present.  Cardiovascular: Normal rate, regular rhythm and intact distal pulses. Exam reveals gallop and S4. Exam reveals no friction rub.  Murmur heard. soft 1/6 c-d SEM @ [facility]  Pulmonary/Chest: Effort normal and breath sounds normal. No respiratory distress. He has no wheezes. He has no rales.  Abdominal: Soft. Bowel sounds are normal. He exhibits no distension. There is no tenderness.  No HSM  Musculoskeletal: Normal range of motion. He exhibits no edema.  Neurological: He is alert and oriented to person, place, and time.  Psychiatric: He has a normal mood and affect. His behavior is normal. Judgment and thought content normal.  Nursing note and vitals reviewed.    Adult ECG Report Not checked  Other studies Reviewed: Additional studies/ records that were reviewed today include:  Recent Labs: Jan 05, 2018- was checked by PCP recently (scanned)  total cholesterol 173, triglycerides 144, HDL 50, LDL 94. -- just started Crestor.  ASSESSMENT / PLAN: Problem List Items Addressed This Visit    PVC's (premature ventricular  contractions) (Chronic)    Interestingly,These episodes are controlled with once daily carvedilol.  I am discussed with her daughter 24-hour medication that is somewhat less potent to avoid the leg fatigue. Plan: Change to bisoprolol.      Relevant Medications   aspirin EC 81 MG tablet   bisoprolol (ZEBETA) 5 MG tablet   Hyperlipidemia with target LDL less than 70 (Chronic)    Very responsive to low-dose Crestor, but not really able to tolerate more than 5 mg a day. Since there was a pretty significant drop having started it, we will wait at least until 1 year out for the change to reassess before deciding to use Zetia.  Especially with him having fatigue I am worried about there being misconstrued symptoms as intolerance.      Relevant Medications   aspirin EC 81 MG tablet   bisoprolol (ZEBETA) 5 MG tablet   Dilated cardiomyopathy (HCC) (Chronic)    There is clearly an ischemic  component to this, but the conversion to complete LBBB was probably not related to new ischemia based on his recent cath findings.  This would argue for combined ischemic and nonischemic etiology.  Unfortunately, he just is not able to tolerate much the way of any medications.  I want to convert him from carvedilol to bisoprolol for concerns of beta-blocker related fatigue. He is euvolemic on exam and really does not act as though he has heart failure. We will recheck echo following 6 months follow-up -to see if this is a transient or long-term concern.      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  Cardiac cath was stable with patent RIMA and stents.  This is nonischemic, and possibly just progression of conduction disease.  Question if this is also been etiology for reduced EF.  Plan, recheck EKG in 6 months after echocardiogram to reassess.      Relevant Medications   aspirin EC 81  MG tablet   bisoprolol (ZEBETA) 5 MG tablet   CAD S/P  PCI of Cx-OM w/ 2 Resolute DES 3.0 x 15 & 3.0 x 18 (3.54mm); PCI-pRCA: Promus Element DES 3.0 x 38 (3.38mm); dRCA-rPAV: Promus Premier DES 2.75x28 (3 mm) - Primary (Chronic)    He had atypical anginal Sx - but the current fatigue seems less c/w prior anginal equivalent & more related to Carvedilol Patent stents & RIMA graft on Cath -- arguing against ischemic reason for LBBB.  On ASA & off Plavix. Tolerating only low dose Crestor. Convert from Carvedilol to Bisoprolol low dose - try to titrate up to 2.5 mg daily.      Relevant Medications   aspirin EC 81 MG tablet   bisoprolol (ZEBETA) 5 MG tablet   CAD (coronary artery disease) of artery bypass graft -atretic LIMA- circumflex, occluded SVG-RCA and SVG-diagonal. (Chronic)    Now essentially dependent pRIMA-LAD and stents in the native RCA as well as circumflex.      Relevant Medications   aspirin EC 81 MG tablet   bisoprolol (ZEBETA) 5 MG tablet   Bilateral lower extremity edema (Chronic)    Actually this is not been much of an issue for him recently.  Continue to elevate his feet.  Not requiring diuretic.  He talks about using support stockings for travel.         Patient Instructions  MEDICATION CHANGES  STOP CARVEDILOL  ,AFTER 2 DAYS OF BEING OFF MEDICATIONS , STARTTAKING ZEBETA  1/4 OF 5 MG TABLET FOR 2 WEEKS AND THEN TRY TAKING A HALF OF 5 MG TABLET.   Your physician recommends that you schedule a follow-up appointment in [place].    If you need a refill on your cardiac medications before your next appointment, please call your pharmacy.   Studies Ordered:   No orders of the defined types were placed in this encounter.     [name], M.D., M.S. Interventional Cardiologist   Pager # [phone] Phone # [phone] [address] [place], [place] [address]

## 2018-02-28 NOTE — Patient Instructions (Addendum)
MEDICATION CHANGES  STOP CARVEDILOL  ,AFTER 2 DAYS OF BEING OFF MEDICATIONS , STARTTAKING ZEBETA  1/4 OF 5 MG TABLET FOR 2 WEEKS AND THEN TRY TAKING A HALF OF 5 MG TABLET.   Your physician recommends that you schedule a follow-up appointment in [place].    If you need a refill on your cardiac medications before your next appointment, please call your pharmacy.

## 2018-03-04 ENCOUNTER — Encounter: Payer: Self-pay | Admitting: Cardiology

## 2018-03-04 NOTE — Assessment & Plan Note (Signed)
Newly found back in April.  Cardiac cath was stable with patent RIMA and stents.  This is nonischemic, and possibly just progression of conduction disease.  Question if this is also been etiology for reduced EF.  Plan, recheck EKG in 6 months after echocardiogram to reassess.

## 2018-03-04 NOTE — Assessment & Plan Note (Signed)
Actually this is not been much of an issue for him recently.  Continue to elevate his feet.  Not requiring diuretic.  He talks about using support stockings for travel.

## 2018-03-04 NOTE — Assessment & Plan Note (Signed)
Now essentially dependent pRIMA-LAD and stents in the native RCA as well as circumflex.

## 2018-03-04 NOTE — Assessment & Plan Note (Signed)
Very responsive to low-dose Crestor, but not really able to tolerate more than 5 mg a day. Since there was a pretty significant drop having started it, we will wait at least until 1 year out for the change to reassess before deciding to use Zetia.  Especially with him having fatigue I am worried about there being misconstrued symptoms as intolerance.

## 2018-03-04 NOTE — Assessment & Plan Note (Signed)
He had atypical anginal Sx - but the current fatigue seems less c/w prior anginal equivalent & more related to Carvedilol Patent stents & RIMA graft on Cath -- arguing against ischemic reason for LBBB.  On ASA & off Plavix. Tolerating only low dose Crestor. Convert from Carvedilol to Bisoprolol low dose - try to titrate up to 2.5 mg daily.

## 2018-03-04 NOTE — Assessment & Plan Note (Addendum)
There is clearly an ischemic  component to this, but the conversion to complete LBBB was probably not related to new ischemia based on his recent cath findings.  This would argue for combined ischemic and nonischemic etiology.  Unfortunately, he just is not able to tolerate much the way of any medications.  I want to convert him from carvedilol to bisoprolol for concerns of beta-blocker related fatigue. He is euvolemic on exam and really does not act as though he has heart failure. We will recheck echo following 6 months follow-up -to see if this is a transient or long-term concern.

## 2018-03-04 NOTE — Assessment & Plan Note (Signed)
Interestingly,These episodes are controlled with once daily carvedilol.  I am discussed with her daughter 24-hour medication that is somewhat less potent to avoid the leg fatigue. Plan: Change to bisoprolol.

## 2018-03-20 DIAGNOSIS — M79671 Pain in right foot: Secondary | ICD-10-CM | POA: Diagnosis not present

## 2018-03-20 DIAGNOSIS — M79674 Pain in right toe(s): Secondary | ICD-10-CM | POA: Diagnosis not present

## 2018-03-20 DIAGNOSIS — L03031 Cellulitis of right toe: Secondary | ICD-10-CM | POA: Diagnosis not present

## 2018-03-20 DIAGNOSIS — L600 Ingrowing nail: Secondary | ICD-10-CM | POA: Diagnosis not present

## 2018-04-10 DIAGNOSIS — L600 Ingrowing nail: Secondary | ICD-10-CM | POA: Diagnosis not present

## 2018-04-10 DIAGNOSIS — B351 Tinea unguium: Secondary | ICD-10-CM | POA: Diagnosis not present

## 2018-04-10 DIAGNOSIS — M79672 Pain in left foot: Secondary | ICD-10-CM | POA: Diagnosis not present

## 2018-04-10 DIAGNOSIS — M79671 Pain in right foot: Secondary | ICD-10-CM | POA: Diagnosis not present

## 2018-04-18 ENCOUNTER — Telehealth: Payer: Self-pay

## 2018-04-18 NOTE — Telephone Encounter (Signed)
I will defer to Dr. Ellyn Hack as to whether he wants to prescribe this medication.

## 2018-04-18 NOTE — Telephone Encounter (Signed)
Patient came by the office. Stated he wanted to talk to [name] who was not in the office today. I asked how I could help him. He brought in his testosterone medication and stated this was what Dr.Harding needed to order for him, but we had to have the doctor call [phone] to give authorization. I advised that Dr.Harding was out of office, pt asked that I send a message back to him and [name] to make them aware, he is almost out of his medication.   Please advise.   Medication: Testosterone 30mg /17ml topical solution.

## 2018-04-27 NOTE — Telephone Encounter (Signed)
Spoke to patient.aware will work on authorization--  Patient aware doctor harding will refill x 1 . Patient wanted to do all labs at dr harding 's office.  RN informed he can still do that ,but it may mean getting  Lab request from other physician and bring to [facility] to be done AT [place]. patient verbalized understanding.

## 2018-05-09 ENCOUNTER — Other Ambulatory Visit: Payer: Self-pay | Admitting: *Deleted

## 2018-06-08 DIAGNOSIS — Z23 Encounter for immunization: Secondary | ICD-10-CM | POA: Diagnosis not present

## 2018-06-13 DIAGNOSIS — M545 Low back pain: Secondary | ICD-10-CM | POA: Diagnosis not present

## 2018-06-13 DIAGNOSIS — R972 Elevated prostate specific antigen [PSA]: Secondary | ICD-10-CM | POA: Diagnosis not present

## 2018-06-13 DIAGNOSIS — R799 Abnormal finding of blood chemistry, unspecified: Secondary | ICD-10-CM | POA: Diagnosis not present

## 2018-06-13 DIAGNOSIS — I259 Chronic ischemic heart disease, unspecified: Secondary | ICD-10-CM | POA: Diagnosis not present

## 2018-06-13 DIAGNOSIS — N400 Enlarged prostate without lower urinary tract symptoms: Secondary | ICD-10-CM | POA: Diagnosis not present

## 2018-06-13 DIAGNOSIS — I2510 Atherosclerotic heart disease of native coronary artery without angina pectoris: Secondary | ICD-10-CM | POA: Diagnosis not present

## 2018-06-13 DIAGNOSIS — F419 Anxiety disorder, unspecified: Secondary | ICD-10-CM | POA: Diagnosis not present

## 2018-06-13 DIAGNOSIS — K219 Gastro-esophageal reflux disease without esophagitis: Secondary | ICD-10-CM | POA: Diagnosis not present

## 2018-06-13 DIAGNOSIS — Z6829 Body mass index (BMI) 29.0-29.9, adult: Secondary | ICD-10-CM | POA: Diagnosis not present

## 2018-06-13 DIAGNOSIS — G250 Essential tremor: Secondary | ICD-10-CM | POA: Diagnosis not present

## 2018-06-13 DIAGNOSIS — K635 Polyp of colon: Secondary | ICD-10-CM | POA: Diagnosis not present

## 2018-06-13 DIAGNOSIS — E291 Testicular hypofunction: Secondary | ICD-10-CM | POA: Diagnosis not present

## 2018-06-15 DIAGNOSIS — G250 Essential tremor: Secondary | ICD-10-CM | POA: Diagnosis not present

## 2018-06-15 DIAGNOSIS — E291 Testicular hypofunction: Secondary | ICD-10-CM | POA: Diagnosis not present

## 2018-06-15 DIAGNOSIS — M545 Low back pain: Secondary | ICD-10-CM | POA: Diagnosis not present

## 2018-06-15 DIAGNOSIS — K635 Polyp of colon: Secondary | ICD-10-CM | POA: Diagnosis not present

## 2018-06-15 DIAGNOSIS — R972 Elevated prostate specific antigen [PSA]: Secondary | ICD-10-CM | POA: Diagnosis not present

## 2018-06-15 DIAGNOSIS — E782 Mixed hyperlipidemia: Secondary | ICD-10-CM | POA: Diagnosis not present

## 2018-06-15 DIAGNOSIS — G8929 Other chronic pain: Secondary | ICD-10-CM | POA: Diagnosis not present

## 2018-06-15 DIAGNOSIS — Z0000 Encounter for general adult medical examination without abnormal findings: Secondary | ICD-10-CM | POA: Diagnosis not present

## 2018-06-15 DIAGNOSIS — J45901 Unspecified asthma with (acute) exacerbation: Secondary | ICD-10-CM | POA: Diagnosis not present

## 2018-06-15 DIAGNOSIS — F419 Anxiety disorder, unspecified: Secondary | ICD-10-CM | POA: Diagnosis not present

## 2018-06-15 DIAGNOSIS — G4700 Insomnia, unspecified: Secondary | ICD-10-CM | POA: Diagnosis not present

## 2018-06-15 DIAGNOSIS — I2510 Atherosclerotic heart disease of native coronary artery without angina pectoris: Secondary | ICD-10-CM | POA: Diagnosis not present

## 2018-07-03 DIAGNOSIS — M79671 Pain in right foot: Secondary | ICD-10-CM | POA: Diagnosis not present

## 2018-07-03 DIAGNOSIS — M79672 Pain in left foot: Secondary | ICD-10-CM | POA: Diagnosis not present

## 2018-07-03 DIAGNOSIS — L600 Ingrowing nail: Secondary | ICD-10-CM | POA: Diagnosis not present

## 2018-07-03 DIAGNOSIS — B351 Tinea unguium: Secondary | ICD-10-CM | POA: Diagnosis not present

## 2018-08-01 DIAGNOSIS — J060 Acute laryngopharyngitis: Secondary | ICD-10-CM | POA: Diagnosis not present

## 2018-08-20 ENCOUNTER — Ambulatory Visit (INDEPENDENT_AMBULATORY_CARE_PROVIDER_SITE_OTHER): Payer: Medicare Other | Admitting: Cardiology

## 2018-08-20 ENCOUNTER — Encounter: Payer: Self-pay | Admitting: Cardiology

## 2018-08-20 VITALS — BP 125/72 | HR 69 | Ht 66.5 in | Wt 191.8 lb

## 2018-08-20 DIAGNOSIS — I10 Essential (primary) hypertension: Secondary | ICD-10-CM | POA: Diagnosis not present

## 2018-08-20 DIAGNOSIS — I420 Dilated cardiomyopathy: Secondary | ICD-10-CM | POA: Diagnosis not present

## 2018-08-20 DIAGNOSIS — E785 Hyperlipidemia, unspecified: Secondary | ICD-10-CM

## 2018-08-20 DIAGNOSIS — I447 Left bundle-branch block, unspecified: Secondary | ICD-10-CM | POA: Diagnosis not present

## 2018-08-20 DIAGNOSIS — R600 Localized edema: Secondary | ICD-10-CM

## 2018-08-20 DIAGNOSIS — Z9861 Coronary angioplasty status: Secondary | ICD-10-CM | POA: Diagnosis not present

## 2018-08-20 DIAGNOSIS — I2510 Atherosclerotic heart disease of native coronary artery without angina pectoris: Secondary | ICD-10-CM | POA: Diagnosis not present

## 2018-08-20 DIAGNOSIS — I493 Ventricular premature depolarization: Secondary | ICD-10-CM | POA: Diagnosis not present

## 2018-08-20 DIAGNOSIS — I951 Orthostatic hypotension: Secondary | ICD-10-CM | POA: Diagnosis not present

## 2018-08-20 DIAGNOSIS — I25708 Atherosclerosis of coronary artery bypass graft(s), unspecified, with other forms of angina pectoris: Secondary | ICD-10-CM | POA: Diagnosis not present

## 2018-08-20 MED ORDER — BISOPROLOL FUMARATE 5 MG PO TABS: ORAL_TABLET | ORAL | 3 refills | Status: DC

## 2018-08-20 MED ORDER — FUROSEMIDE 20 MG PO TABS: 20.0000 mg | ORAL_TABLET | ORAL | 0 refills | Status: DC | PRN

## 2018-08-20 NOTE — Patient Instructions (Signed)
Medication Instructions:  DECREASE bisoprolol to 1/4 tablet every other day for 1 week, then STOP Take furosemide (Lasix) 20 mg AS NEEDED daily for swelling  If you need a refill on your cardiac medications before your next appointment, please call your pharmacy.   Follow-Up: At [facility], you and your health needs are our priority.  As part of our continuing mission to provide you with exceptional heart care, we have created designated Provider Care Teams.  These Care Teams include your primary Cardiologist (physician) and Advanced Practice Providers (APPs -  Physician Assistants and Nurse Practitioners) who all work together to provide you with the care you need, when you need it. You will need a follow up appointment in 6 months.  Please call our office 2 months in advance to schedule this appointment.  You may see [name], MD or one of the following Advanced Practice Providers on your designated Care Team:   Rosaria Ferries, PA-C . Jory Sims, DNP, ANP

## 2018-08-20 NOTE — Progress Notes (Signed)
PCP: [name], MD  Clinic Note: Chief Complaint  Patient presents with  . Follow-up    6 months.  Mostly notes fatigue  . Coronary Artery Disease    No angina  . Cardiomyopathy    Minimal edema    HPI: [name] is a 77 y.o. male with a PMH of CAD-CABG and PCI along with new diagnosis of cardiomyopathy with LBBB presenting for 36-month follow-up.   CABG 07/2004 (Sx = fatigue & PVCs) --> CATH -> MV CAD --> CABG X 4 (RIMA-LAD, LIMA-Diag, SVG-OM, SVG-RCA)  04/2011 - recurrent Sx -> Myoview + for Inf Ischemia --> Cath => Only RIMA-LAD patent --> DES PCI of Native Cx-OM (2 overlapping Promus DES) & Native RCA  Most recent PCI - June 2016 - DES PCI of the dRCA-RPAV. Since then [name] has done very well with no active anginal symptoms.  Cardiomyopathy: April 2019 echo with EF of roughly 35%  -noted new LBBB diagnosis.  Relook Cath June 2019 - patent stents & RIMA-LAD.  EF estimated 35 to 45%.  Normal right heart cath numbers.  PCWP 12 mmHg.  LV EDP 14 mmHg  [name] was last seen in June 2019 -- post hospital  / Cath --this was to evaluate reduced EF on echo with newly diagnosed LBBB.  We had restarted carvedilol post-cath, but [name] did not tolerate it.  We stopped carvedilol and we started low-dose bisoprolol.  ([name] is currently taking one quarter tab daily --if [name] takes a half tab, [name] notes just extreme fatigue)  Recent Hospitalizations:    None  Studies Reviewed:   none   Interval History:  [name] presents today overall doing a whole lot better with the very low-dose of bisoprolol.  [name] still though has a lot of fatigue.  [name] just has to stop and rest whenever [name] does things for more than 10 to 15 minutes.  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 [name] says that [name] energy level is better with her lower dose of beta-blocker, but still just feels tired and lethargic.  Sleeping fine, except in fact sleeping  more than usual. [name] is not having [name] usual anginal equivalent symptoms of exertional dyspnea and significant palpitations.  In fact the palpitations are pretty much all but gone.  [name] does always feel a few skipped beats but nothing like [name] had in the past. [name] states that [name] hands and feet swell and [name] has some stomach bloating from swelling off and on, but has not had really any PND orthopnea.  [name] does have sleep on a couple pillows but is not really true with orthopnea. [name] has not had any syncope or near syncope, but [name] has had some dizziness that is orthostatic in nature.  Since I last saw me [name] was diagnosed with a tremor and is taking Xanax for it.  Hands & feet mild (L> R) swelling.  1 pillow. No PND/orthopnea.    No major cardiac complaints besides fatigue.: Cardiovascular ROS: no chest pain or dyspnea on exertion positive for - mostly leg fatigue with exertion.   negative for - dyspnea on exertion, edema, irregular heartbeat, loss of consciousness, murmur, palpitations, paroxysmal nocturnal dyspnea, rapid heart rate, shortness of breath or No melena, hematochezia or hematuria.  No syncope/near syncope or TIA/amaurosis fugax.   ROS: A comprehensive was performed.  Pertinent symptoms per HPI Review of  Systems  Constitutional: Positive for malaise/fatigue (mostly leg fatigue; but also just generalized lack of energy). Negative for weight loss.  HENT: Negative for nosebleeds.   Respiratory: Negative for shortness of breath and wheezing.        Pertinent as per HPI  Cardiovascular: Positive for leg swelling. Negative for palpitations (Rare intermittent flip-flopping symptoms.).       Per history of present illness  Gastrointestinal: Negative for blood in stool, constipation and nausea.  Genitourinary: Negative for hematuria.  Musculoskeletal: Negative for falls, joint pain and myalgias.  Neurological: Positive for tremors (some bad days) and headaches. Negative for dizziness and focal  weakness.  Endo/Heme/Allergies: Positive for environmental allergies.  Psychiatric/Behavioral: Negative for memory loss. The patient is nervous/anxious (very rare). The patient does not have insomnia.   All other systems reviewed and are negative.  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 Allergic rhinitis   . Arthritis    "entire body" (02/19/2015)  . Asthma   . Asthmatic bronchitis   . CAD of autologous vein bypass graft without angina 04/2011   Frequent PVCs & fatigue -> MYOVIEW w/ inferior ischemic with concern for MV distribution --> CATH: only RIMA-LAD patent (LIMA atretic & SVGs occluded) --> Staged PCI of Native RCA & Cx  . CAD S/P percutaneous coronary angioplasty 04/2011; 02/20/2015   a) PCI- Cx-OM W/ 2 overlapping Resolute DES 3x62mm & 3x13mm stents (3.6mm),  PCI to RCA -  Promus DES 3x6mm (3.63mm);;; b) 6/'16: PCI of dRCA-rPAV Promus Premier DES 2.75X28 (3.0 mm)  . CAS (cerebral atherosclerosis)    CAROTID DOPPLER, 08/25/2011 - mildly abnormal  . Chronic back pain   . Diverticulosis   . Erectile dysfunction   . Essential hypertension   . Family hx of colon cancer   . GERD (gastroesophageal reflux disease)   . Gout   . Hemorrhoids   . Hyperlipidemia with target LDL less than 70   . Kidney stones   . Myocardial infarction ([place]) 07/2009  . Polycythemia   . 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 Symptomatic PVCs   . Syncope    2D ECHO, 07/13/2009 - EF >55%, normal    Past Surgical History:  Procedure Laterality Date  . CARDIAC CATHETERIZATION  07/15/2004   CABG recommended; continue medical therapy  . CARDIAC CATHETERIZATION  04/26/2011   Patent RIMA-LAD, atretic LIMA-circumflex (previous) 100 and occluded SVG to diagonal and SVG to PDA. LAD 80-90% eccentric stenosis at SP1 that has 70% stenosis. Circumflex: Large OM and  another distal branch with 90-95% stenosis followed by lesion in the OM branch. RCA dominant 6% lesion proximally and several old lesions. PDA occluded 70-80% annular lesion in the proximal RCA. 3 of 4 grafts occlu  . CARDIAC CATHETERIZATION N/A 02/20/2015   Procedure: Left Heart Cath and Coronary Angiography;  Surgeon: Leonie Man, MD;  Location: [facility]: Widely patent p-mRCA DES with dRCA 50%-PAVG 80% --> PCI. Patent overlapping stents --mCx-OM. 99% pLAD - patent RIMA-dLAD.  Known CTO SVG-rPL, SVG-OM3, SVG-D1  . CATARACT EXTRACTION W/ INTRAOCULAR LENS  IMPLANT, BILATERAL Bilateral ~ 2006/2007  . CORONARY ANGIOPLASTY WITH STENT PLACEMENT  04/28/2011; 02/20/2015   PCI-Cx-OM 3 overlapping Resolute DES 3 mm x9mm and 3x64mm --> 3.37mm; PCI to RCA - Promus Element DES 3x14mm --> 3.55mm;; b) dRCA-rPAV: [employer]  DES 2.4mm X 28 mm (3.0 mm)   . CORONARY ARTERY BYPASS GRAFT  07/23/2004   LIMA-Cx RIMA-LAD, SVG-diagonal, SVG-PDA  . RIGHT/LEFT HEART CATH AND CORONARY/GRAFT ANGIOGRAPHY N/A 01/09/2018   Procedure: RIGHT/LEFT HEART CATH AND CORONARY/GRAFT ANGIOGRAPHY;  Surgeon: Leonie Man, MD;; Known occluded LIMA-Diag & SVG-RCA, SVG-OM.  Mod-severe pLAD that is grafted after D1 - patent RIMA-LAD = wraparound LAD, distal 1/2 of PDA territory. Patent Cx-OM DES (ost Cx mild) & p-mRCA overlapping DES & dRCA-RPAV DES.  PCWP 12 mmHg, LVP-EDP: 140/6 - 14 mmH. RAP 2 mmHg. RVP-EDP 33/0 - 5  mmHg  . TRANSTHORACIC ECHOCARDIOGRAM  02/18/2015    Mild concentric LVH, EF 55-60%, no RWMA, Gr 1 DD, aortic sclerosis without stenosis  . TRANSURETHRAL RESECTION OF PROSTATE  ~ 2002/2003     01/09/2018: [facility] -POST-OPERATIVE DIAGNOSIS: Known occluded LIMA-Diag & SVG-RCA, SVG-OM.  Known MV-CAD with mod-severe pLAD disease - grafted (patent RIMA-LAD with retrograde flow to CTO site & antegrade wraparound LAD, distal 1/2 of PDA territory) after Diag 2. Patent Cx-OM stent (ost Cx mild). P-mRCA overlapping DES widely  patent along with dRCA-RPAV stent.   Normal RHC #s: PCWP 12 mmHg, LVP-EDP: 140/6 mmHg - 14 mmHg. RAP 2 mmHg, RVP-EDP 33/0 mmHg - 5 mmHg.     Current Meds  Medication Sig  . acetaminophen (TYLENOL) 500 MG tablet Take 1,000 mg by mouth daily as needed for moderate pain.  Marland Kitchen ALPRAZolam (XANAX) 0.5 MG tablet Take 0.5 mg by mouth as directed.  Marland Kitchen aspirin EC 81 MG tablet Take 81 mg by mouth as directed.  . bisoprolol (ZEBETA) 5 MG tablet Take 1/4 tablet daily for 1 week, then STOP  . budesonide-formoterol (SYMBICORT) 80-4.5 MCG/ACT inhaler Inhale 2 puffs into the lungs every 4 (four) hours as needed (shortness of breath).  . chlorzoxazone (PARAFON) 500 MG tablet Take 500 mg by mouth daily as needed for muscle spasms.  . mometasone (NASONEX) 50 MCG/ACT nasal spray Place 2 sprays into the nose daily as needed (congestion).  . nystatin-triamcinolone (MYCOLOG II) cream Apply 1 application topically daily as needed (yeast infections).  Vladimir Faster Glycol-Propyl Glycol (SYSTANE OP) Place 1 drop into both eyes 2 (two) times daily.  . rosuvastatin (CRESTOR) 5 MG tablet Take 5 mg by mouth every evening.   . [DISCONTINUED] bisoprolol (ZEBETA) 5 MG tablet Take 0.5 tablets (2.5 mg total) by mouth daily.    Allergies  Allergen Reactions  . Meperidine Hcl     Cold sweats, dizziness   . Penicillins     Severe headaches Has patient had a PCN reaction causing immediate rash, facial/tongue/throat swelling, SOB or lightheadedness with hypotension: No Has patient had a PCN reaction causing severe rash involving mucus membranes or skin necrosis: No Has patient had a PCN reaction that required hospitalization: No Has patient had a PCN reaction occurring within the last 10 years: No If all of the above answers are "NO", then may proceed with Cephalosporin use.     Social History   Tobacco Use  . Smoking status: Former Smoker    Packs/day: 1.00    Years: 7.00    Pack years: 7.00    Types: Cigarettes    Last  attempt to quit: 09/06/1963    Years since quitting: 54.9  . Smokeless tobacco: Never Used  Substance Use Topics  . Alcohol use: No  . Drug use: No   Social History   Social History Narrative   [name] is made to patient of mine. 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 [name] is a former smoker but quit in 1965 after 7 years. [name] does not drink alcohol.   [name] is very busy with work, and does not get routine exercise. [name] plans to work maybe one more year and then will retire. 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     Family History  Problem Relation Age of Onset  . Heart disease Father 69  . Heart disease Brother   . Colon cancer Brother        and liver cancer  . Stroke Mother 26  . Hypertension Mother 71  . Stomach cancer Sister   . Hypertension Brother 38  . Esophageal cancer Neg Hx   . Rectal cancer Neg Hx      Wt Readings from Last 3 Encounters:  08/20/18 191 lb 12.8 oz (87 kg)  02/28/18 193 lb (87.5 kg)  01/09/18 185 lb (83.9 kg)    PHYSICAL EXAM BP 125/72   Pulse 69   Ht 5' 6.5" (1.689 m)   Wt 191 lb 12.8 oz (87 kg)   SpO2 95%   BMI 30.49 kg/m    -- no real change. Physical Exam  Constitutional: [name] is oriented to person, place, and time. [name] appears well-developed and well-nourished. No distress.  HENT:  Head: Normocephalic and atraumatic.  Neck: Neck supple. No hepatojugular reflux and no JVD present. Carotid bruit is not present.  Cardiovascular: Normal rate, regular rhythm and intact distal pulses. Exam reveals gallop and S4. Exam reveals no friction rub.  Murmur heard. soft 1/6 c-d SEM @ [facility]  Pulmonary/Chest: Effort normal and breath sounds normal. No respiratory distress. [name] has no wheezes. [name] has no rales.  Abdominal: Soft. Bowel sounds are normal. [name] exhibits no distension. There is no abdominal tenderness.  No HSM  Musculoskeletal: Normal range of motion.        General: No  edema (Trivial pedal edema.).  Neurological: [name] is alert and oriented to person, place, and time.  Psychiatric: [name] has a normal mood and affect. [name] behavior is normal. Judgment and thought content normal.  Nursing note and vitals reviewed.    Adult ECG Report Not checked  Other studies Reviewed: Additional studies/ records that were reviewed today include:  Recent Labs: October 9 , 2019- was checked by PCP recently (scanned)  total cholesterol 182, triglycerides 82, HDL 50, LDL 94. --On low-dose Crestor.  ASSESSMENT / PLAN: Problem List Items Addressed This Visit    Bilateral lower extremity edema (Chronic)    [name] wife seems to be more worried about this than [name] is, but with [name] hands abdomen and legs intermittently swelling, will prescribe as needed Lasix 20 mg daily. Also recommended support stockings.      CAD (coronary artery disease) of artery bypass graft -atretic LIMA- circumflex, occluded SVG-RCA and SVG-diagonal. - Primary (Chronic)    Only remaining graft is the RIMA-LAD.  [name] has stents in the previously grafted native RCA and circumflex.      Relevant Medications   furosemide (LASIX) 20 MG tablet   bisoprolol (ZEBETA) 5 MG tablet   CAD S/P  PCI of Cx-OM w/ 2 Resolute DES 3.0 x 15 & 3.0 x 18 (3.36mm); PCI-pRCA: Promus Element DES 3.0 x 38 (3.56mm); dRCA-rPAV: Promus Premier DES 2.75x28 (3 mm) (Chronic)    Most recent cath showed widely patent stents in the mid and distal RCA-RPL  as well as circumflex.  [name] is pretty much intolerant of beta-blocker at this point so [name] had to stop. [name] is on low-dose Crestor which [name] can barely tolerate.  Not interested in other treatment options. [name] has very unusual symptoms from angina standpoint and can usually associated with PVCs and exertional dyspnea.  [name] is not having any those symptoms.  Hopefully if we stop the beta-blocker they will not recur.  [name] is on aspirin alone.  No longer on Plavix.      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  I suspect that this could be related to [name] proximal LAD disease, but nothing that we can do about from a PCI standpoint.  Patent RIMA.  I suspect that this may be the etiology for [name] reduced EF since there is no other reason that I can explain.  If [name] EF is still down on recheck echo which would be next year follow-up), would then need to consider the possibility of ICD and possibly BiV ICD (either CRT or CRT-D).      Relevant Medications   furosemide (LASIX) 20 MG tablet   bisoprolol (ZEBETA) 5 MG tablet   Dilated cardiomyopathy (HCC) (Chronic)    While there may be an ischemic component, [name] coronaries are stable from before.  Probably has some to do with LBBB. Since were not able to put [name] on any medications, I am leery of really rechecking an echo.  However, I do think we should probably check one after [name] next visit.  If it still is reduced, may need to consider CRT or CRT-D.      Relevant Medications   furosemide (LASIX) 20 MG tablet   bisoprolol (ZEBETA) 5 MG tablet   Essential hypertension (Chronic)    Relatively well controlled, in fact has had some orthostatic symptoms.  We can stop [name] beta-blocker to allow for more energy and improved blood pressure.      Relevant Medications   furosemide (LASIX) 20 MG tablet   bisoprolol (ZEBETA) 5 MG tablet   Hyperlipidemia with target LDL less than 70 (Chronic)    LDL is better being on Crestor, but is not able to tolerate any further.  [name] is reluctant to try anything like Zetia or non-statin medications.  Talked about referring [name] to [facility] ([place]) to discuss other options.      Relevant Medications   furosemide (LASIX) 20 MG tablet   bisoprolol (ZEBETA) 5 MG tablet   Orthostatic hypotension - with tremor    Orthostatic hypotension with tremor is  concerning for neurocardiogenic hypotension.  Cannot take away all of [name] blood pressure medications and reassess.  Would be reluctant to use midodrine unless [name] has profound symptoms. Not yet at the level to consider Northera.      Relevant Medications   furosemide (LASIX) 20 MG tablet   bisoprolol (ZEBETA) 5 MG tablet   PVC's (premature ventricular contractions) (Chronic)    Well-controlled.  Probably more ischemic in nature.  Hopefully will not recur once we stop [name] beta-blocker.      Relevant Medications   furosemide (LASIX) 20 MG tablet   bisoprolol (ZEBETA) 5 MG tablet      Patient Instructions  Medication Instructions:  DECREASE bisoprolol to 1/4 tablet every other day for 1 week, then STOP Take furosemide (Lasix)  20 mg AS NEEDED daily for swelling  If you need a refill on your cardiac medications before your next appointment, please call your pharmacy.   Follow-Up: At [facility], you and your health needs are our priority.  As part of our continuing mission to provide you with exceptional heart care, we have created designated Provider Care Teams.  These Care Teams include your primary Cardiologist (physician) and Advanced Practice Providers (APPs -  Physician Assistants and Nurse Practitioners) who all work together to provide you with the care you need, when you need it. You will need a follow up appointment in 6 months.  Please call our office 2 months in advance to schedule this appointment.  You may see [name], MD or one of the following Advanced Practice Providers on your designated Care Team:   Rosaria Ferries, PA-C . Jory Sims, DNP, ANP    Studies Ordered:   No orders of the defined types were placed in this encounter.     [name], M.D., M.S. Interventional Cardiologist   Pager # [phone] Phone # [phone] [address] [place], [place] [address]

## 2018-08-21 ENCOUNTER — Encounter: Payer: Self-pay | Admitting: Cardiology

## 2018-08-21 NOTE — Assessment & Plan Note (Signed)
LDL is better being on Crestor, but is not able to tolerate any further.  He is reluctant to try anything like Zetia or non-statin medications.  Talked about referring him to [facility] ([place]) to discuss other options.

## 2018-08-21 NOTE — Assessment & Plan Note (Signed)
Most recent cath showed widely patent stents in the mid and distal RCA-RPL as well as circumflex.  He is pretty much intolerant of beta-blocker at this point so he had to stop. He is on low-dose Crestor which he can barely tolerate.  Not interested in other treatment options. He has very unusual symptoms from angina standpoint and can usually associated with PVCs and exertional dyspnea.  He is not having any those symptoms.  Hopefully if we stop the beta-blocker they will not recur.  He is on aspirin alone.  No longer on Plavix.

## 2018-08-21 NOTE — Assessment & Plan Note (Signed)
Relatively new diagnosis.  I suspect that this could be related to his proximal LAD disease, but nothing that we can do about from a PCI standpoint.  Patent RIMA.  I suspect that this may be the etiology for his reduced EF since there is no other reason that I can explain.  If his EF is still down on recheck echo which would be next year follow-up), would then need to consider the possibility of ICD and possibly BiV ICD (either CRT or CRT-D).

## 2018-08-21 NOTE — Assessment & Plan Note (Signed)
While there may be an ischemic component, his coronaries are stable from before.  Probably has some to do with LBBB. Since were not able to put him on any medications, I am leery of really rechecking an echo.  However, I do think we should probably check one after his next visit.  If it still is reduced, may need to consider CRT or CRT-D.

## 2018-08-21 NOTE — Assessment & Plan Note (Addendum)
His wife seems to be more worried about this than he is, but with his hands abdomen and legs intermittently swelling, will prescribe as needed Lasix 20 mg daily. Also recommended support stockings.

## 2018-08-21 NOTE — Assessment & Plan Note (Signed)
Orthostatic hypotension with tremor is concerning for neurocardiogenic hypotension.  Cannot take away all of his blood pressure medications and reassess.  Would be reluctant to use midodrine unless he has profound symptoms. Not yet at the level to consider Northera.

## 2018-08-21 NOTE — Assessment & Plan Note (Signed)
Only remaining graft is the RIMA-LAD.  He has stents in the previously grafted native RCA and circumflex.

## 2018-08-21 NOTE — Assessment & Plan Note (Signed)
Well-controlled.  Probably more ischemic in nature.  Hopefully will not recur once we stop his beta-blocker.

## 2018-08-21 NOTE — Assessment & Plan Note (Signed)
Relatively well controlled, in fact has had some orthostatic symptoms.  We can stop his beta-blocker to allow for more energy and improved blood pressure.

## 2018-09-03 ENCOUNTER — Other Ambulatory Visit: Payer: Self-pay | Admitting: Cardiology

## 2019-04-03 DIAGNOSIS — E782 Mixed hyperlipidemia: Secondary | ICD-10-CM | POA: Diagnosis not present

## 2019-04-03 DIAGNOSIS — G8929 Other chronic pain: Secondary | ICD-10-CM | POA: Diagnosis not present

## 2019-04-03 DIAGNOSIS — N400 Enlarged prostate without lower urinary tract symptoms: Secondary | ICD-10-CM | POA: Diagnosis not present

## 2019-04-03 DIAGNOSIS — G4700 Insomnia, unspecified: Secondary | ICD-10-CM | POA: Diagnosis not present

## 2019-04-03 DIAGNOSIS — R799 Abnormal finding of blood chemistry, unspecified: Secondary | ICD-10-CM | POA: Diagnosis not present

## 2019-04-03 DIAGNOSIS — M545 Low back pain: Secondary | ICD-10-CM | POA: Diagnosis not present

## 2019-04-03 DIAGNOSIS — E875 Hyperkalemia: Secondary | ICD-10-CM | POA: Diagnosis not present

## 2019-04-03 DIAGNOSIS — R972 Elevated prostate specific antigen [PSA]: Secondary | ICD-10-CM | POA: Diagnosis not present

## 2019-04-03 DIAGNOSIS — I2510 Atherosclerotic heart disease of native coronary artery without angina pectoris: Secondary | ICD-10-CM | POA: Diagnosis not present

## 2019-04-03 DIAGNOSIS — K219 Gastro-esophageal reflux disease without esophagitis: Secondary | ICD-10-CM | POA: Diagnosis not present

## 2019-04-03 DIAGNOSIS — E291 Testicular hypofunction: Secondary | ICD-10-CM | POA: Diagnosis not present

## 2019-04-03 DIAGNOSIS — I259 Chronic ischemic heart disease, unspecified: Secondary | ICD-10-CM | POA: Diagnosis not present

## 2019-04-09 DIAGNOSIS — E291 Testicular hypofunction: Secondary | ICD-10-CM | POA: Diagnosis not present

## 2019-04-09 DIAGNOSIS — G250 Essential tremor: Secondary | ICD-10-CM | POA: Diagnosis not present

## 2019-04-09 DIAGNOSIS — G8929 Other chronic pain: Secondary | ICD-10-CM | POA: Diagnosis not present

## 2019-04-09 DIAGNOSIS — I2510 Atherosclerotic heart disease of native coronary artery without angina pectoris: Secondary | ICD-10-CM | POA: Diagnosis not present

## 2019-04-09 DIAGNOSIS — E782 Mixed hyperlipidemia: Secondary | ICD-10-CM | POA: Diagnosis not present

## 2019-04-09 DIAGNOSIS — I420 Dilated cardiomyopathy: Secondary | ICD-10-CM | POA: Diagnosis not present

## 2019-04-09 DIAGNOSIS — M545 Low back pain: Secondary | ICD-10-CM | POA: Diagnosis not present

## 2019-04-09 DIAGNOSIS — R7301 Impaired fasting glucose: Secondary | ICD-10-CM | POA: Diagnosis not present

## 2019-04-09 DIAGNOSIS — R001 Bradycardia, unspecified: Secondary | ICD-10-CM | POA: Diagnosis not present

## 2019-04-09 DIAGNOSIS — R600 Localized edema: Secondary | ICD-10-CM | POA: Diagnosis not present

## 2019-04-09 DIAGNOSIS — K635 Polyp of colon: Secondary | ICD-10-CM | POA: Diagnosis not present

## 2019-04-09 DIAGNOSIS — R972 Elevated prostate specific antigen [PSA]: Secondary | ICD-10-CM | POA: Diagnosis not present

## 2019-05-10 ENCOUNTER — Ambulatory Visit: Payer: PRIVATE HEALTH INSURANCE | Admitting: Cardiology

## 2019-06-20 ENCOUNTER — Other Ambulatory Visit: Payer: Self-pay

## 2019-06-20 ENCOUNTER — Ambulatory Visit (INDEPENDENT_AMBULATORY_CARE_PROVIDER_SITE_OTHER): Payer: Medicare Other | Admitting: Cardiology

## 2019-06-20 VITALS — BP 149/83 | HR 68 | Ht 68.0 in | Wt 192.0 lb

## 2019-06-20 DIAGNOSIS — I2510 Atherosclerotic heart disease of native coronary artery without angina pectoris: Secondary | ICD-10-CM

## 2019-06-20 DIAGNOSIS — E785 Hyperlipidemia, unspecified: Secondary | ICD-10-CM

## 2019-06-20 DIAGNOSIS — I10 Essential (primary) hypertension: Secondary | ICD-10-CM

## 2019-06-20 DIAGNOSIS — Z9861 Coronary angioplasty status: Secondary | ICD-10-CM | POA: Diagnosis not present

## 2019-06-20 DIAGNOSIS — I493 Ventricular premature depolarization: Secondary | ICD-10-CM | POA: Diagnosis not present

## 2019-06-20 DIAGNOSIS — I420 Dilated cardiomyopathy: Secondary | ICD-10-CM | POA: Diagnosis not present

## 2019-06-20 DIAGNOSIS — R600 Localized edema: Secondary | ICD-10-CM | POA: Diagnosis not present

## 2019-06-20 DIAGNOSIS — I447 Left bundle-branch block, unspecified: Secondary | ICD-10-CM | POA: Diagnosis not present

## 2019-06-20 DIAGNOSIS — G4733 Obstructive sleep apnea (adult) (pediatric): Secondary | ICD-10-CM

## 2019-06-20 NOTE — Patient Instructions (Signed)
Medication Instructions:  RESTART THE BETA BLOCKER - CARVEDILOL OR BISOPROLOL   CALL THE OFFICE ONCE  YOU HAVE DECIDE WHICH MEDICATION IS WORKING. - SO WE MAY REFILL.  *If you need a refill on your cardiac medications before your next appointment, please call your pharmacy*  Lab Work: NOT NEEDED    Testing/Procedures: WILL BE SCHEDULE AT [place] [address] Your physician has requested that you have an echocardiogram. Echocardiography is a painless test that uses sound waves to create images of your heart. It provides your doctor with information about the size and shape of your heart and how well your heart's chambers and valves are working. This procedure takes approximately one hour. There are no restrictions for this procedure.    Follow-Up: At [facility], you and your health needs are our priority.  As part of our continuing mission to provide you with exceptional heart care, we have created designated Provider Care Teams.  These Care Teams include your primary Cardiologist (physician) and Advanced Practice Providers (APPs -  Physician Assistants and Nurse Practitioners) who all work together to provide you with the care you need, when you need it.  Your next appointment:   6 months-April 2021  The format for your next appointment:   In Person  Provider:   [name], MD  Other Instructions

## 2019-06-20 NOTE — Progress Notes (Signed)
[facility]  PCP: [name], MD  Clinic Note: Chief Complaint  Patient presents with  . Follow-up    SOB/DOE - BP up  . Coronary Artery Disease  . Cardiomyopathy    HPI:   [name] is a 78 y.o. male with CAD-CABG-PCI with CM/LBBB (EF down to 35%) who presents today for ~ 10 month f/u.Marland Kitchen Delayed follow-up due to COVID-19   CABG 07/2004 (Sx = fatigue & PVCs) --> CATH -> MV CAD --> CABG X 4 (RIMA-LAD, LIMA-Diag, SVG-OM, SVG-RCA)  04/2011 - recurrent Sx -> Myoview + for Inf Ischemia --> Cath => Only RIMA-LAD patent --> DES PCI of Native Cx-OM (2 overlapping Promus DES) & Native RCA  Most recent PCI - June 2016 - DES PCI of the dRCA-RPAV. Since then he has done very well with no active anginal symptoms.  Cardiomyopathy: April 2019 echo with EF of roughly 35%  -noted new LBBB diagnosis.  Relook Cath June 2019 - patent stents & RIMA-LAD.  EF estimated 35 to 45%.  Normal right heart cath numbers.  PCWP 12 mmHg.  LV EDP 14 mmHg  [name] was last seen on in Dec 2019  Recent Hospitalizations: none   Reviewed  CV studies:   The following studies were reviewed today: (if available, images/films reviewed: From Epic Chart or Care Everywhere) . No new studies:   Interval History:   [name] returns today overall somewhat frustrated because his health is not going as well as he would like.  He still has dyspnea, but less notable PVCs.  Part of the reason he is a bit upset is because he is concerned about the health care of this son who recently underwent surgery.  He notes that in the last several months leading up to [name] - he & his wife spent lots of time traveling back & forth to be with there son w/ recent Dx of MR/Cardiomyopathy --> s/p Sgx.   Got out of his exercise routine & then COVID hit, so he never got back to the gym.  He has been quite sedentary as a result.  He does get some exertional dyspnea and is gives out early.  Not very active now.  He denies any chest pain or  pressure with rest or exertion but never really had angina.  His symptoms were always dyspnea and PVCs.  He actually notes that his blood pressure is gone up since stopping his beta-blocker dose.  Thankfully, the PVCs did not come up very much.  He actually has restarted taking bisoprolol to start extent.  Cardiovascular review of symptoms:positive for - dyspnea on exertion, irregular heartbeat and Exercise intolerance/fatigue negative for - edema, orthopnea, paroxysmal nocturnal dyspnea, rapid heart rate, shortness of breath or Syncope/near syncope, TIA/amaurosis fugax, claudication  The patient does not have symptoms concerning for COVID-19 infection (fever, chills, cough, or new shortness of breath).  The patient is practicing social distancing. ++ Masking.  Rarely goes out for Groceries/shopping.    REVIEWED OF SYSTEMS   ROS: A comprehensive was performed. Review of Systems  Constitutional: Positive for malaise/fatigue. Negative for weight loss.  HENT: Positive for hearing loss. Negative for congestion and nosebleeds.   Respiratory: Negative for cough and shortness of breath.   Cardiovascular: Negative for palpitations (Not really noting frequent PVCs.).  Gastrointestinal: Negative for blood in stool, heartburn and melena.  Musculoskeletal: Negative for falls and joint pain.  Neurological: Positive for dizziness. Negative for headaches.  Endo/Heme/Allergies: Positive for environmental allergies.  Does not bruise/bleed easily.  Psychiatric/Behavioral: Positive for memory loss. The patient is not nervous/anxious and does not have insomnia.        Clearly showing signs of emotional stress with his sons both being very sick.  All other systems reviewed and are negative.  I have reviewed and (if needed) personally updated the patient's problem list, medications, allergies, past medical and surgical history, social and family history.   PAST MEDICAL HISTORY   Past Medical History:   Diagnosis Date  . Allergic rhinitis   . Arthritis    "entire body" (02/19/2015)  . Asthma   . Asthmatic bronchitis   . CAD of autologous vein bypass graft without angina 04/2011   Frequent PVCs & fatigue -> MYOVIEW w/ inferior ischemic with concern for MV distribution --> CATH: only RIMA-LAD patent (LIMA atretic & SVGs occluded) --> Staged PCI of Native RCA & Cx  . CAD S/P percutaneous coronary angioplasty 8/'12; 06/'16   a) PCI- Cx-OM W/ 2 overlapping Resolute DES 3x11mm & 3x60mm stents (3.35mm),  PCI to RCA -  Promus DES 3x32mm (3.53mm);;; b) 6/'16: PCI of dRCA-rPAV Promus Premier DES 2.75X28 (3.0 mm) -- stents patent in 01/2018  . CAS (cerebral atherosclerosis)    CAROTID DOPPLER, 08/25/2011 - mildly abnormal  . Chronic back pain   . Dilated cardiomyopathy ([place]): EF ~35%, 01/05/2018   TTE April 2019: EF~35% with diffuse hypokinesis and septal and lateral walls.  Mildly dilated LV.  "GR 1 "DD with high filling pressures. ?  Left atrium normal size --> new LBBB - no change to Coronary Anatony on Cath  . Diverticulosis   . Erectile dysfunction   . Essential hypertension    Medication intolerant -- only able to take minimal doses of medications  . Family hx of colon cancer   . GERD (gastroesophageal reflux disease)   . Gout   . Hemorrhoids   . Hyperlipidemia with target LDL less than 70    Only able to tolerate very low dose statin - no interest in other options  . Kidney stones   . Myocardial infarction ([place]) 2005  . Polycythemia   . 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 Symptomatic PVCs   . Syncope 2010   ? unclear of etiology    PAST SURGICAL HISTORY   Past Surgical History:  Procedure Laterality Date  . CARDIAC CATHETERIZATION  07/15/2004   CABG recommended; continue medical therapy  . CARDIAC CATHETERIZATION  04/26/2011   Patent RIMA-LAD, atretic LIMA-Dx (previous) 100 and CTO  SVG to Cx and SVG to PDA. LAD 80-90% eccentric stenosis at SP1 that has 70% stenosis. Cx: Large OM and another distal branch with 90-95% stenosis followed by lesion in the OM branch. RCA dominant 60-80% lesion proximally w/ several old lesions. PDA occluded 70-80% annular lesion in the proximal RCA. 3 of 4 grafts CTO RIMA-LAD patent  . CARDIAC CATHETERIZATION N/A 02/20/2015   Procedure: Left Heart Cath and Coronary Angiography;  Surgeon: Leonie Man, MD;  Location: [facility]: Widely patent p-mRCA DES with dRCA 50%-PAVG 80% --> PCI. Patent overlapping stents --mCx-OM. 99% pLAD - patent RIMA-dLAD.  Known CTO SVG-rPL, SVG-OM3, :LIMA-D1.  Marland Kitchen CATARACT EXTRACTION W/ INTRAOCULAR LENS  IMPLANT, BILATERAL Bilateral ~ 2006/2007  . CORONARY ANGIOPLASTY WITH STENT PLACEMENT  04/28/2011; 02/20/2015   PCI-Cx-OM 3 overlapping Resolute DES 3 mm x58mm and 3x73mm -->  3.45mm; PCI to RCA - Promus Element DES 3x11mm --> 3.76mm;; b) dRCA-rPAV: Promus Premier DES 2.72mm X 28 mm (3.0 mm)   . CORONARY ARTERY BYPASS GRAFT  07/23/2004   LIMA-Cx RIMA-LAD, SVG-diagonal, SVG-PDA  . RIGHT/LEFT HEART CATH AND CORONARY/GRAFT ANGIOGRAPHY N/A 01/09/2018   Procedure: RIGHT/LEFT HEART CATH AND CORONARY/GRAFT ANGIOGRAPHY;  Surgeon: Leonie Man, MD;; Known occluded LIMA-Diag & SVG-RCA, SVG-OM.  Mod-severe pLAD that is grafted after D1 - patent RIMA- wraparound LAD, distal 1/2 of PDA territory. Patent Cx-OM DES (ost Cx mild) & p-mRCA overlapping DES & dRCA-RPAV DES.  PCWP 12 mmHg, LVP-EDP: 140/6 - 14 mmH. RAP 2 mmHg. RVP-EDP 33/0 - 5  mmHg  . TRANSTHORACIC ECHOCARDIOGRAM  02/18/2015    Mild concentric LVH, EF 55-60%, no RWMA, Gr 1 DD, aortic sclerosis without stenosis  . TRANSURETHRAL RESECTION OF PROSTATE  ~ 2002/2003    TTE April 2019: EF~35% with diffuse hypokinesis and septal and lateral walls.  Mildly dilated LV.  "GR 1 "DD with high filling pressures. ?  Left atrium normal size  RLCP 01/2018:  LIMA-Diag, SVG-RCA, SVG-OM  known CTO. Patent RIMA-LAD (severe native LAD disease prior to D1 & D2 both with extensive disease) - retrograde fills to CTO &antegrade wrap-around LAD (distal 1/2 of PDA). DES in LCx-OM & p-m RCA & dRCA-RPAV widely patent with patent RPDA. Normal RHC #s: PCWP / LVEDP ~12-14 mmHg. RAP & RVEDP ~0-2 mmHg.      MEDICATIONS/ALLERGIES   Current Meds  Medication Sig  . acetaminophen (TYLENOL) 500 MG tablet Take 1,000 mg by mouth daily as needed for moderate pain.  Marland Kitchen ALPRAZolam (XANAX) 0.5 MG tablet Take 0.5 mg by mouth as directed.  Marland Kitchen aspirin EC 81 MG tablet Take 81 mg by mouth as directed.  . bisoprolol (ZEBETA) 5 MG tablet Take 1/4 tablet daily for 1 week, then STOP  . budesonide-formoterol (SYMBICORT) 80-4.5 MCG/ACT inhaler Inhale 2 puffs into the lungs every 4 (four) hours as needed (shortness of breath).  . chlorzoxazone (PARAFON) 500 MG tablet Take 500 mg by mouth daily as needed for muscle spasms.  . mometasone (NASONEX) 50 MCG/ACT nasal spray Place 2 sprays into the nose daily as needed (congestion).  . nystatin-triamcinolone (MYCOLOG II) cream Apply 1 application topically daily as needed (yeast infections).  Vladimir Faster Glycol-Propyl Glycol (SYSTANE OP) Place 1 drop into both eyes 2 (two) times daily.  . rosuvastatin (CRESTOR) 5 MG tablet Take 5 mg by mouth every evening.     Allergies  Allergen Reactions  . Meperidine Hcl     Cold sweats, dizziness   . Penicillins     Severe headaches Has patient had a PCN reaction causing immediate rash, facial/tongue/throat swelling, SOB or lightheadedness with hypotension: No Has patient had a PCN reaction causing severe rash involving mucus membranes or skin necrosis: No Has patient had a PCN reaction that required hospitalization: No Has patient had a PCN reaction occurring within the last 10 years: No If all of the above answers are "NO", then may proceed with Cephalosporin use.     SOCIAL HISTORY/FAMILY HISTORY   Social History    Tobacco Use  . Smoking status: Former Smoker    Packs/day: 1.00    Years: 7.00    Pack years: 7.00    Types: Cigarettes    Quit date: 09/06/1963    Years since quitting: 55.8  . Smokeless tobacco: Never Used  Substance Use Topics  . Alcohol use: No  . Drug use: No  Social History   Social History Narrative   He is made to patient of mine. He works for [employer], as a Engineer, manufacturing systems. He is a former smoker but quit in 1965 after 7 years. He does not drink alcohol.   He is very busy with work, and does not get routine exercise. He plans to work maybe one more year and then will retire. 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    family history includes Colon cancer in his brother; Heart disease in his brother; Heart disease (age of onset: 25) in his father; Hypertension (age of onset: 42) in his brother; Hypertension (age of onset: 79) in his mother; Stomach cancer in his sister; Stroke (age of onset: 26) in his mother.   OBJCTIVE -PE, EKG, labs   Wt Readings from Last 3 Encounters:  06/20/19 192 lb (87.1 kg)  08/20/18 191 lb 12.8 oz (87 kg)  02/28/18 193 lb (87.5 kg)    Physical Exam: BP (!) 149/83   Pulse 68   Ht 5\' 8"  (1.727 m)   Wt 192 lb (87.1 kg)   SpO2 95%   BMI 29.19 kg/m  Physical Exam  Constitutional: He is oriented to person, place, and time. He appears well-developed and well-nourished. No distress.  Well-groomed  Neck: JVD (Trivial) present. No hepatojugular reflux present. Carotid bruit is not present.  Cardiovascular: Normal rate, regular rhythm, S1 normal and intact distal pulses.  Occasional extrasystoles are present. PMI is not displaced (Slightly laterally displaced.). Exam reveals gallop and S4. Exam reveals no friction rub.  Murmur (Soft SEM at RUSB.) heard. Split S2 versus S4.  Pulmonary/Chest: Effort normal. No respiratory distress. He has no wheezes. He has no rales.   Abdominal: Soft. Bowel sounds are normal. He exhibits no distension. There is no abdominal tenderness.  Musculoskeletal: Normal range of motion.        General: No edema.  Neurological: He is alert and oriented to person, place, and time.  Psychiatric: He has a normal mood and affect. His behavior is normal. Judgment and thought content normal.  Vitals reviewed.   Adult ECG Report  Rate: 68 ;  Rhythm: normal sinus rhythm and LBBB.  L axis deviation (-31).;    Narrative Interpretation: Stable EKG  Recent Labs: TC 218, TG 144, HDL 47, LDL 94 (taking 5 mg Crestor daily)  ASSESSMENT/PLAN    Problem List Items Addressed This Visit    CAD S/P  PCI of Cx-OM w/ 2 Resolute DES 3.0 x 15 & 3.0 x 18 (3.20mm); PCI-pRCA: Promus Element DES 3.0 x 38 (3.53mm); dRCA-rPAV: Promus Premier DES 2.75x28 (3 mm) - Primary (Chronic)    He has had failure of follow-up with graft except the RIMA to distal LAD.  We have revascularized the circumflex and RCA territory with multiple stents that have been patent by last cath. Very intolerant to medications.  We have barely been on the having 1 minimal dose of beta-blocker and statin.  We are restarting the beta-blocker. He is on aspirin, no longer on Plavix (per choice)  At this point, we will simply continue current meds and try to restart beta-blocker.  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  Cardiac cath showed  relatively stable findings with patent stents.  The only jeopardized area is the upstream portion of the LAD not very amenable to PCI.  The plan was to recheck echocardiogram and then potentially consider BiV ICD versus CRT-D.  Unfortunately because of COVID-19, his follow-up was delayed.      Relevant Orders   EKG 12-Lead (Completed)   ECHOCARDIOGRAM COMPLETE   Dilated cardiomyopathy  ([place]): EF ~35%, new LBBB - no change to Coronary Anatony on Cath (Chronic)    The plan had been to follow-up with him in the spring with an echocardiogram that would then need potentially to referral for CRT-D placement.  Unfortunately the echo was canceled/delayed as was his follow-up visit due to COVID-19.  We are now 10 months later going to recheck an echocardiogram.  Unfortunately, unlike standard of care, we are not able to do beta-blocker ARB, spironolactone etc. as standard management.  He had lots of questions about defibrillators, pacemakers etc.  We spent about 10 minutes discussing the difference to an ICD, pacemaker,BiV pacer etc.  A lot of these questions for for himself but also for his sons.      Relevant Orders   EKG 12-Lead (Completed)   ECHOCARDIOGRAM COMPLETE   Obstructive sleep apnea (Chronic)    Still on CPAP      Hyperlipidemia with target LDL less than 70 (Chronic)    I think this is about as well-controlled lipids is willing to get with the current dose of Crestor.  He did not want to try Zetia and has been reluctant to go to CVRR for potential consideration of other medications including PCSK9 inhibitors.  At 78 years old, he has not been a change and therefore I simply encouraged him to watch his diet and try to pick up his exercise.  I explained to him the risks of not controlling more aggressively, he understands.      Bilateral lower extremity edema (Chronic)    He does take Lasix every now and then, but not much.      Essential hypertension (Chronic)    Interestingly his blood pressure is up now.  He has been intolerance of despite having medicine be tried. Plan for now we will try to restart beta-blocker trying carvedilol first and if that does not succeed he will go back to bisoprolol.  My general understanding with [place] is that he will take the medications as he wants to take them regardless of recommendations.  He agreed to restart  beta-blocker.--This will be a much lower dose than I would recommend, but he is very "symptomatic" when he takes medicines      PVC's (premature ventricular contractions) (Chronic)    This is usually been his major issue.  They are less prominent, but his blood pressure is high now.  At this point I think is probably reasonable to go back to taking carvedilol.  He is going to try starting low doses of first carvedilol and if not able to tolerate will go back to bisoprolol.  Regardless of what dose I recommended, he takes a low dose.  He was taken 1.25 mg of bisoprolol (which was a quarter of a tablet) before we stopped.      Relevant Orders   EKG 12-Lead (Completed)   ECHOCARDIOGRAM COMPLETE      COVID-19 Education: The signs and symptoms of COVID-19 were discussed with the patient and how to seek care for testing (follow up with PCP or arrange E-visit).   The importance of  social distancing was discussed today.  I spent a total of 35minutes with the patient and chart review. >  50% of the time was spent in direct patient consultation.  Additional time spent with chart review (studies, outside notes, etc): 8 Total Time: 31 min   Current medicines are reviewed at length with the patient today.  (+/- concerns) question whether or not he should try going back on carvedilol.   Patient Instructions / Medication Changes & Studies & Tests Ordered   Patient Instructions  Medication Instructions:  RESTART THE BETA BLOCKER - CARVEDILOL OR BISOPROLOL   CALL THE OFFICE ONCE  YOU HAVE DECIDE WHICH MEDICATION IS WORKING. - SO WE MAY REFILL.  *If you need a refill on your cardiac medications before your next appointment, please call your pharmacy*  Lab Work: NOT NEEDED    Testing/Procedures: WILL BE SCHEDULE AT [facility] [facility] Your physician has requested that you have an echocardiogram. Echocardiography is a painless test that uses sound waves to create images of your  heart. It provides your doctor with information about the size and shape of your heart and how well your heart's chambers and valves are working. This procedure takes approximately one hour. There are no restrictions for this procedure.    Follow-Up: At [facility], you and your health needs are our priority.  As part of our continuing mission to provide you with exceptional heart care, we have created designated Provider Care Teams.  These Care Teams include your primary Cardiologist (physician) and Advanced Practice Providers (APPs -  Physician Assistants and Nurse Practitioners) who all work together to provide you with the care you need, when you need it.  Your next appointment:   6 months-April 2021  The format for your next appointment:   In Person  Provider:   [name], MD  Other Instructions   Studies Ordered:   Orders Placed This Encounter  Procedures  . EKG 12-Lead  . ECHOCARDIOGRAM COMPLETE     [name], M.D., M.S. Interventional Cardiologist   Pager # [phone] Phone # [phone] [address]. [place], [place] [address]   Thank you for choosing Heartcare at [facility]!!

## 2019-06-22 ENCOUNTER — Encounter: Payer: Self-pay | Admitting: Cardiology

## 2019-06-22 NOTE — Assessment & Plan Note (Signed)
This is usually been his major issue.  They are less prominent, but his blood pressure is high now.  At this point I think is probably reasonable to go back to taking carvedilol.  He is going to try starting low doses of first carvedilol and if not able to tolerate will go back to bisoprolol.  Regardless of what dose I recommended, he takes a low dose.  He was taken 1.25 mg of bisoprolol (which was a quarter of a tablet) before we stopped.

## 2019-06-22 NOTE — Assessment & Plan Note (Signed)
He does take Lasix every now and then, but not much.

## 2019-06-22 NOTE — Assessment & Plan Note (Signed)
Still on CPAP

## 2019-06-22 NOTE — Assessment & Plan Note (Signed)
The plan had been to follow-up with him in the spring with an echocardiogram that would then need potentially to referral for CRT-D placement.  Unfortunately the echo was canceled/delayed as was his follow-up visit due to COVID-19.  We are now 10 months later going to recheck an echocardiogram.  Unfortunately, unlike standard of care, we are not able to do beta-blocker ARB, spironolactone etc. as standard management.  He had lots of questions about defibrillators, pacemakers etc.  We spent about 10 minutes discussing the difference to an ICD, pacemaker,BiV pacer etc.  A lot of these questions for for himself but also for his sons.

## 2019-06-22 NOTE — Assessment & Plan Note (Signed)
I think this is about as well-controlled lipids is willing to get with the current dose of Crestor.  He did not want to try Zetia and has been reluctant to go to CVRR for potential consideration of other medications including PCSK9 inhibitors.  At 78 years old, he has not been a change and therefore I simply encouraged him to watch his diet and try to pick up his exercise.  I explained to him the risks of not controlling more aggressively, he understands.

## 2019-06-22 NOTE — Assessment & Plan Note (Signed)
This was diagnosed as of last year.  Cardiac cath showed relatively stable findings with patent stents.  The only jeopardized area is the upstream portion of the LAD not very amenable to PCI.  The plan was to recheck echocardiogram and then potentially consider BiV ICD versus CRT-D.  Unfortunately because of COVID-19, his follow-up was delayed.

## 2019-06-22 NOTE — Assessment & Plan Note (Signed)
Interestingly his blood pressure is up now.  He has been intolerance of despite having medicine be tried. Plan for now we will try to restart beta-blocker trying carvedilol first and if that does not succeed he will go back to bisoprolol.  My general understanding with [place] is that he will take the medications as he wants to take them regardless of recommendations.  He agreed to restart beta-blocker.--This will be a much lower dose than I would recommend, but he is very "symptomatic" when he takes medicines

## 2019-06-22 NOTE — Assessment & Plan Note (Addendum)
He has had failure of follow-up with graft except the RIMA to distal LAD.  We have revascularized the circumflex and RCA territory with multiple stents that have been patent by last cath. Very intolerant to medications.  We have barely been on the having 1 minimal dose of beta-blocker and statin.  We are restarting the beta-blocker. He is on aspirin, no longer on Plavix (per choice)  At this point, we will simply continue current meds and try to restart beta-blocker.  Encouraged him to get back into activity

## 2019-06-25 ENCOUNTER — Other Ambulatory Visit: Payer: Self-pay

## 2019-06-25 ENCOUNTER — Ambulatory Visit (HOSPITAL_COMMUNITY): Payer: Medicare Other | Attending: Cardiology

## 2019-06-25 DIAGNOSIS — I420 Dilated cardiomyopathy: Secondary | ICD-10-CM | POA: Diagnosis not present

## 2019-06-25 DIAGNOSIS — I493 Ventricular premature depolarization: Secondary | ICD-10-CM | POA: Diagnosis not present

## 2019-06-25 DIAGNOSIS — I2510 Atherosclerotic heart disease of native coronary artery without angina pectoris: Secondary | ICD-10-CM

## 2019-06-25 DIAGNOSIS — Z9861 Coronary angioplasty status: Secondary | ICD-10-CM

## 2019-06-25 DIAGNOSIS — I447 Left bundle-branch block, unspecified: Secondary | ICD-10-CM | POA: Diagnosis not present

## 2019-07-01 ENCOUNTER — Telehealth: Payer: Self-pay | Admitting: *Deleted

## 2019-07-01 DIAGNOSIS — R931 Abnormal findings on diagnostic imaging of heart and coronary circulation: Secondary | ICD-10-CM

## 2019-07-01 DIAGNOSIS — I420 Dilated cardiomyopathy: Secondary | ICD-10-CM

## 2019-07-01 NOTE — Telephone Encounter (Signed)
-----   Message from Leonie Man, MD sent at 06/27/2019  6:25 PM EDT ----- Essentially no real change of the echocardiogram.  Pump function is stable.  Has not gotten worse but unfortunately not any better.  This does place [place] in the category with which we would consider the possibility of defibrillator or further therapy.  I do not personally do this procedure, but would be happy to refer you to our heart electricians (electrophysiologists) to discuss recommendations and options. My suggestion to them would be to consider the possibility of doing BiVentricular ICD (the BiVentricular pacemaker function may potentially help to "resynchronize " your heartbeats mitigating the effect of the left bundle branch block)  [name], MD .

## 2019-07-01 NOTE — Telephone Encounter (Signed)
Called both numbers - mobile #  - busy tone  home# left message on answer machine to call back .

## 2019-07-02 NOTE — Telephone Encounter (Signed)
Follow up      Pt is calling to speak with [name]     Please call back

## 2019-07-02 NOTE — Telephone Encounter (Signed)
Follow up    Please return call to [phone] with echo results

## 2019-07-02 NOTE — Telephone Encounter (Signed)
Spoke to patient . Patient decide to proceeds with referral  To see EP.  REFERRAL PLACED

## 2019-07-02 NOTE — Telephone Encounter (Signed)
Patient made aware of results and verbalized understanding.  He stated that he wanted to have some time to think this over and will get back in touch with [name], Dr. Allison Quarry nurse, in a few days.

## 2019-07-08 ENCOUNTER — Other Ambulatory Visit: Payer: Self-pay

## 2019-07-08 ENCOUNTER — Encounter: Payer: Self-pay | Admitting: Cardiology

## 2019-07-08 ENCOUNTER — Ambulatory Visit (INDEPENDENT_AMBULATORY_CARE_PROVIDER_SITE_OTHER): Payer: Medicare Other | Admitting: Cardiology

## 2019-07-08 VITALS — BP 142/86 | HR 83 | Ht 68.0 in | Wt 193.6 lb

## 2019-07-08 DIAGNOSIS — I5022 Chronic systolic (congestive) heart failure: Secondary | ICD-10-CM

## 2019-07-08 NOTE — Patient Instructions (Signed)
Medication Instructions:  Your physician recommends that you continue on your current medications as directed. Please refer to the Current Medication list given to you today.     * If you need a refill on your cardiac medications before your next appointment, please call your pharmacy. *   Labwork: Pre procedure lab work today: BMET & CBC w/ diff If you have labs (blood work) drawn today and your tests are completely normal, you will receive your results only by:  [place] (if you have MyChart) OR  A paper copy in the mail If you have any lab test that is abnormal or we need to change your treatment, we will call you to review the results.   Testing/Procedures: Your physician has recommended that you have a defibrillator inserted. An implantable cardioverter defibrillator (ICD) is a small device that is placed in your chest or, in rare cases, your abdomen. This device uses electrical pulses or shocks to help control life-threatening, irregular heartbeats that could lead the heart to suddenly stop beating (sudden cardiac arrest). Leads are attached to the ICD that goes into your heart. This is done in the hospital and usually requires an overnight stay. Please follow the instructions below, located under the special instructions section.   Follow-Up: Your physician recommends that you schedule a wound check appointment 10-14 days, after your procedure on __________, with the device clinic.  Your physician recommends that you schedule a follow up appointment in 91 days, after your procedure on __________, with Dr. [name].  Thank you for choosing [facility]!!   Trinidad Curet, RN [phone]   Any Other Special Instructions Will Be Listed Below (If Applicable).   Implantable Device Instructions  You are scheduled for: Implantable cardiac defibrillator on _________ with Dr. [name].  1.   Pre procedure testing-             A.  LAB WORK--- On __________ you are  scheduled to have blood work at the [facility] (see address at the top of this letter) any time after 8:00 am.  You do not need to be fasting.  ASK FOR YOUR SURGICAL SCRUB!              B. COVID TEST-- On ________ @ ______ Dennis Bast will go to [facility] ([place]) for your Covid testing.   This is a drive thru test site.  There will be multiple testing areas.  Be sure to share with the first checkpoint that you are there for pre-procedure/surgery testing. This will put you into the right (yellow) lane that leads to the PAT testing team.   Stay in your car and the nurse team will come to your car to test you.  After you are tested please go home and self quarantine until the day of your procedure.    2. On the day of your procedure _________ you will go to [facility] hospital [phone]) at __________.  You will go to the main entrance [address] [employer]) and enter where the [employer] parking staff are.  You will check in at ADMITTING.  You may have one support person come in to the hospital with you.  They will be asked to wait in the waiting room.   3.   Do not eat or drink after midnight prior to your procedure.   4.   On the morning of your procedure do NOT take any medication.  5.  The  night before your procedure and the morning of your procedure scrub your neck/chest with surgical scrub.  An instruction letter is included with this letter.    5.  Plan for an overnight stay.  If you use your phone frequently bring your phone charger.  When you are discharged you will need someone to drive you home.   6.  You will follow up with the [facility] clinic 10-14 days after your procedure. You will follow up with Dr. [name] 91 days after your procedure.  These appointments will be made for you.   * If you have ANY questions after you get home, please call the office [phone] and ask for Arran Fessel RN or send a MyChart message.    [facility] - Preparing For  Surgery  Before surgery, you can play an important role. Because skin is not sterile, your skin needs to be as free of germs as possible. You can reduce the number of germs on your skin by washing with CHG (chlorahexidine gluconate) Soap before surgery.  CHG is an antiseptic cleaner which kills germs and bonds with the skin to continue killing germs even after washing.   Please do not use if you have an allergy to CHG or antibacterial soaps.  If your skin becomes reddened/irritated stop using the CHG.   Do not shave (including legs and underarms) for at least 48 hours prior to first CHG shower.  It is OK to shave your face.  Please follow these instructions carefully:  1.  Shower the night before surgery and the morning of surgery with CHG.  2.  If you choose to wash your hair, wash your hair first as usual with your normal shampoo.  3.  After you shampoo, rinse your hair and body thoroughly to remove the shampoo.  4.  Use CHG as you would any other liquid soap.  You can apply CHG directly to the skin and wash gently with a clean washcloth. 5.  Apply the CHG Soap to your body ONLY FROM THE NECK DOWN.  Do not use on open wounds or open sores.  Avoid contact with your eyes, ears, mouth and genitals (private parts).  Wash genitals (private parts) with your normal soap.  6.  Wash thoroughly, paying special attention to the area where your surgery will be performed.  7.  Thoroughly rinse your body with warm water from the neck down.   8.  DO NOT shower/wash with your normal soap after using and rinsing off the CHG soap.  9.  Pat yourself dry with a clean towel.           10.  Wear clean pajamas.           11.  Place clean sheets on your bed the night of your first shower and do not sleep with pets.  Day of Surgery: Do not apply any deodorants/lotions.  Please wear clean clothes to the hospital/surgery center.     Cardioverter Defibrillator Implantation An implantable cardioverter defibrillator  (ICD) is a small, lightweight, battery-powered device that is placed (implanted) under the skin in the chest or abdomen. Your caregiver may prescribe an ICD if:  You have had an irregular heart rhythm (arrhythmia) that originated in the lower chambers of the heart (ventricles).  Your heart has been damaged by a disease (such as coronary artery disease) or heart condition (such as a heart attack). An ICD consists of a battery that lasts several years, a small computer called  a pulse generator, and wires called leads that go into the heart. It is used to detect and correct two dangerous arrhythmias: a rapid heart rhythm (tachycardia) and an arrhythmia in which the ventricles contract in an uncoordinated way (fibrillation). When an ICD detects tachycardia, it sends an electrical signal to the heart that restores the heartbeat to normal (cardioversion). This signal is usually painless. If cardioversion does not work or if the ICD detects fibrillation, it delivers a small electrical shock to the heart (defibrillation) to restart the heart. The shock may feel like a strong jolt in the chest. ICDs may be programmed to correct other problems. Sometimes, ICDs are programmed to act as another type of implantable device called a pacemaker. Pacemakers are used to treat a slow heartbeat (bradycardia). LET YOUR CAREGIVER KNOW ABOUT:  Any allergies you have.  All medicines you are taking, including vitamins, herbs, eyedrops, and over-the-counter medicines and creams.  Previous problems you or members of your family have had with the use of anesthetics.  Any blood disorders you have had.  Other health problems you have. RISKS AND COMPLICATIONS Generally, the procedure to implant an ICD is safe. However, as with any surgical procedure, complications can occur. Possible complications associated with implanting an ICD include:  Swelling, bleeding, or bruising at the site where the ICD was implanted.  Infection at  the site where the ICD was implanted.  A reaction to medicine used during the procedure.  Nerve, heart, or blood vessel damage.  Blood clots. BEFORE THE PROCEDURE  You may need to have blood tests, heart tests, or a chest X-ray done before the day of the procedure.  Ask your caregiver about changing or stopping your regular medicines.  Make plans to have someone drive you home. You may need to stay in the hospital overnight after the procedure.  Stop smoking at least 24 hours before the procedure.  Take a bath or shower the night before the procedure. You may need to scrub your chest or abdomen with a special type of soap.  Do not eat or drink before your procedure for as long as directed by your caregiver. Ask if it is okay to take any needed medicine with a small sip of water. PROCEDURE  The procedure to implant an ICD in your chest or abdomen is usually done at a hospital in a room that has a large X-ray machine called a fluoroscope. The machine will be above you during the procedure. It will help your caregiver see your heart during the procedure. Implanting an ICD usually takes 1-3 hours. Before the procedure:   Small monitors will be put on your body. They will be used to check your heart, blood pressure, and oxygen level.  A needle will be put into a vein in your hand or arm. This is called an intravenous (IV) access tube. Fluids and medicine will flow directly into your body through the IV tube.  Your chest or abdomen will be cleaned with a germ-killing (antiseptic) solution. The area may be shaved.  You may be given medicine to help you relax (sedative).  You will be given a medicine called a local anesthetic. This medicine will make the surgical site numb while the ICD is implanted. You will be sleepy but awake during the procedure. After you are numb the procedure will begin. The caregiver will:  Make a small cut (incision). This will make a pocket deep under your skin  that will hold the pulse generator.  Guide  the leads through a large blood vessel into your heart and attach them to the heart muscles. Depending on the ICD, the leads may go into one ventricle or they may go to both ventricles and into an upper chamber of the heart (atrium).  Test the ICD.  Close the incision with stitches, glue, or staples. AFTER THE PROCEDURE  You may feel pain. Some pain is normal. It may last a few days.  You may stay in a recovery area until the local anesthetic has worn off. Your blood pressure and pulse will be checked often. You will be taken to a room where your heart will be monitored.  A chest X-ray will be taken. This is done to check that the cardioverter defibrillator is in the right place.  You may stay in the hospital overnight.  A slight bump may be seen over the skin where the ICD was placed. Sometimes, it is possible to feel the ICD under the skin. This is normal.  In the months and years afterward, your caregiver will check the device, the leads, and the battery every few months. Eventually, when the battery is low, the ICD will be replaced.   This information is not intended to replace advice given to you by your health care provider. Make sure you discuss any questions you have with your health care provider.   Document Released: 05/14/2002 Document Revised: 06/12/2013 Document Reviewed: 09/10/2012 Elsevier Interactive Patient Education 2016 [place] Defibrillator Implantation, Care After This sheet gives you information about how to care for yourself after your procedure. Your health care provider may also give you more specific instructions. If you have problems or questions, contact your health care provider. What can I expect after the procedure? After the procedure, it is common to have:  Some pain. It may last a few days.  A slight bump over the skin where the device was placed. Sometimes, it is possible to feel  the device under the skin. This is normal.  During the months and years after your procedure, your health care provider will check the device, the leads, and the battery every few months. Eventually, when the battery is low, the device will be replaced. Follow these instructions at home: Medicines  Take over-the-counter and prescription medicines only as told by your health care provider.  If you were prescribed an antibiotic medicine, take it as told by your health care provider. Do not stop taking the antibiotic even if you start to feel better. Incision care   Follow instructions from your health care provider about how to take care of your incision area. Make sure you: ? Wash your hands with soap and water before you change your bandage (dressing). If soap and water are not available, use hand sanitizer. ? Change your dressing as told by your health care provider. ? Leave stitches (sutures), skin glue, or adhesive strips in place. These skin closures may need to stay in place for 2 weeks or longer. If adhesive strip edges start to loosen and curl up, you may trim the loose edges. Do not remove adhesive strips completely unless your health care provider tells you to do that.  Check your incision area every day for signs of infection. Check for: ? More redness, swelling, or pain. ? More fluid or blood. ? Warmth. ? Pus or a bad smell.  Do not use lotions or ointments near the incision area unless told by your health care provider.  Keep the  incision area clean and dry for 2-3 days after the procedure or for as long as told by your health care provider. It takes several weeks for the incision site to heal completely.  Do not take baths, swim, or use a hot tub until your health care provider approves. Activity  Try to walk a little every day. Exercising is important after this procedure. Also, use your shoulder on the side of the defibrillator in daily tasks that do not require a lot of  motion.  For at least 6 weeks: ? Do not lift your upper arm above your shoulders. This means no tennis, golf, or swimming for this period of time. If you tend to sleep with your arm above your head, use a restraint to prevent this during sleep. ? Avoid sudden jerking, pulling, or chopping movements that pull your upper arm far away from your body.  Ask your health care provider when you may go back to work.  Check with your health care provider before you start to drive or play sports. Electric and magnetic fields  Tell all health care providers that you have a defibrillator. This may prevent them from giving you an MRI scan because strong magnets are used for that test.  If you must pass through a metal detector, quickly walk through it. Do not stop under the detector, and do not stand near it.  Avoid places or objects that have a strong electric or magnetic field, including: ? [employer]. At the airport, let officials know that you have a defibrillator. Your defibrillator ID card will let you be checked in a way that is safe for you and will not damage your defibrillator. Also, do not let a security person wave a magnetic wand near your defibrillator. That can make it stop working. ? Power plants. ? Large electrical generators. ? Anti-theft systems or electronic article surveillance (EAS). ? Radiofrequency transmission towers, such as cell phone and radio towers.  Do not use amateur (ham) radio equipment or electric (arc) welding torches. Some devices are safe to use if held at least 12 inches (30 cm) from your defibrillator. These include power tools, lawn mowers, and speakers. If you are unsure if something is safe to use, ask your health care provider.  Do not use MP3 player headphones. They have magnets.  You may safely use electric blankets, heating pads, computers, and microwave ovens.  When using your cell phone, hold it to the ear that is on the opposite side from  the defibrillator. Do not leave your cell phone in a pocket over the defibrillator. General instructions  Follow diet instructions from your health care provider, if this applies.  Always keep your defibrillator ID card with you. The card should list the implant date, device model, and manufacturer. Consider wearing a medical alert bracelet or necklace.  Have your defibrillator checked every 3-6 months or as often as told by your health care provider. Most defibrillators last for 4-8 years.  Keep all follow-up visits as told by your health care provider. This is important for your health care provider to make sure your chest is healing the way it should. Ask your health care provider when you should come back to have your stitches or staples taken out. Contact a health care provider if:  You feel one shock in your chest.  You gain weight suddenly.  Your legs or feet swell more than they have before.  It feels like your heart is fluttering or skipping  beats (heart palpitations).  You have more redness, swelling, or pain around your incision.  You have more fluid or blood coming from your incision.  Your incision feels warm to the touch.  You have pus or a bad smell coming from your incision.  You have a fever. Get help right away if:  You have chest pain.  You feel more than one shock.  You feel more short of breath than you have felt before.  You feel more light-headed than you have felt before.  Your incision starts to open up. This information is not intended to replace advice given to you by your health care provider. Make sure you discuss any questions you have with your health care provider. Document Released: 03/11/2005 Document Revised: 03/11/2016 Document Reviewed: 01/27/2016 Elsevier Interactive Patient Education  2018 [place] Discharge Instructions for  Pacemaker/Defibrillator Patients  ACTIVITY No heavy lifting or vigorous activity  with your left/right arm for 6 to 8 weeks.  Do not raise your left/right arm above your head for one week.  Gradually raise your affected arm as drawn below.           __  NO DRIVING for     ; you may begin driving on     .  WOUND CARE - Keep the wound area clean and dry.  Do not get this area wet for one week. No showers for one week; you may shower on     . - The tape/steri-strips on your wound will fall off; do not pull them off.  No bandage is needed on the site.  DO  NOT apply any creams, oils, or ointments to the wound area. - If you notice any drainage or discharge from the wound, any swelling or bruising at the site, or you develop a fever > 101? F after you are discharged home, call the office at once.  SPECIAL INSTRUCTIONS - You are still able to use cellular telephones; use the ear opposite the side where you have your pacemaker/defibrillator.  Avoid carrying your cellular phone near your device. - When traveling through airports, show security personnel your identification card to avoid being screened in the metal detectors.  Ask the security personnel to use the hand wand. - Avoid arc welding equipment, MRI testing (magnetic resonance imaging), TENS units (transcutaneous nerve stimulators).  Call the office for questions about other devices. - Avoid electrical appliances that are in poor condition or are not properly grounded. - Microwave ovens are safe to be near or to operate.  ADDITIONAL INFORMATION FOR DEFIBRILLATOR PATIENTS SHOULD YOUR DEVICE GO OFF: - If your device goes off ONCE and you feel fine afterward, notify the device clinic nurses. - If your device goes off ONCE and you do not feel well afterward, call 911. - If your device goes off TWICE, call 911. - If your device goes off [place], call 911.  DO NOT DRIVE YOURSELF OR A FAMILY MEMBER WITH A DEFIBRILLATOR TO THE HOSPITAL--CALL 911.

## 2019-07-08 NOTE — Progress Notes (Signed)
Electrophysiology Office Note   Date:  07/08/2019   ID:  [name], [name] 12/19/1940, MRN [id number]  PCP:  [name], MD  Cardiologist:  Ellyn Hack Primary Electrophysiologist:  Zandon Talton Meredith Leeds, MD    Chief Complaint: CHF   History of Present Illness: [name] is a 78 y.o. male who is being seen today for the evaluation of CHF at the request of [name] Man, MD. Presenting today for electrophysiology evaluation.  He has a history of coronary artery disease status post CABG, PVCs, CHF, hypertension, hyperlipidemia.  Today is a weakness and fatigue.  He feels like he does not have the energy to do exactly what he wants to do.  Today, he denies symptoms of palpitations, chest pain, shortness of breath, orthopnea, PND, lower extremity edema, claudication, dizziness, presyncope, syncope, bleeding, or neurologic sequela. The patient is tolerating medications without difficulties.    Past Medical History:  Diagnosis Date  . Allergic rhinitis   . Arthritis    "entire body" (02/19/2015)  . Asthma   . Asthmatic bronchitis   . CAD of autologous vein bypass graft without angina 04/2011   Frequent PVCs & fatigue -> MYOVIEW w/ inferior ischemic with concern for MV distribution --> CATH: only RIMA-LAD patent (LIMA atretic & SVGs occluded) --> Staged PCI of Native RCA & Cx  . CAD S/P percutaneous coronary angioplasty 8/'12; 06/'16   a) PCI- Cx-OM W/ 2 overlapping Resolute DES 3x28mm & 3x76mm stents (3.62mm),  PCI to RCA -  Promus DES 3x24mm (3.71mm);;; b) 6/'16: PCI of dRCA-rPAV Promus Premier DES 2.75X28 (3.0 mm) -- stents patent in 01/2018  . CAS (cerebral atherosclerosis)    CAROTID DOPPLER, 08/25/2011 - mildly abnormal  . Chronic back pain   . Dilated cardiomyopathy ([place]): EF ~35%, 01/05/2018   TTE April 2019: EF~35% with diffuse hypokinesis and septal and lateral walls.  Mildly dilated LV.  "GR 1 "DD with high filling pressures. ?  Left atrium normal size --> new LBBB - no change  to Coronary Anatony on Cath  . Diverticulosis   . Erectile dysfunction   . Essential hypertension    Medication intolerant -- only able to take minimal doses of medications  . Family hx of colon cancer   . GERD (gastroesophageal reflux disease)   . Gout   . Hemorrhoids   . Hyperlipidemia with target LDL less than 70    Only able to tolerate very low dose statin - no interest in other options  . Kidney stones   . Myocardial infarction ([place]) 2005  . Polycythemia   . 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 Symptomatic PVCs   . Syncope 2010   ? unclear of etiology   Past Surgical History:  Procedure Laterality Date  . CARDIAC CATHETERIZATION  07/15/2004   CABG recommended; continue medical therapy  . CARDIAC CATHETERIZATION  04/26/2011   Patent RIMA-LAD, atretic LIMA-Dx (previous) 100 and CTO SVG to Cx and SVG to PDA. LAD 80-90% eccentric stenosis at SP1 that has 70% stenosis. Cx: Large OM and another distal branch with 90-95% stenosis followed by lesion in the OM branch. RCA dominant 60-80% lesion proximally w/ several old lesions. PDA occluded 70-80% annular lesion in the proximal RCA. 3 of 4 grafts CTO RIMA-LAD patent  . CARDIAC CATHETERIZATION N/A 02/20/2015   Procedure: Left Heart Cath and Coronary Angiography;  Surgeon:  [name] Man, MD;  Location: [facility] INVASIVE CV LAB: Widely patent p-mRCA DES with dRCA 50%-PAVG 80% --> PCI. Patent overlapping stents --mCx-OM. 99% pLAD - patent RIMA-dLAD.  Known CTO SVG-rPL, SVG-OM3, :LIMA-D1.  Marland Kitchen CATARACT EXTRACTION W/ INTRAOCULAR LENS  IMPLANT, BILATERAL Bilateral ~ 2006/2007  . CORONARY ANGIOPLASTY WITH STENT PLACEMENT  04/28/2011; 02/20/2015   PCI-Cx-OM 3 overlapping Resolute DES 3 mm x32mm and 3x36mm --> 3.73mm; PCI to RCA - Promus Element DES 3x102mm --> 3.45mm;; b) dRCA-rPAV: Promus Premier DES 2.4mm X 28 mm (3.0 mm)   . CORONARY ARTERY BYPASS GRAFT   07/23/2004   LIMA-Cx RIMA-LAD, SVG-diagonal, SVG-PDA  . RIGHT/LEFT HEART CATH AND CORONARY/GRAFT ANGIOGRAPHY N/A 01/09/2018   Procedure: RIGHT/LEFT HEART CATH AND CORONARY/GRAFT ANGIOGRAPHY;  Surgeon: [name] Man, MD;; Known occluded LIMA-Diag & SVG-RCA, SVG-OM.  Mod-severe pLAD that is grafted after D1 - patent RIMA- wraparound LAD, distal 1/2 of PDA territory. Patent Cx-OM DES (ost Cx mild) & p-mRCA overlapping DES & dRCA-RPAV DES.  PCWP 12 mmHg, LVP-EDP: 140/6 - 14 mmH. RAP 2 mmHg. RVP-EDP 33/0 - 5  mmHg  . TRANSTHORACIC ECHOCARDIOGRAM  02/18/2015    Mild concentric LVH, EF 55-60%, no RWMA, Gr 1 DD, aortic sclerosis without stenosis  . TRANSURETHRAL RESECTION OF PROSTATE  ~ 2002/2003     Current Outpatient Medications  Medication Sig Dispense Refill  . acetaminophen (TYLENOL) 500 MG tablet Take 1,000 mg by mouth daily as needed for moderate pain.    Marland Kitchen ALPRAZolam (XANAX) 0.5 MG tablet Take 0.5 mg by mouth as directed.    Marland Kitchen aspirin EC 81 MG tablet Take 81 mg by mouth as directed.    Marland Kitchen BREO ELLIPTA 100-25 MCG/INH AEPB Inhale 1 puff into the lungs daily.    . budesonide-formoterol (SYMBICORT) 80-4.5 MCG/ACT inhaler Inhale 2 puffs into the lungs every 4 (four) hours as needed (shortness of breath).    . carvedilol (COREG) 3.125 MG tablet Take 3.125 mg by mouth daily. Half tablet daily    . chlorzoxazone (PARAFON) 500 MG tablet Take 500 mg by mouth daily as needed for muscle spasms.    . mometasone (NASONEX) 50 MCG/ACT nasal spray Place 2 sprays into the nose daily as needed (congestion).    . nystatin cream (MYCOSTATIN) Apply 1 application topically as needed for rash.    . nystatin-triamcinolone (MYCOLOG II) cream Apply 1 application topically daily as needed (yeast infections).    Vladimir Faster Glycol-Propyl Glycol (SYSTANE OP) Place 1 drop into both eyes 2 (two) times daily.    . rosuvastatin (CRESTOR) 5 MG tablet Take 5 mg by mouth every evening.     . triamcinolone cream (KENALOG) 0.1 %  Apply 1 application topically as needed.    . furosemide (LASIX) 20 MG tablet Take 1 tablet (20 mg total) by mouth as needed for edema. 90 tablet 0   No current facility-administered medications for this visit.     Allergies:   Meperidine hcl and Penicillins   Social History:  The patient  reports that he quit smoking about 55 years ago. His smoking use included cigarettes. He has a 7.00 pack-year smoking history. He has never used smokeless tobacco. He reports that he does not drink alcohol or use drugs.   Family History:  The patient's family history includes Colon cancer in his brother; Heart disease in his brother; Heart disease (age of onset: 44) in his father; Hypertension (age of onset: 28) in his brother; Hypertension (age of onset: 40) in his mother;  Stomach cancer in his sister; Stroke (age of onset: 55) in his mother.    ROS:  Please see the history of present illness.   Otherwise, review of systems is positive for none.   All other systems are reviewed and negative.    PHYSICAL EXAM: VS:  BP (!) 142/86   Pulse 83   Ht 5\' 8"  (1.727 m)   Wt 193 lb 9.6 oz (87.8 kg)   SpO2 95%   BMI 29.44 kg/m  , BMI Body mass index is 29.44 kg/m. GEN: Well nourished, well developed, in no acute distress  HEENT: normal  Neck: no JVD, carotid bruits, or masses Cardiac: RRR; no murmurs, rubs, or gallops,no edema  Respiratory:  clear to auscultation bilaterally, normal work of breathing GI: soft, nontender, nondistended, + BS MS: no deformity or atrophy  Skin: warm and dry Neuro:  Strength and sensation are intact Psych: euthymic mood, full affect  EKG:  EKG is not ordered today. Personal review of the ekg ordered 06/20/19 shows sinus rhythm, left bundle branch block   Recent Labs: No results found for requested labs within last 8760 hours.    Lipid Panel     Component Value Date/Time   CHOL 173 01/05/2018 1415   TRIG 144 01/05/2018 1415   HDL 50 01/05/2018 1415   CHOLHDL 3.5  01/05/2018 1415   CHOLHDL 3.3 08/05/2014 0844   VLDL 19 08/05/2014 0844   LDLCALC 94 01/05/2018 1415     Wt Readings from Last 3 Encounters:  07/08/19 193 lb 9.6 oz (87.8 kg)  06/20/19 192 lb (87.1 kg)  08/20/18 191 lb 12.8 oz (87 kg)      Other studies Reviewed: Additional studies/ records that were reviewed today include: TTE 06/25/19  Review of the above records today demonstrates:  1. Left ventricular ejection fraction, by visual estimation, is 30 to 35%. The left ventricle has moderate to severely decreased function. Normal left ventricular size. There is mildly increased left ventricular hypertrophy.  2. Abnormal septal motion consistent with left bundle branch block.  3. Left ventricular diastolic Doppler parameters are consistent with pseudonormalization pattern of LV diastolic filling.  4. Elevated mean left atrial pressure.  5. Global right ventricle has normal systolic function.The right ventricular size is normal. No increase in right ventricular wall thickness.  6. Left atrial size was normal.  7. Right atrial size was normal.  8. The mitral valve is normal in structure. No evidence of mitral valve regurgitation.  9. The tricuspid valve is normal in structure. Tricuspid valve regurgitation is trivial. 10. The aortic valve is tricuspid Aortic valve regurgitation was not visualized by color flow Doppler. 11. The pulmonic valve was not well visualized. Pulmonic valve regurgitation is trivial by color flow Doppler. 12. The inferior vena cava is normal in size with greater than 50% respiratory variability, suggesting right atrial pressure of 3 mmHg.  [place] 01/09/18  Previously placed Ost RCA to Mid RCA stent (DES) is widely patent. Previously placed Dist RCA- Post Atrio stent (DES) is widely patent.  Previously placed Prox Cx to Dist Cx stent (DES) is widely patent.  Ost LAD lesion is 80% stenosed. Prox LAD lesion is 60% stenosed. Ost 2nd Diag lesion is 85% stenosed.   _______________________________________________________________________________________________  RIMA graft was visualized by angiography and is large. The graft exhibits no disease. --It feels a wraparound LAD that perfuses the distal/apical half of the PDA.  SVG-RPDA graft was not injected. Origin lesion is 100% stenosed. Known CTO  SVG-OM  graft was visualized by angiography. Origin to Prox Graft lesion is 100% stenosed. Known CTO  __________________________________________________________________________________________  LIMA-DIAG: (Not Injected) Origin to Prox Graft lesion is 100% stenosed. Known CTO  There is mild to moderate left ventricular systolic dysfunction. The left ventricular ejection fraction is 35-45% by visual estimate.  Normal right heart cath pressures with normal cardiac output/index.  LV end diastolic pressure is normal.  ASSESSMENT AND PLAN:  1.  Coronary artery disease: Status post CABG and PCI.  No current chest pain.  Plan follow-up with primary cardiologist.    2.  Chronic systolic heart failure: Ejection fraction 30 to 35% with a left bundle branch block.  He does have a left bundle branch block and would likely benefit from CRT D.  He is on carvedilol, but is not on any further optimal medical therapy.  I Ragnar Waas discuss this with his primary cardiologist.  If there is a reason to hold off on optimal medical therapy prior to device implant, Tasia Liz likely plan for ICD implant.  Risks and benefits were discussed include bleeding, tamponade, infection, pneumothorax.  3.  Hypertension: Mildly elevated.  He Karmello Abercrombie likely need further blood pressure therapy and heart failure therapy.  4.  PVCs: None recent.  Current medicines are reviewed at length with the patient today.   The patient does not have concerns regarding his medicines.  The following changes were made today:  none  Labs/ tests ordered today include:  No orders of the defined types were placed in this  encounter.  Case discussed with referring cardiology  Disposition:   FU with Lauralye Kinn 3 months  Signed, Anali Cabanilla Meredith Leeds, MD  07/08/2019 1:40 PM     [address] [place] [id number] [phone] (office) [phone] (fax)

## 2019-07-14 DIAGNOSIS — Z23 Encounter for immunization: Secondary | ICD-10-CM | POA: Diagnosis not present

## 2019-07-17 ENCOUNTER — Telehealth: Payer: Self-pay | Admitting: *Deleted

## 2019-07-17 NOTE — Telephone Encounter (Signed)
Informed pt that Dr. [name] recommends starting [facility].  Pt made aware that insurance potentially may not cover device implant unless on optimal medical therapy for 3 months. Pt agreeable to starting medication. Aware that I will call him tomorrow, as I working remotely today, to see about samples. Pt agreeable to plan.

## 2019-07-22 DIAGNOSIS — N200 Calculus of kidney: Secondary | ICD-10-CM | POA: Diagnosis not present

## 2019-07-22 DIAGNOSIS — N281 Cyst of kidney, acquired: Secondary | ICD-10-CM | POA: Diagnosis not present

## 2019-07-22 DIAGNOSIS — N401 Enlarged prostate with lower urinary tract symptoms: Secondary | ICD-10-CM | POA: Diagnosis not present

## 2019-07-22 DIAGNOSIS — N35013 Post-traumatic anterior urethral stricture: Secondary | ICD-10-CM | POA: Diagnosis not present

## 2019-07-24 ENCOUNTER — Telehealth: Payer: Self-pay | Admitting: Cardiology

## 2019-07-24 MED ORDER — ENTRESTO 24-26 MG PO TABS: 1.0000 | ORAL_TABLET | Freq: Two times a day (BID) | ORAL | 3 refills | Status: DC

## 2019-07-24 MED ORDER — CARVEDILOL 3.125 MG PO TABS: ORAL_TABLET | ORAL | 3 refills | Status: DC

## 2019-07-24 NOTE — Telephone Encounter (Signed)
Apologized for the delayed return call. Informed that we don't carry samples in office anymore.  Advised to stop by the [place] office to pick up [facility] card.  Advised to call me ASAP if cost is an issue so that we may work on that while he has the 30 day free supply. Advised to call office is SE begin after medication start. Informed that I would follow up in several weeks to determine how he is doing and titrate medication if stable. Patient verbalized understanding and agreeable to plan.

## 2019-07-24 NOTE — Telephone Encounter (Signed)
Spoke to patient he stated he wanted Dr.Harding to know Coreg 3.125 mg 1/2 tablet daily is working. 90 day prescription sent to pharmacy.He stated he saw Dr.Camnitz and he is starting him on Entresto.Advised I will send message to Dr.Harding to make him aware.

## 2019-07-24 NOTE — Telephone Encounter (Signed)
New Message   Pt c/o medication issue:  1. Name of Medication: carvedilol (COREG) 3.125 MG tablet    2. How are you currently taking this medication (dosage and times per day)? Take 3.125 mg by mouth daily. Half tablet daily  3. Are you having a reaction (difficulty breathing--STAT)?   4. What is your medication issue? Patient is calling because the Coreg is working and wanted Dr. Ellyn Hack to know. He states that [facility] [place] [id number] [place] [place] if Dr. Ellyn Hack would like for him to continue on the medication. Please advise.

## 2019-07-25 NOTE — Telephone Encounter (Signed)
Sounds good.  We will see how he does with Entresto.  [name], MD

## 2019-08-23 ENCOUNTER — Telehealth: Payer: Self-pay | Admitting: *Deleted

## 2019-08-23 NOTE — Telephone Encounter (Signed)
Not unexpectedly, he had symptoms with starting Entresto.  Not sure if this means that he would not be a candidate for ICD, because he is intolerant of any medications.  Defer that question to Dr. [name].  [name], MD

## 2019-08-23 NOTE — Telephone Encounter (Signed)
Followed up with pt about how he was doing on Entresto He tells me he isn't doing well since starting the medication. Reports he started it on 11/21.  A few days after starting he began to experience "facial itching", and this remains an issue. Reports BPs:  11/21 5pm 102/60, HR 82  8pm 116/56, HR 73 11/23 6pm 94/65, HR 98  11/25 1020 140/90, HR 75  3pm 104/76, HR 97 11/30  98/69, HR 87 12/15 4pm 107/60, HR 84  8pm 118/59, HR 70 12/17 12pm 130/73, HR 64  9pm 108/58, HR 79 This morning 105/68, HR 72. Complains of dizziness, fatigue, itching. Pt educated to call MD if SE begin after starting any medication in the future. Reviewed w/ Dr. [name].  Pt advised to stop Entresto. Dr. [name] recommends pt f/u w/ Dr. Ellyn Hack to determine treatment plan going forward.  Made aware that Dr. Ellyn Hack will refer back to Dr. [name] at later date to discuss ICD, if/when needed. Patient verbalized understanding and agreeable to plan.  Will forward to Dr. Ellyn Hack and his nurse to arrange OV to discuss further.

## 2019-08-28 NOTE — Telephone Encounter (Signed)
Yeah - not many options for him.    - would potentially benefit from CRT - but I get it.  He doesn't make it easy  [facility]

## 2019-08-28 NOTE — Telephone Encounter (Signed)
It does appear that he unfortunately is unable to tolerate any medicines.  I am not sure if that is because he has it in his head that he did not tolerate MRA actually has side effects.  He is almost 79, and not being on medications, I am not sure how much benefit he would get out of an ICD.  We generally do not implant after age 79.  I think is probably smart hold off at this time.  To me know if you have any other thoughts.  Thanks.

## 2019-09-02 NOTE — Telephone Encounter (Signed)
k thnx

## 2019-09-19 ENCOUNTER — Ambulatory Visit (INDEPENDENT_AMBULATORY_CARE_PROVIDER_SITE_OTHER): Payer: Medicare Other | Admitting: Cardiology

## 2019-09-19 ENCOUNTER — Other Ambulatory Visit: Payer: Self-pay

## 2019-09-19 ENCOUNTER — Encounter: Payer: Self-pay | Admitting: Cardiology

## 2019-09-19 VITALS — BP 148/81 | HR 82 | Temp 97.2°F | Ht 68.0 in | Wt 191.6 lb

## 2019-09-19 DIAGNOSIS — Z9861 Coronary angioplasty status: Secondary | ICD-10-CM

## 2019-09-19 DIAGNOSIS — I5042 Chronic combined systolic (congestive) and diastolic (congestive) heart failure: Secondary | ICD-10-CM | POA: Diagnosis not present

## 2019-09-19 DIAGNOSIS — I2510 Atherosclerotic heart disease of native coronary artery without angina pectoris: Secondary | ICD-10-CM | POA: Diagnosis not present

## 2019-09-19 DIAGNOSIS — I951 Orthostatic hypotension: Secondary | ICD-10-CM

## 2019-09-19 DIAGNOSIS — I25708 Atherosclerosis of coronary artery bypass graft(s), unspecified, with other forms of angina pectoris: Secondary | ICD-10-CM

## 2019-09-19 DIAGNOSIS — I447 Left bundle-branch block, unspecified: Secondary | ICD-10-CM

## 2019-09-19 DIAGNOSIS — I420 Dilated cardiomyopathy: Secondary | ICD-10-CM

## 2019-09-19 DIAGNOSIS — E785 Hyperlipidemia, unspecified: Secondary | ICD-10-CM

## 2019-09-19 DIAGNOSIS — R600 Localized edema: Secondary | ICD-10-CM

## 2019-09-19 MED ORDER — CARVEDILOL 3.125 MG PO TABS: 3.1250 mg | ORAL_TABLET | Freq: Two times a day (BID) | ORAL | 3 refills | Status: DC

## 2019-09-19 NOTE — Patient Instructions (Signed)
Medication Instructions:   try an take Lasix at leat 3 times a week , if not everyday.   instruction for [employer]  With 1/2 tablet  Twice a day for 2 weeks then increase to 1/2 tablet in the morning and whole tablet in the evening  For 2 weeks then increase to  Whole tablet twice a day   *If you need a refill on your cardiac medications before your next appointment, please call your pharmacy*  Lab Work: BMP  - 2 weeks  If you have labs (blood work) drawn today and your tests are completely normal, you will receive your results only by: Marland Kitchen MyChart Message (if you have MyChart) OR . A paper copy in the mail If you have any lab test that is abnormal or we need to change your treatment, we will call you to review the results.  Testing/Procedures: Will be schedule at [address]- end of March 2021 Your physician has requested that you have an echocardiogram. Echocardiography is a painless test that uses sound waves to create images of your heart. It provides your doctor with information about the size and shape of your heart and how well your heart's chambers and valves are working. This procedure takes approximately one hour. There are no restrictions for this procedure.    Follow-Up: At [facility], you and your health needs are our priority.  As part of our continuing mission to provide you with exceptional heart care, we have created designated Provider Care Teams.  These Care Teams include your primary Cardiologist (physician) and Advanced Practice Providers (APPs -  Physician Assistants and Nurse Practitioners) who all work together to provide you with the care you need, when you need it.  Your next appointment:   3 month(s)  The format for your next appointment:   In Person  Provider:   [name], MD  Other Instructions

## 2019-09-19 NOTE — Progress Notes (Signed)
Primary Care Provider: [name], MD Cardiologist: [name], MD Electrophysiologist: Dr. Allegra Lai  Clinic Note: Chief Complaint  Patient presents with  . Follow-up    Has questions about medications and ICD etc.  . Cardiomyopathy    Actually doing well from a CHF standpoint.  Has been tolerating carvedilol and for the most part Entresto since November  . Coronary Artery Disease    No angina    HPI:    [name] is a 79 y.o. male with a complicated cardiac history noted below who presents today for 3 month f/u .   CAD->CABG 07/2004 (Sx = fatigue & PVCs) --> CATH -> MV CAD --> CABG X 4 (RIMA-LAD, LIMA-Diag, SVG-OM, SVG-RCA)  04/2011 - recurrent Sx -> Myoview + for Inf Ischemia --> Cath => Only RIMA-LAD patent --> DES PCI of Native Cx-OM (2 overlapping Promus DES) & Native RCA  June 2016 - DES PCI of the dRCA-RPAV. Since then he has done very well with no active anginal symptoms. 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  Relook Cath MAY 2019 - patent stents &RIMA-LAD.EF estimated 35 to 45%. Normal right heart cath numbers. PCWP 12 mmHg. LV EDP 14 mmHg  Most recent Echo October 2020: EF 30-35%.  [name] was last seen back in October 2020 noting that he was just not recovering as well as he was hoping with his cardiomyopathy.  He became much more concerned because his son was actually recently diagnosed with valvular cardiomyopathy. -> Was out of his routine because of COVID-19 and never getting back to the gym.  Becoming more sedentary.  His blood pressure been going up because he has held his beta-blocker.  We switch him over to Bystolic, but he is decided to stop this.  Follow-up echocardiogram ordered  Started on carvedilol (converted from Bystolic) -> hope to start at 1/2 tablet twice daily with  plans to potentially start Entresto if tolerated  Referred for EP evaluation for possible CRT-D.   --> Seen on November 2 by Dr. [name].  Discussed the importance of being on optimal medical management.  Plan was to start [facility], but felt that CRT-D would be a great option for him to be on maximum tolerated medications.  Started on [place] late in November  Recent Hospitalizations: none  Reviewed  CV studies:    The following studies were reviewed today: (if available, images/films reviewed: From Epic Chart or Care Everywhere) . Echo 06/25/19: EF 30-35% with moderate to severe reduced function.  Abnormal septal motion with LBBB.  GRII DD.  Elevated LAP, however left atrial sizes read as normal.  Slight decrease in EF from April 2019  Interval History:   [place] returns here today a little bit unsure about what is happening when it comes to the distal defibrillator concept.  He is very much in favor of the concept of the CRT pacemaking and the and then the background of having a defibrillator.  He is now committed that this is the best option for him and his working to toward try to tolerate medications.  He did not do well with bisoprolol, but it seems to be tolerating carvedilol currently taking twice daily dosing 1/2 tablet which he will try to titrate to full tablet twice daily.  He initially did start Entresto, but had to hold for the last week  because he was having some dizziness.  As is usually the case, he has a really hard time taking full doses of medications and he was not sure what the neck step to do is.  But he is determined to meet requirements for reaching target of CRT-D.  He said that bisoprolol was not taking care of his blood pressure so he is probably can be able tolerate the twice daily 3.125 mg of carvedilol and is willing to restart Delene Loll now that he is feeling better.  He does still have a little orthopnea but no real edema on current dose of Lasix which he is  taking maybe 2 days a week.  He has had a little improvement in his exertional dyspnea with better blood pressure control.  He was just a little worried because when he started taking the [facility], he showed me some of the blood pressure recordings down in the low 0000000 systolic.  This made him concerned because he was somewhat lightheaded and dizzy.  Thankfully, he is not really having any of the PVCs or ectopy that usually are his anginal equivalent.  CV Review of Symptoms (Summary): positive for - dyspnea on exertion, orthopnea and Some issues with dizziness on higher dose of medications, but better than before. negative for - chest pain, irregular heartbeat, palpitations, paroxysmal nocturnal dyspnea, rapid heart rate, shortness of breath or Syncope/near syncope, TIA/amaurosis fugax, claudication  The patient does not have symptoms concerning for COVID-19 infection (fever, chills, cough, or new shortness of breath).  The patient is practicing social distancing & Masking.  He is very careful when he goes out for groceries, usually goes during later hours and wear his mask.   REVIEWED OF SYSTEMS   A comprehensive ROS was performed. Review of Systems  Constitutional: Positive for malaise/fatigue (But this is improving). Negative for weight loss (He is actually gaining weight).  HENT: Negative for congestion and nosebleeds.   Respiratory: Negative for shortness of breath and wheezing.   Gastrointestinal: Negative for abdominal pain, blood in stool, heartburn and melena.  Genitourinary: Negative for hematuria.  Musculoskeletal: Negative for falls and joint pain.  Neurological: Positive for dizziness (When his blood pressure started going low.). Negative for focal weakness and weakness.  Endo/Heme/Allergies: Positive for environmental allergies.  Psychiatric/Behavioral: Negative for depression and memory loss. The patient is not nervous/anxious and does not have insomnia.    I have reviewed and  (if needed) personally updated the patient's problem list, medications, allergies, past medical and surgical history, social and family history.   PAST MEDICAL HISTORY   Past Medical History:  Diagnosis Date  . Allergic rhinitis   . Arthritis    "entire body" (02/19/2015)  . Asthma   . Asthmatic bronchitis   . CAD of autologous vein bypass graft without angina 04/2011   Frequent PVCs & fatigue -> MYOVIEW w/ inferior /r MV distribution --> CATH: only RIMA-LAD patent (LIMA atretic & SVGs occluded) --> Staged PCI of Native RCA & Cx; followed by PCI-distal RCA  . CAD S/P percutaneous coronary angioplasty 8/'12; 06/'16   a) PCI- Cx-OM W/ 2 overlapping Resolute DES 3x15 & 3x18 stents (3.87mm),  PCI-RCA -  Promus DES 3x38 (3.41mm);; b) 6/'16: PCI-dRCA-rPAV Promus DES 2.75X28 (3.0 mm) -- stents patent in 01/2018;  01/2018 -stents remain patent  . CAS (cerebral atherosclerosis)    CAROTID DOPPLER, 08/25/2011 - mildly abnormal  . Chronic back pain   . Dilated cardiomyopathy ([place]): EF ~35%, 01/05/2018   TTE April 2019: EF~35%  with diffuse HK - septal and lateral walls.  Mildly dilated LV.  "GR 1 "DD with high filling pressures. ?  Left atrium normal size --> new LBBB - no change to Coronary Anatony on Cath; -> EF now 30 to 35% as of October 2020  . Diverticulosis   . Erectile dysfunction   . Essential hypertension    Medication intolerant -- only able to take minimal doses of medications  . Family hx of colon cancer   . GERD (gastroesophageal reflux disease)   . Gout   . Hemorrhoids   . Hyperlipidemia with target LDL less than 70    Only able to tolerate very low dose statin - no interest in other options  . Kidney stones   . Myocardial infarction ([place]) 2005  . Polycythemia   . 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 Symptomatic PVCs   . Syncope 2010   ? unclear of etiology     PAST SURGICAL  HISTORY   Past Surgical History:  Procedure Laterality Date  . CARDIAC CATHETERIZATION  07/15/2004   CABG recommended; continue medical therapy  . CARDIAC CATHETERIZATION  04/26/2011   Patent RIMA-LAD, atretic LIMA-Dx (previous) 100 and CTO SVG to Cx and SVG to PDA. LAD 80-90% eccentric stenosis at SP1 that has 70% stenosis. Cx: Large OM and another distal branch with 90-95% stenosis followed by lesion in the OM branch. RCA dominant 60-80% lesion proximally w/ several old lesions. PDA occluded 70-80% annular lesion in the proximal RCA. 3 of 4 grafts CTO RIMA-LAD patent  . CARDIAC CATHETERIZATION N/A 02/20/2015   Procedure: Left Heart Cath and Coronary Angiography;  Surgeon: Leonie Man, MD;  Location: [place] CV LAB: Widely patent p-mRCA DES with dRCA 50%-PAVG 80% --> PCI. Patent overlapping stents --mCx-OM. 99% pLAD - patent RIMA-dLAD.  Known CTO SVG-rPL, SVG-OM3, :LIMA-D1.  Marland Kitchen CATARACT EXTRACTION W/ INTRAOCULAR LENS  IMPLANT, BILATERAL Bilateral ~ 2006/2007  . CORONARY ANGIOPLASTY WITH STENT PLACEMENT  04/28/2011; 02/20/2015   PCI-Cx-OM 3 overlapping Resolute DES 3 mm x53mm and 3x45mm --> 3.58mm; PCI to RCA - Promus Element DES 3x48mm --> 3.74mm;; b) dRCA-rPAV: Promus Premier DES 2.82mm X 28 mm (3.0 mm)   . CORONARY ARTERY BYPASS GRAFT  07/23/2004   LIMA-Cx RIMA-LAD, SVG-diagonal, SVG-PDA  . RIGHT/LEFT HEART CATH AND CORONARY/GRAFT ANGIOGRAPHY N/A 01/09/2018   Procedure: RIGHT/LEFT HEART CATH AND CORONARY/GRAFT ANGIOGRAPHY;  Surgeon: Leonie Man, MD;; Known occluded LIMA-Diag & SVG-RCA, SVG-OM.  Mod-severe pLAD that is grafted after D1 - patent RIMA- wraparound LAD, distal 1/2 of PDA territory. Patent Cx-OM DES (ost Cx mild) & p-mRCA overlapping DES & dRCA-RPAV DES.  PCWP 12 mmHg, LVP-EDP: 140/6 - 14 mmH. RAP 2 mmHg. RVP-EDP 33/0 - 5  mmHg  . TRANSTHORACIC ECHOCARDIOGRAM  02/18/2015    Mild concentric LVH, EF 55-60%, no RWMA, Gr 1 DD, aortic sclerosis without stenosis  . TRANSTHORACIC  ECHOCARDIOGRAM  12/2017; 06/2019   (New LBBB)  EF~35% with diffuse HK of septal & lateral walls.  Mildly dilated LV.  "GR 1 "DD with high filling pressures. ? w/ normal LA size; 10/20: EF 30-35%, LBBB related Septal dyskinesis, GR2 DD (but normal LA size).   . TRANSURETHRAL RESECTION OF PROSTATE  ~ 2002/2003    TTE April 2019: EF~35% with diffuse hypokinesis and septal and lateral walls.  Mildly dilated LV.  "GR 1 "DD with high  filling pressures. ?  Left atrium normal size  RLCP 01/2018:  LIMA-Diag, SVG-RCA, SVG-OM known CTO.   Patent RIMA-LAD (severe native LAD disease prior to D1 & D2 both with extensive disease) - retrograde fills to CTO &antegrade wrap-around LAD (distal 1/2 of PDA).   DES in LCx-OM & p-m RCA & dRCA-RPAV widely patent with patent RPDA.   Normal RHC #s: PCWP / LVEDP ~12-14 mmHg. RAP & RVEDP ~0-2 mmHg.    MEDICATIONS/ALLERGIES   Current Meds  Medication Sig  . ALPRAZolam (XANAX) 0.5 MG tablet Take 0.5 mg by mouth as directed.  Marland Kitchen aspirin EC 81 MG tablet Take 81 mg by mouth as directed.  Marland Kitchen BREO ELLIPTA 100-25 MCG/INH AEPB Inhale 1 puff into the lungs daily.  . budesonide-formoterol (SYMBICORT) 80-4.5 MCG/ACT inhaler Inhale 2 puffs into the lungs every 4 (four) hours as needed (shortness of breath).  . carvedilol (COREG) 3.125 MG tablet Take 1 tablet (3.125 mg total) by mouth 2 (two) times daily with a meal.  . chlorzoxazone (PARAFON) 500 MG tablet Take 500 mg by mouth daily as needed for muscle spasms.  . furosemide (LASIX) 20 MG tablet Take 1 tablet (20 mg total) by mouth as needed for edema.  . mometasone (NASONEX) 50 MCG/ACT nasal spray Place 2 sprays into the nose daily as needed (congestion).  . nystatin cream (MYCOSTATIN) Apply 1 application topically as needed for rash.  . nystatin-triamcinolone (MYCOLOG II) cream Apply 1 application topically daily as needed (yeast infections).  Vladimir Faster Glycol-Propyl Glycol (SYSTANE OP) Place 1 drop into both eyes 2 (two)  times daily.  . rosuvastatin (CRESTOR) 5 MG tablet Take 5 mg by mouth every evening.   . sacubitril-valsartan (ENTRESTO) 24-26 MG Take 1 tablet by mouth 2 (two) times daily.  Marland Kitchen triamcinolone cream (KENALOG) 0.1 % Apply 1 application topically as needed.  . [DISCONTINUED] carvedilol (COREG) 3.125 MG tablet Take 1/2 tablet daily  . [DISCONTINUED] carvedilol (COREG) 3.125 MG tablet Take 3.125 mg by mouth 2 (two) times daily with a meal.  -Since his last appointment with Dr. [name], he has adjusted his carvedilol to taking 3.125 mg 1/2 tablet twice daily, and up until the first week of January he was taking Entresto 24/26 1 tab twice daily, but has had some dizziness spells, therefore has been held for the last week or so -> he was waiting until this appointment to restart.  Allergies  Allergen Reactions  . Meperidine Hcl     Cold sweats, dizziness   . Penicillins     Severe headaches Has patient had a PCN reaction causing immediate rash, facial/tongue/throat swelling, SOB or lightheadedness with hypotension: No Has patient had a PCN reaction causing severe rash involving mucus membranes or skin necrosis: No Has patient had a PCN reaction that required hospitalization: No Has patient had a PCN reaction occurring within the last 10 years: No If all of the above answers are "NO", then may proceed with Cephalosporin use.      SOCIAL HISTORY/FAMILY HISTORY   Social History   Tobacco Use  . Smoking status: Former Smoker    Packs/day: 1.00    Years: 7.00    Pack years: 7.00    Types: Cigarettes    Quit date: 09/06/1963    Years since quitting: 56.0  . Smokeless tobacco: Never Used  Substance Use Topics  . Alcohol use: No  . Drug use: No   Social History   Social History Narrative   He is made to patient  of mine. He works for [employer], as a Engineer, manufacturing systems. He is a former smoker but quit in 1965 after 7 years. He does not drink alcohol.   He is very busy with work, and does  not get routine exercise. He plans to work maybe one more year and then will retire. 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    Family History family history includes Colon cancer in his brother; Heart disease in his brother; Heart disease (age of onset: 2) in his father; Hypertension (age of onset: 33) in his brother; Hypertension (age of onset: 52) in his mother; Stomach cancer in his sister; Stroke (age of onset: 61) in his mother.   OBJCTIVE -PE, EKG, labs   Wt Readings from Last 3 Encounters:  09/19/19 191 lb 9.6 oz (86.9 kg)  07/08/19 193 lb 9.6 oz (87.8 kg)  06/20/19 192 lb (87.1 kg)    Physical Exam: BP (!) 148/81   Pulse 82   Temp (!) 97.2 F (36.2 C)   Ht 5\' 8"  (1.727 m)   Wt 191 lb 9.6 oz (86.9 kg)   SpO2 98%   BMI 29.13 kg/m  Physical Exam  Constitutional: He is oriented to person, place, and time. He appears well-developed and well-nourished. No distress.  Well-groomed.  Pleasant.  Looks like he has gained some weight, but otherwise healthy  HENT:  Head: Normocephalic and atraumatic.  Neck: No hepatojugular reflux and no JVD (Minimally elevated-7-8 cmH2O.) present. Carotid bruit is not present.  Cardiovascular: Normal rate, regular rhythm, S1 normal, S2 normal and intact distal pulses.  Occasional extrasystoles are present. PMI is not displaced (Mildly displaced, but not on sustained). Exam reveals gallop, S4 (Soft) and distant heart sounds. Exam reveals no friction rub.  Murmur (Soft 1/6 SEM at RUSB.) heard. Pulmonary/Chest: Effort normal and breath sounds normal. No respiratory distress. He has no wheezes.  Abdominal: Soft. Bowel sounds are normal. He exhibits no distension. There is no abdominal tenderness. There is no rebound.  Musculoskeletal:        General: No edema. Normal range of motion.     Cervical back: Normal range of motion and neck supple.  Neurological: He is alert and  oriented to person, place, and time.  Psychiatric: He has a normal mood and affect. His behavior is normal. Judgment and thought content normal.  Actually in a relatively positive state of mind today.  Vitals reviewed.   Adult ECG Report  Rate: 82 ;  Rhythm: normal sinus rhythm and Normal axis, intervals and durations.  LBBB.;   Narrative Interpretation:Stable EKG  Recent Labs: Last checked in July 2020: TC 218, TG 144, HDL 47, LDL 94.  ALT A1c 6.2.  Hgb 15.6.  CR 0.95. Lab Results  Component Value Date   CHOL 173 01/05/2018   HDL 50 01/05/2018   LDLCALC 94 01/05/2018   TRIG 144 01/05/2018   CHOLHDL 3.5 01/05/2018   Lab Results  Component Value Date   CREATININE 0.85 01/05/2018   BUN 9 01/05/2018   NA 140 01/05/2018   K 4.6 01/05/2018   CL 99 01/05/2018   CO2 28 01/05/2018    ASSESSMENT/PLAN    Problem List Items Addressed This Visit    CAD (coronary artery disease) of artery bypass graft -atretic LIMA- circumflex, occluded SVG-RCA and SVG-diagonal. (Chronic)    All but is RIMA-LAD or occluded as far grafts ago.  Now has stent  revascularization of the RCA and circumflex.  No active anginal symptoms which for him was frequent PVCs and fatigue.      Relevant Medications   carvedilol (COREG) 3.125 MG tablet   CAD S/P  PCI of Cx-OM w/ 2 Resolute DES 3.0 x 15 & 3.0 x 18 (3.17mm); PCI-pRCA: Promus Element DES 3.0 x 38 (3.8mm); dRCA-rPAV: Promus Premier DES 2.75x28 (3 mm) - Primary (Chronic)    Basically he has RIMA to LAD and then completely revascularized native RCA and circumflex territory.  Remains on aspirin alone because of concern for bleeding issues on Plavix.  Is tolerating 5 mg of rosuvastatin which is about the most has been able tolerate.  Hopefully as inclisiran option becomes available he may be willing to try that.  For now he has restarted carvedilol and is going to titrate up his Entresto dose.      Relevant Medications   carvedilol (COREG) 3.125 MG tablet    Other Relevant Orders   EKG 12-Lead (Completed)   ECHOCARDIOGRAM COMPLETE   Basic metabolic panel   Complete left bundle branch block (LBBB) (Chronic)    It really seems that his drop in EF has been related to his left bundle branch block.  Clearly his proximal LAD ischemia has something to do that but is not really a great PCI option.  He has not had any anginal symptoms either.  I hope that he is able to tolerate carvedilol and Entresto for the remainder this next 2 months.  To see if he still would potentially meet criteria for CRT-D versus CRT-P.      Relevant Medications   carvedilol (COREG) 3.125 MG tablet   Other Relevant Orders   EKG 12-Lead (Completed)   ECHOCARDIOGRAM COMPLETE   Basic metabolic panel   Dilated cardiomyopathy ([place]): EF ~35%, new LBBB - no change to Coronary Anatony on Cath (Chronic)    Surely there is an ischemic component with microvascular ischemia, but I think left bundle branch block is the most responsible for the drop in EF.  He has had a hard time tolerating the medication that would try to have him on for his cardiomyopathy, but now that we have discussed the possibility of CRT-D, he understands the importance of making the efforts and understands the benefits of these medications.  He has done a great job over the last month and a half, only stopping the [facility] for the last week or so because of justified concern with hypotension.  He is willing to restart now, because in fact his blood pressures are going up..      Relevant Medications   carvedilol (COREG) 3.125 MG tablet   Other Relevant Orders   EKG 12-Lead (Completed)   ECHOCARDIOGRAM COMPLETE   Basic metabolic panel   Hyperlipidemia with target LDL less than 70 (Chronic)   Relevant Medications   carvedilol (COREG) 3.125 MG tablet   Bilateral lower extremity edema (Chronic)    Doing well.  Recommend taking Lasix at least 3 days a week.      Chronic combined systolic and diastolic heart  failure ([place])    Thankfully, he really is having may be class II with no symptoms.  He is doing okay with intermittent dosing of Lasix, but I think he may benefit from being on a standing dose at least 3 days a week.  Plan for now is to try to titrate back to a full tablet of carvedilol 3.125 mg twice daily and then we will start  him back on the [facility] but started 1/2 tablet twice daily and gradually tied titrated up to full tablet each week or so.  He is determined to make this adjustment, and indeed has been making the effort on the beta-blocker and Entresto since late November.  We will recheck an echocardiogram in the end of March to see if there is any improvement.  Depending what that shows, would probably consider discussed with Dr. [name] potential timing for either CRT-D or to simple CRT-P      Relevant Medications   carvedilol (COREG) 3.125 MG tablet   Other Relevant Orders   EKG 12-Lead (Completed)   ECHOCARDIOGRAM COMPLETE   Basic metabolic panel   Orthostatic hypotension - with tremor    This is been the most concerning issue in the past is that he has had hypotension.  Thankfully though it appears that his pressures are stabilized out a little bit and he may very well be uptitrated up his carvedilol and Entresto doses.  I do not see him yet to get beyond 3.125 mg twice daily of carvedilol along with starting dose Entresto twice daily.  That would be the most of any blood pressure medicines have had on him for at least 10 years.      Relevant Medications   carvedilol (COREG) 3.125 MG tablet       COVID-19 Education: The signs and symptoms of COVID-19 were discussed with the patient and how to seek care for testing (follow up with PCP or arrange E-visit).   The importance of social distancing was discussed today.  I spent a total of 55minutes with the patient and chart review. >  50% of the time was spent in direct patient consultation.  Additional time spent with chart  review (studies, outside notes, etc): 10 Total Time: 35 min   Current medicines are reviewed at length with the patient today.  (+/- concerns) n/a   Patient Instructions / Medication Changes & Studies & Tests Ordered   Patient Instructions  Medication Instructions:   try an take Lasix at leat 3 times a week , if not everyday.   instruction for [employer]  With 1/2 tablet  Twice a day for 2 weeks then increase to 1/2 tablet in the morning and whole tablet in the evening  For 2 weeks then increase to  Whole tablet twice a day   *If you need a refill on your cardiac medications before your next appointment, please call your pharmacy*  Lab Work: BMP  - 2 weeks  If you have labs (blood work) drawn today and your tests are completely normal, you will receive your results only by: Marland Kitchen MyChart Message (if you have MyChart) OR . A paper copy in the mail If you have any lab test that is abnormal or we need to change your treatment, we will call you to review the results.  Testing/Procedures: Will be schedule at [address]- end of March 2021 Your physician has requested that you have an echocardiogram. Echocardiography is a painless test that uses sound waves to create images of your heart. It provides your doctor with information about the size and shape of your heart and how well your heart's chambers and valves are working. This procedure takes approximately one hour. There are no restrictions for this procedure.    Follow-Up: At [facility], you and your health needs are our priority.  As part of our continuing mission to provide you with exceptional  heart care, we have created designated Provider Care Teams.  These Care Teams include your primary Cardiologist (physician) and Advanced Practice Providers (APPs -  Physician Assistants and Nurse Practitioners) who all work together to provide you with the care you need, when you need it.  Your next appointment:    3 month(s)  The format for your next appointment:   In Person  Provider:   [name], MD  Other Instructions    Studies Ordered:   Orders Placed This Encounter  Procedures  . Basic metabolic panel  . EKG 12-Lead  . ECHOCARDIOGRAM COMPLETE     [name], M.D., M.S. Interventional Cardiologist   Pager # [phone] Phone # [phone] [address] [place], [place] [address]   Thank you for choosing Heartcare at [facility]!!

## 2019-09-22 ENCOUNTER — Encounter: Payer: Self-pay | Admitting: Cardiology

## 2019-09-22 NOTE — Assessment & Plan Note (Signed)
It really seems that his drop in EF has been related to his left bundle branch block.  Clearly his proximal LAD ischemia has something to do that but is not really a great PCI option.  He has not had any anginal symptoms either.  I hope that he is able to tolerate carvedilol and Entresto for the remainder this next 2 months.  To see if he still would potentially meet criteria for CRT-D versus CRT-P.

## 2019-09-22 NOTE — Assessment & Plan Note (Signed)
Doing well.  Recommend taking Lasix at least 3 days a week.

## 2019-09-22 NOTE — Assessment & Plan Note (Signed)
Surely there is an ischemic component with microvascular ischemia, but I think left bundle branch block is the most responsible for the drop in EF.  He has had a hard time tolerating the medication that would try to have him on for his cardiomyopathy, but now that we have discussed the possibility of CRT-D, he understands the importance of making the efforts and understands the benefits of these medications.  He has done a great job over the last month and a half, only stopping the [facility] for the last week or so because of justified concern with hypotension.  He is willing to restart now, because in fact his blood pressures are going up.[name]

## 2019-09-22 NOTE — Telephone Encounter (Signed)
Will -  I sent you my note from this week.  He seems to be doing pretty well taking Entresto & Carvedilol -- had some pretty low BPs a week or so ago & held [place] (based on Triage RN recommendation) -- is now restarting @ 1/2 tab BID with plans to titrate up to full tab BID.  He knows that he really needs to try.  [name], MD

## 2019-09-22 NOTE — Assessment & Plan Note (Signed)
All but is RIMA-LAD or occluded as far grafts ago.  Now has stent revascularization of the RCA and circumflex.  No active anginal symptoms which for him was frequent PVCs and fatigue.

## 2019-09-22 NOTE — Assessment & Plan Note (Signed)
Basically he has RIMA to LAD and then completely revascularized native RCA and circumflex territory.  Remains on aspirin alone because of concern for bleeding issues on Plavix.  Is tolerating 5 mg of rosuvastatin which is about the most has been able tolerate.  Hopefully as inclisiran option becomes available he may be willing to try that.  For now he has restarted carvedilol and is going to titrate up his Entresto dose.

## 2019-09-22 NOTE — Assessment & Plan Note (Signed)
Thankfully, he really is having may be class II with no symptoms.  He is doing okay with intermittent dosing of Lasix, but I think he may benefit from being on a standing dose at least 3 days a week.  Plan for now is to try to titrate back to a full tablet of carvedilol 3.125 mg twice daily and then we will start him back on the Entresto but started 1/2 tablet twice daily and gradually tied titrated up to full tablet each week or so.  He is determined to make this adjustment, and indeed has been making the effort on the beta-blocker and Entresto since late November.  We will recheck an echocardiogram in the end of March to see if there is any improvement.  Depending what that shows, would probably consider discussed with Dr. [name] potential timing for either CRT-D or to simple CRT-P

## 2019-09-22 NOTE — Assessment & Plan Note (Signed)
This is been the most concerning issue in the past is that he has had hypotension.  Thankfully though it appears that his pressures are stabilized out a little bit and he may very well be uptitrated up his carvedilol and Entresto doses.  I do not see him yet to get beyond 3.125 mg twice daily of carvedilol along with starting dose Entresto twice daily.  That would be the most of any blood pressure medicines have had on him for at least 10 years.

## 2019-09-24 NOTE — Progress Notes (Signed)
Gr8 --   One of the main reasons why I sent him to you initially was to have another doctor tell him he needs to take his medicines  [name], MD

## 2019-10-02 DIAGNOSIS — Z23 Encounter for immunization: Secondary | ICD-10-CM | POA: Diagnosis not present

## 2019-10-07 DIAGNOSIS — I420 Dilated cardiomyopathy: Secondary | ICD-10-CM | POA: Diagnosis not present

## 2019-10-07 DIAGNOSIS — Z9861 Coronary angioplasty status: Secondary | ICD-10-CM | POA: Diagnosis not present

## 2019-10-07 DIAGNOSIS — I5042 Chronic combined systolic (congestive) and diastolic (congestive) heart failure: Secondary | ICD-10-CM | POA: Diagnosis not present

## 2019-10-07 DIAGNOSIS — I2510 Atherosclerotic heart disease of native coronary artery without angina pectoris: Secondary | ICD-10-CM | POA: Diagnosis not present

## 2019-10-07 DIAGNOSIS — I447 Left bundle-branch block, unspecified: Secondary | ICD-10-CM | POA: Diagnosis not present

## 2019-10-08 LAB — BASIC METABOLIC PANEL
BUN/Creatinine Ratio: 12 (ref 10–24)
BUN: 11 mg/dL (ref 8–27)
CO2: 23 mmol/L (ref 20–29)
Calcium: 9.5 mg/dL (ref 8.6–10.2)
Chloride: 99 mmol/L (ref 96–106)
Creatinine, Ser: 0.94 mg/dL (ref 0.76–1.27)
GFR calc Af Amer: 89 mL/min/{1.73_m2} (ref >59)
GFR calc non Af Amer: 77 mL/min/{1.73_m2} (ref >59)
Glucose: 122 mg/dL — ABNORMAL HIGH (ref 65–99)
Potassium: 4.8 mmol/L (ref 3.5–5.2)
Sodium: 140 mmol/L (ref 134–144)

## 2019-11-01 DIAGNOSIS — Z23 Encounter for immunization: Secondary | ICD-10-CM | POA: Diagnosis not present

## 2019-11-27 ENCOUNTER — Encounter (INDEPENDENT_AMBULATORY_CARE_PROVIDER_SITE_OTHER): Payer: Self-pay

## 2019-11-27 ENCOUNTER — Other Ambulatory Visit: Payer: Self-pay

## 2019-11-27 ENCOUNTER — Ambulatory Visit (HOSPITAL_COMMUNITY): Payer: Medicare Other | Attending: Cardiology

## 2019-11-27 DIAGNOSIS — I447 Left bundle-branch block, unspecified: Secondary | ICD-10-CM | POA: Diagnosis not present

## 2019-11-27 DIAGNOSIS — I2510 Atherosclerotic heart disease of native coronary artery without angina pectoris: Secondary | ICD-10-CM | POA: Diagnosis not present

## 2019-11-27 DIAGNOSIS — I420 Dilated cardiomyopathy: Secondary | ICD-10-CM | POA: Diagnosis not present

## 2019-11-27 DIAGNOSIS — Z9861 Coronary angioplasty status: Secondary | ICD-10-CM | POA: Insufficient documentation

## 2019-11-27 DIAGNOSIS — I5042 Chronic combined systolic (congestive) and diastolic (congestive) heart failure: Secondary | ICD-10-CM | POA: Insufficient documentation

## 2019-11-27 HISTORY — PX: TRANSTHORACIC ECHOCARDIOGRAM: SHX275

## 2019-11-27 MED ORDER — PERFLUTREN LIPID MICROSPHERE
1.0000 mL | INTRAVENOUS | Status: AC | PRN
  Administered 2019-11-27: 2 mL via INTRAVENOUS

## 2019-12-09 ENCOUNTER — Ambulatory Visit (INDEPENDENT_AMBULATORY_CARE_PROVIDER_SITE_OTHER): Payer: Medicare Other | Admitting: Cardiology

## 2019-12-09 ENCOUNTER — Encounter: Payer: Self-pay | Admitting: Cardiology

## 2019-12-09 ENCOUNTER — Other Ambulatory Visit: Payer: Self-pay

## 2019-12-09 DIAGNOSIS — Z9861 Coronary angioplasty status: Secondary | ICD-10-CM

## 2019-12-09 DIAGNOSIS — I5042 Chronic combined systolic (congestive) and diastolic (congestive) heart failure: Secondary | ICD-10-CM

## 2019-12-09 DIAGNOSIS — I420 Dilated cardiomyopathy: Secondary | ICD-10-CM | POA: Diagnosis not present

## 2019-12-09 DIAGNOSIS — I447 Left bundle-branch block, unspecified: Secondary | ICD-10-CM | POA: Diagnosis not present

## 2019-12-09 DIAGNOSIS — I951 Orthostatic hypotension: Secondary | ICD-10-CM | POA: Diagnosis not present

## 2019-12-09 DIAGNOSIS — I2510 Atherosclerotic heart disease of native coronary artery without angina pectoris: Secondary | ICD-10-CM | POA: Diagnosis not present

## 2019-12-09 DIAGNOSIS — I493 Ventricular premature depolarization: Secondary | ICD-10-CM | POA: Diagnosis not present

## 2019-12-09 NOTE — Progress Notes (Signed)
Primary Care Provider: [name], MD Cardiologist: [name], MD Electrophysiologist: None  Clinic Note: Chief Complaint  Patient presents with  . Follow-up    Echo results; on "optimal medical management " --> hope for CRT-P  . Coronary Artery Disease    No angina.  1 day of short spell PVCs otherwise stable  . Cardiomyopathy    Mostly notes fatigue.  Swelling stable but a little bit worse over the last couple days  . Congestive Heart Failure    A little more short of breath and fatigued with mildly worsening swelling.  Has been taking less than daily Lasix.    HPI:    [name] is a 79 y.o. male with a complicated PMH of CAD-CABG-PCI, now with COMBINED SYSTOLIC AND [place] (related to combination of LBBB and likely microvascular ischemia) below who presents today for 48-month follow-up..  CAD->CABG 07/2004 (Sx = fatigue & PVCs) --> CATH -> MV CAD --> CABG X 4 (RIMA-LAD, LIMA-Diag, SVG-OM, SVG-RCA) ? 04/2011 - recurrent Sx -> Myoview + for Inf Ischemia --> Cath => Only RIMA-LAD patent --> DES PCI of Native Cx-OM (2 overlapping Promus DES) & Native RCA ? June 2016 - DES PCI of the dRCA-RPAV. Since then he has done very well with no active anginal symptoms. He has developed complete LEFT BUNDLE BRANCH BLOCK and subsequently COMBINED ISCHEMIC/NONISCHEMIC CARDIOMYOPATHY  Cardiomyopathy: April 2019 ECHO with EF of roughly 35%-noted new LBBB diagnosis. ? Relook Cath MAY 2019 - patent stents &RIMA-LAD.EF estimated 35 to 45%. Normal right heart cath numbers. PCWP 12 mmHg. LV EDP 14 mmHg ? Most recent Echo October 2020: EF 30-35%. ? Initial referral to electrophysiology for consideration of BiV ICD, turned down because he was not on "optimal medical management "-has not been tolerant of just on any medication.  ? Plan was to try to get him on optimal dosing of carvedilol and Entresto then reevaluate EF prior to rereferral for CRT-P (not  likely CRT-D given age)  [name] was last seen on September 19, 2019 to discuss results of EP consult.  He is very fatigued, noticing having to rest more than do.  Relatively euvolemic with no significant edema or PND/orthopnea, just fatigue and exertional dyspnea.  Very weak and barely able to tolerate carvedilol alone Entresto.  However, he had committed to attempting to get back on these medications with the understanding that this was necessary in order to qualify for CRT P  He was able to titrate carvedilol 3.125 mg twice daily.  Restarted Entresto 24-26mg  initiating at 1/2 tablet twice daily.  He has titrated up to 1 full tablet twice daily, but has been extremely fatigued.  There have been days where he has had to only take one half dose.Marland Kitchen  Recent Hospitalizations: None  Reviewed  CV studies:    The following studies were reviewed today: (if available, images/films reviewed: From Epic Chart or Care Everywhere)  Jan 27, 2020 Echo: EF estimated 30% with moderate to severe decreased function and global hypokinesis.  Significant septal-lateral dyssynchrony from LBBB.  Mild LV dilation.  At least GR 1 DD.  Mildly reduced RV function but normal pressures.  Mild aortic sclerosis but no significant regurgitation.   Interval History:   [name] returns here today indicating that he is really having a hard time taking the carvedilol and Entresto.  He has days where he is extremely fatigued requiring help even getting himself in and out of the  chair.  He says at this point he does something for 5 minutes of rest for 30 minutes as opposed to the other way around.  He actually had a spell last week where he had an increase of PVCs for the first time in a long time following his last PCI.  But that went away and he has not had any further. Interestingly, he really does not have any PND but does have mild baseline orthopnea.  Edema seems to be pretty well controlled and he does not always take the  furosemide every day.  He has been taking it maybe 4-5 times a week but thinks that he probably needs to take some either today or tomorrow because of being a little more short of breath.  He indicates that one major side effect as noted in from the [place] is that he is head and scalp itch incessantly.  We did communicate in the interim since I last saw him and I did tell him on occasion to reduce his morning dose of Entresto to 1/2 tablet.  He says that when he does the half tablet he definitely feels better than when he takes a full tablet.  He is able to tolerate full dose in the evening. He is actually had some decent blood pressures with these current medications after initially having low readings.  As usual, he does not really have any chest tightness or pressure and that was never his symptom of angina.  His main symptom was PVCs which have been relatively minimal.  What he does note is that he is having more occasional coughing and almost some wheezing.  There is question of could this could be a "asthma component ".  (I explained that it probably is more related to "cardiac asthma ")  CV Review of Symptoms (Summary) Cardiovascular ROS: positive for - dyspnea on exertion, irregular heartbeat, orthopnea, shortness of breath and Profound fatigue and weakness.  Exercise intolerance. negative for - edema, palpitations, paroxysmal nocturnal dyspnea, rapid heart rate or No true near syncope or syncope.  Only profound weakness.  No TIA or amaurosis fugax.  No claudication.  The patient does not have symptoms concerning for COVID-19 infection (fever, chills, cough, or new shortness of breath).  The patient is practicing social distancing & Masking.   He has completed both COVID-19 vaccine injections.   REVIEWED OF SYSTEMS   Review of Systems  Constitutional: Positive for malaise/fatigue. Negative for weight loss (His weight is actually gone up over the last couple days indicating that he  should take more Lasix.).  HENT: Negative for congestion and nosebleeds.   Respiratory: Positive for cough (Per HPI), shortness of breath (Per HPI) and wheezing (Per HPI). Negative for sputum production.   Cardiovascular: Positive for leg swelling (Off and on, but currently stable).  Gastrointestinal: Negative for abdominal pain, blood in stool, melena, nausea and vomiting.  Genitourinary: Negative for hematuria.  Musculoskeletal: Negative for joint pain.  Skin: Positive for itching (Head and scalp).  Neurological: Positive for dizziness and weakness (Global). Negative for tingling.  Endo/Heme/Allergies: Negative for environmental allergies.  Psychiatric/Behavioral: Positive for depression (He is quite down not just about his own health, but also his wife has been sick, and both of his sons have valvular cardiomyopathy.  1 of whom has become steadily worse.) and memory loss (Is starting to get a little foggy). The patient is not nervous/anxious and does not have insomnia.        Sleeping more than usual.  I have reviewed and (if needed) personally updated the patient's problem list, medications, allergies, past medical and surgical history, social and family history.   PAST MEDICAL HISTORY   Past Medical History:  Diagnosis Date  . Allergic rhinitis   . Arthritis    "entire body" (02/19/2015)  . Asthma   . Asthmatic bronchitis   . CAD of autologous vein bypass graft without angina 04/2011   Frequent PVCs & fatigue -> MYOVIEW w/ inferior /r MV distribution --> CATH: only RIMA-LAD patent (LIMA atretic & SVGs occluded) --> Staged PCI of Native RCA & Cx; followed by PCI-distal RCA  . CAD S/P percutaneous coronary angioplasty 8/'12; 06/'16   a) PCI- Cx-OM W/ 2 overlapping Resolute DES 3x15 & 3x18 stents (3.65mm),  PCI-RCA -  Promus DES 3x38 (3.43mm);; b) 6/'16: PCI-dRCA-rPAV Promus DES 2.75X28 (3.0 mm) -- stents patent in 01/2018;  01/2018 -stents remain patent  . CAS (cerebral atherosclerosis)      CAROTID DOPPLER, 08/25/2011 - mildly abnormal  . Chronic back pain   . Dilated cardiomyopathy ([place]): EF ~35%, 01/05/2018   TTE April 2019: EF~35% with diffuse HK - septal and lateral walls.  Mildly dilated LV.  "GR 1 "DD with high filling pressures. ?  Left atrium normal size --> new LBBB - no change to Coronary Anatony on Cath; -> EF now 30 to 35% as of October 2020  . Diverticulosis   . Erectile dysfunction   . Essential hypertension    Medication intolerant -- only able to take minimal doses of medications  . Family hx of colon cancer   . GERD (gastroesophageal reflux disease)   . Gout   . Hemorrhoids   . Hyperlipidemia with target LDL less than 70    Only able to tolerate very low dose statin - no interest in other options  . Kidney stones   . Myocardial infarction ([place]) 2005  . Polycythemia   . 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 Symptomatic PVCs   . Syncope 2010   ? unclear of etiology    PAST SURGICAL HISTORY   Past Surgical History:  Procedure Laterality Date  . CARDIAC CATHETERIZATION  07/15/2004   CABG recommended; continue medical therapy  . CARDIAC CATHETERIZATION  04/26/2011   Patent RIMA-LAD, atretic LIMA-Dx (previous) 100 and CTO SVG to Cx and SVG to PDA. LAD 80-90% eccentric stenosis at SP1 that has 70% stenosis. Cx: Large OM and another distal branch with 90-95% stenosis followed by lesion in the OM branch. RCA dominant 60-80% lesion proximally w/ several old lesions. PDA occluded 70-80% annular lesion in the proximal RCA. 3 of 4 grafts CTO RIMA-LAD patent  . CARDIAC CATHETERIZATION N/A 02/20/2015   Procedure: Left Heart Cath and Coronary Angiography;  Surgeon: Leonie Man, MD;  Location: [facility]: Widely patent p-mRCA DES with dRCA 50%-PAVG 80% --> PCI. Patent overlapping stents --mCx-OM. 99% pLAD - patent RIMA-dLAD.  Known CTO SVG-rPL, SVG-OM3,  :LIMA-D1.  Marland Kitchen CATARACT EXTRACTION W/ INTRAOCULAR LENS  IMPLANT, BILATERAL Bilateral ~ 2006/2007  . CORONARY ANGIOPLASTY WITH STENT PLACEMENT  04/28/2011; 02/20/2015   PCI-Cx-OM 3 overlapping Resolute DES 3 mm x35mm and 3x65mm --> 3.17mm; PCI to RCA - Promus Element DES 3x11mm --> 3.41mm;; b) dRCA-rPAV: Promus Premier DES 2.59mm X 28 mm (3.0 mm)   . CORONARY ARTERY BYPASS GRAFT  07/23/2004   LIMA-Cx RIMA-LAD, SVG-diagonal, SVG-PDA  .  RIGHT/LEFT HEART CATH AND CORONARY/GRAFT ANGIOGRAPHY N/A 01/09/2018   Procedure: RIGHT/LEFT HEART CATH AND CORONARY/GRAFT ANGIOGRAPHY;  Surgeon: Leonie Man, MD;; Known occluded LIMA-Diag & SVG-RCA, SVG-OM.  Mod-severe pLAD that is grafted after D1 - patent RIMA- wraparound LAD, distal 1/2 of PDA territory. Patent Cx-OM DES (ost Cx mild) & p-mRCA overlapping DES & dRCA-RPAV DES.  PCWP 12 mmHg, LVP-EDP: 140/6 - 14 mmH. RAP 2 mmHg. RVP-EDP 33/0 - 5  mmHg  . TRANSTHORACIC ECHOCARDIOGRAM  02/18/2015    Mild concentric LVH, EF 55-60%, no RWMA, Gr 1 DD, aortic sclerosis without stenosis  . TRANSTHORACIC ECHOCARDIOGRAM  12/2017; 06/2019   (New LBBB)  EF~35% with diffuse HK of septal & lateral walls.  Mildly dilated LV.  "GR 1 "DD with high filling pressures. ? w/ normal LA size; 10/20: EF 30-35%, LBBB related Septal dyskinesis, GR2 DD (but normal LA size).   . TRANSTHORACIC ECHOCARDIOGRAM  11/27/2019   (On optimal/max tolerated dose of carvedilol and Entresto): EF estimated 30% with moderate to severe decreased function and global hypokinesis.  Significant septal-lateral dyssynchrony from LBBB.  Mild LV dilation.  At least GR 1 DD.  Mildly reduced RV function but normal pressures.  Mild aortic sclerosis but no significant regurgitation.  . TRANSURETHRAL RESECTION OF PROSTATE  ~ 2002/2003    RLCP 01/2018: LIMA-Diag, SVG-RCA, SVG-OM known CTO.  ? Patent RIMA-LAD (severe native LAD disease prior to D1 &D2 both with extensive disease) - retrograde fills to CTO &antegrade  wrap-around LAD (distal 1/2 of PDA).  ? DES in LCx-OM &p-m RCA &dRCA-RPAV widely patent with patent RPDA.  ? Normal RHC #s: PCWP / LVEDP ~12-14 mmHg. RAP &RVEDP ~0-2 mmHg.   MEDICATIONS/ALLERGIES   No outpatient medications have been marked as taking for the 12/09/19 encounter (Office Visit) with Leonie Man, MD.    Allergies  Allergen Reactions  . Meperidine Hcl     Cold sweats, dizziness   . Penicillins     Severe headaches Has patient had a PCN reaction causing immediate rash, facial/tongue/throat swelling, SOB or lightheadedness with hypotension: No Has patient had a PCN reaction causing severe rash involving mucus membranes or skin necrosis: No Has patient had a PCN reaction that required hospitalization: No Has patient had a PCN reaction occurring within the last 10 years: No If all of the above answers are "NO", then may proceed with Cephalosporin use.     SOCIAL HISTORY/FAMILY HISTORY   Reviewed in Epic:  Pertinent findings: Both sons now have valvular cardiomyopathy of the mitral valve disease.  The one of who lives in [place] is very sick.  He is also stressed because his wife's health is not been great  OBJCTIVE -PE, EKG, labs   Wt Readings from Last 3 Encounters:  12/09/19 196 lb (88.9 kg)  09/19/19 191 lb 9.6 oz (86.9 kg)  07/08/19 193 lb 9.6 oz (87.8 kg)    Physical Exam: BP 132/84   Pulse 65   Ht 5\' 7"  (1.702 m)   Wt 196 lb (88.9 kg)   SpO2 97%   BMI 30.70 kg/m  Physical Exam  Constitutional: He is oriented to person, place, and time. He appears well-developed and well-nourished. No distress.  HENT:  Head: Normocephalic.  Neck: JVD (7 cm or) present. No hepatojugular reflux present. Carotid bruit is not present.  Cardiovascular: Normal rate, regular rhythm, S1 normal and intact distal pulses.  No extrasystoles are present. PMI is displaced (Slight lateral displacement, nonsustained). Exam reveals gallop, S4 and distant  heart sounds. Exam reveals  no friction rub.  Murmur (1/6 SEM at RUSB) heard. Split S2  Pulmonary/Chest: Effort normal and breath sounds normal. No respiratory distress. He has no wheezes. He has no rales.  Abdominal: Soft. Bowel sounds are normal. He exhibits no distension. There is no abdominal tenderness.  No HSM.  Mild truncal obesity.  Musculoskeletal:        General: No edema (Trivial). Normal range of motion.     Cervical back: Normal range of motion and neck supple.  Neurological: He is oriented to person, place, and time.  Psychiatric: His behavior is normal. Judgment and thought content normal.  Somewhat depressed mood and flat affect.  Not as intense as usual.  Vitals reviewed.    Adult ECG Report Not checked Recent Labs: Last checked in July 2020: TC 218, TG 144, HDL 47, LDL 94.  ALT A1c 6.2.  Hgb 15.6.  CR 0.95.  No results found for: TSH  ASSESSMENT/PLAN    Problem List Items Addressed This Visit    CAD S/P  PCI of Cx-OM w/ 2 Resolute DES 3.0 x 15 & 3.0 x 18 (3.49mm); PCI-pRCA: Promus Element DES 3.0 x 38 (3.40mm); dRCA-rPAV: Promus Premier DES 2.75x28 (3 mm) (Chronic)    Now he has RIMA to LAD and otherwise revascularized via stents in the native RCA circumflex territory.  Has done quite well from a symptom standpoint after the most recent PCI of the distal RCA-PAV.   Only that one spell of more than usual PVCs.  But otherwise has been doing quite well. Major issue is fatigue.  Plan:   Continue low-dose carvedilol as tolerated along with aspirin.  No longer on Plavix because of bleeding issues.  On very low-dose rosuvastatin (at present, although he would be a good candidate for the [place] trial, we are going to try to get him through the concept of CRT-D first).  He is not interested in talk about new medicines at this time.      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  Relook ischemic  evaluation did not show any new evidence of ischemic CAD territory besides microvascular disease.  Not having any true anginal symptoms.  Plan for now is follow-up with Dr. [name] to discuss CRT-P placement.  Hopefully with cardiac resynchronization restoring septal contraction will improve his EF.      Dilated cardiomyopathy ([place]): EF ~35%, new LBBB - no change to Coronary Anatony on Cath (Chronic)    EF now 30% by echo with significant septal dyssynchrony from LBBB. Clearly there is an ischemic component related to his bundle branch block due to septal ischemia either from nonrevascularized septal branches or microvascular disease. Never really had angina. On max tolerated doses of carvedilol and Entresto.  He has successfully been on this dosing interval for over 3 months now starting back in December.  He did hold the [facility] for about 2 weeks late December into January until we saw him in follow-up because of his extreme fatigue and dizziness.  Once that stabilized he was able to restart.  He has never been able to tolerate normal/standard doses of medications.  And the fact that he is able to tolerate this current regimen to the point that he is is abnormal and and attribute based on the fact that he truly wants to have the CRT-P placement.      Chronic combined  systolic and diastolic heart failure (HCC) (Chronic)    I think he is really having class II symptoms mostly because of his generalized fatigue.  Relatively euvolemic although his weight is up a little bit.  He has not been taking Lasix every day but will probably pick it up to may be 5 to 6 days a week is positive for 5 days a week based on the fact his weight is gone up and he is little more short of breath.  This also could be partly noted with symptoms of cough and wheeze which could be related to increased pulmonary venous pressures.  I recommend that he does increase his Lasix dose. Otherwise he is on max tolerated dose of  carvedilol and Entresto.  For the most part he is taking either one half or 1 full tab of Entresto in the morning depending on how he feels and how low his blood pressure is.  He takes a full dose in the evening.  He is able to tolerate twice daily carvedilol.  Would not try to start spironolactone at this point because of his borderline blood pressure issues and fatigue      PVC's (premature ventricular contractions) (Chronic)    Seems to be better.  He is doing okay on the carvedilol at this point and being on standard dosing seems to have the PVCs controlled.  He had that 1 day where there was about a 5-minute period of having more frequent PVCs, but none other than that.  Otherwise they are back to his baseline.      Orthostatic hypotension - with tremor    Not able to titrate his carvedilol or Entresto any further than we have done because of profound orthostatic hypotension and dizziness with even these doses.  He is been doing better from that standpoint having dropped the morning dose on occasion to 1/2 tablet Entresto.          COVID-19 Education: The signs and symptoms of COVID-19 were discussed with the patient and how to seek care for testing (follow up with PCP or arrange E-visit).   The importance of social distancing was discussed today.  I spent a total of 66minutes with the patient. >  50% of the time was spent in direct patient consultation.  Additional time spent with chart review  / charting (studies, outside notes, etc): 16 Total Time: 42 min  Time spent in conferring with Dr. [name] from electrophysiology scheduling follow-up visit.  Part of the reason why he is upset about how poorly he feels now is because of his sons both being sick as well and him not being able to travel to see them.  He has been placing a lot of hope and desire on potential benefit from CRT-P.  We spent about 10 minutes talking about the mechanism of how this should help, being cautiously  optimistic.  Current medicines are reviewed at length with the patient today.  (+/- concerns) none  Notice: This dictation was prepared with Dragon dictation along with smaller phrase technology. Any transcriptional errors that result from this process are unintentional and may not be corrected upon review.  Patient Instructions / Medication Changes & Studies & Tests Ordered   Patient Instructions  Medication Instructions:  No changes *If you need a refill on your cardiac medications before your next appointment, please call your pharmacy*   Lab Work: not needed   Testing/Procedures: Not needed   Follow-Up: At [facility], you and your health  needs are our priority.  As part of our continuing mission to provide you with exceptional heart care, we have created designated Provider Care Teams.  These Care Teams include your primary Cardiologist (physician) and Advanced Practice Providers (APPs -  Physician Assistants and Nurse Practitioners) who all work together to provide you with the care you need, when you need it.   Your next appointment:   3 to 4 month(s)  July or Aug. 2021  The format for your next appointment:   In Person  Provider:   [name], MD   Other Instructions n/a    Studies Ordered:   No orders of the defined types were placed in this encounter.    [name], M.D., M.S. Interventional Cardiologist   Pager # [phone] Phone # [phone] [address] [place],  [address]   Thank you for choosing Heartcare at [facility]!!

## 2019-12-09 NOTE — Patient Instructions (Addendum)
Medication Instructions:  No changes *If you need a refill on your cardiac medications before your next appointment, please call your pharmacy*   Lab Work: not needed   Testing/Procedures: Not needed   Follow-Up: At [facility], you and your health needs are our priority.  As part of our continuing mission to provide you with exceptional heart care, we have created designated Provider Care Teams.  These Care Teams include your primary Cardiologist (physician) and Advanced Practice Providers (APPs -  Physician Assistants and Nurse Practitioners) who all work together to provide you with the care you need, when you need it.   Your next appointment:   3 to 4 month(s)  July or Aug. 2021  The format for your next appointment:   In Person  Provider:   [name], MD   Other Instructions n/a

## 2019-12-11 ENCOUNTER — Encounter: Payer: Self-pay | Admitting: Cardiology

## 2019-12-11 NOTE — Assessment & Plan Note (Signed)
I think he is really having class II symptoms mostly because of his generalized fatigue.  Relatively euvolemic although his weight is up a little bit.  He has not been taking Lasix every day but will probably pick it up to may be 5 to 6 days a week is positive for 5 days a week based on the fact his weight is gone up and he is little more short of breath.  This also could be partly noted with symptoms of cough and wheeze which could be related to increased pulmonary venous pressures.  I recommend that he does increase his Lasix dose. Otherwise he is on max tolerated dose of carvedilol and Entresto.  For the most part he is taking either one half or 1 full tab of Entresto in the morning depending on how he feels and how low his blood pressure is.  He takes a full dose in the evening.  He is able to tolerate twice daily carvedilol.  Would not try to start spironolactone at this point because of his borderline blood pressure issues and fatigue

## 2019-12-11 NOTE — Assessment & Plan Note (Signed)
Now he has RIMA to LAD and otherwise revascularized via stents in the native RCA circumflex territory.  Has done quite well from a symptom standpoint after the most recent PCI of the distal RCA-PAV.   Only that one spell of more than usual PVCs.  But otherwise has been doing quite well. Major issue is fatigue.  Plan:   Continue low-dose carvedilol as tolerated along with aspirin.  No longer on Plavix because of bleeding issues.  On very low-dose rosuvastatin (at present, although he would be a good candidate for the [place] trial, we are going to try to get him through the concept of CRT-D first).  He is not interested in talk about new medicines at this time.

## 2019-12-11 NOTE — Assessment & Plan Note (Signed)
Seems to be better.  He is doing okay on the carvedilol at this point and being on standard dosing seems to have the PVCs controlled.  He had that 1 day where there was about a 5-minute period of having more frequent PVCs, but none other than that.  Otherwise they are back to his baseline.

## 2019-12-11 NOTE — Assessment & Plan Note (Signed)
His drop in EF has been precipitous since developing complete left bundle branch block.  Relook ischemic evaluation did not show any new evidence of ischemic CAD territory besides microvascular disease.  Not having any true anginal symptoms.  Plan for now is follow-up with Dr. [name] to discuss CRT-P placement.  Hopefully with cardiac resynchronization restoring septal contraction will improve his EF.

## 2019-12-11 NOTE — Assessment & Plan Note (Signed)
Not able to titrate his carvedilol or Entresto any further than we have done because of profound orthostatic hypotension and dizziness with even these doses.  He is been doing better from that standpoint having dropped the morning dose on occasion to 1/2 tablet Entresto.

## 2019-12-11 NOTE — Assessment & Plan Note (Signed)
EF now 30% by echo with significant septal dyssynchrony from LBBB. Clearly there is an ischemic component related to his bundle branch block due to septal ischemia either from nonrevascularized septal branches or microvascular disease. Never really had angina. On max tolerated doses of carvedilol and Entresto.  He has successfully been on this dosing interval for over 3 months now starting back in December.  He did hold the [facility] for about 2 weeks late December into January until we saw him in follow-up because of his extreme fatigue and dizziness.  Once that stabilized he was able to restart.  He has never been able to tolerate normal/standard doses of medications.  And the fact that he is able to tolerate this current regimen to the point that he is is abnormal and and attribute based on the fact that he truly wants to have the CRT-P placement.

## 2019-12-30 ENCOUNTER — Ambulatory Visit (INDEPENDENT_AMBULATORY_CARE_PROVIDER_SITE_OTHER): Payer: Medicare Other | Admitting: Cardiology

## 2019-12-30 ENCOUNTER — Other Ambulatory Visit: Payer: Self-pay

## 2019-12-30 ENCOUNTER — Encounter: Payer: Self-pay | Admitting: Cardiology

## 2019-12-30 VITALS — BP 136/84 | HR 77 | Ht 67.0 in | Wt 192.0 lb

## 2019-12-30 DIAGNOSIS — I2510 Atherosclerotic heart disease of native coronary artery without angina pectoris: Secondary | ICD-10-CM | POA: Diagnosis not present

## 2019-12-30 DIAGNOSIS — Z9861 Coronary angioplasty status: Secondary | ICD-10-CM | POA: Diagnosis not present

## 2019-12-30 DIAGNOSIS — Z01812 Encounter for preprocedural laboratory examination: Secondary | ICD-10-CM | POA: Diagnosis not present

## 2019-12-30 DIAGNOSIS — I5022 Chronic systolic (congestive) heart failure: Secondary | ICD-10-CM | POA: Diagnosis not present

## 2019-12-30 NOTE — Progress Notes (Signed)
Electrophysiology Office Note   Date:  12/30/2019   ID:  Akhari, Washko 23-Mar-1941, MRN [id number]  PCP:  [name], MD  Cardiologist:  Ellyn Hack Primary Electrophysiologist:  Talulah Schirmer Meredith Leeds, MD    Chief Complaint: CHF   History of Present Illness: [name] is a 79 y.o. male who is being seen today for the evaluation of CHF at the request of [name], MD. Presenting today for electrophysiology evaluation.  He has a history of coronary artery disease status post CABG, PVCs, CHF, hypertension, hyperlipidemia.  Today is a weakness and fatigue.  He feels like he does not have the energy to do exactly what he wants to do.  Today, denies symptoms of palpitations, chest pain, shortness of breath, orthopnea, PND, lower extremity edema, claudication, dizziness, presyncope, syncope, bleeding, or neurologic sequela. The patient is tolerating medications without difficulties.  His main complaints are weakness and fatigue.  He says that in the mornings he feels well, but as the day goes on, he gets weaker and weaker.  He does not have any chest pain or shortness of breath.   Past Medical History:  Diagnosis Date  . Allergic rhinitis   . Arthritis    "entire body" (02/19/2015)  . Asthma   . Asthmatic bronchitis   . CAD of autologous vein bypass graft without angina 04/2011   Frequent PVCs & fatigue -> MYOVIEW w/ inferior /r MV distribution --> CATH: only RIMA-LAD patent (LIMA atretic & SVGs occluded) --> Staged PCI of Native RCA & Cx; followed by PCI-distal RCA  . CAD S/P percutaneous coronary angioplasty 8/'12; 06/'16   a) PCI- Cx-OM W/ 2 overlapping Resolute DES 3x15 & 3x18 stents (3.32mm),  PCI-RCA -  Promus DES 3x38 (3.66mm);; b) 6/'16: PCI-dRCA-rPAV Promus DES 2.75X28 (3.0 mm) -- stents patent in 01/2018;  01/2018 -stents remain patent  . CAS (cerebral atherosclerosis)    CAROTID DOPPLER, 08/25/2011 - mildly abnormal  . Chronic back pain   . Dilated cardiomyopathy ([place]): EF  ~35%, 01/05/2018   TTE April 2019: EF~35% with diffuse HK - septal and lateral walls.  Mildly dilated LV.  "GR 1 "DD with high filling pressures. ?  Left atrium normal size --> new LBBB - no change to Coronary Anatony on Cath; -> EF now 30 to 35% as of October 2020  . Diverticulosis   . Erectile dysfunction   . Essential hypertension    Medication intolerant -- only able to take minimal doses of medications  . Family hx of colon cancer   . GERD (gastroesophageal reflux disease)   . Gout   . Hemorrhoids   . Hyperlipidemia with target LDL less than 70    Only able to tolerate very low dose statin - no interest in other options  . Kidney stones   . Myocardial infarction ([place]) 2005  . Polycythemia   . 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 Symptomatic PVCs   . Syncope 2010   ? unclear of etiology   Past Surgical History:  Procedure Laterality Date  . CARDIAC CATHETERIZATION  07/15/2004   CABG recommended; continue medical therapy  . CARDIAC CATHETERIZATION  04/26/2011   Patent RIMA-LAD, atretic LIMA-Dx (previous) 100 and CTO SVG to Cx and SVG to PDA. LAD 80-90% eccentric stenosis at SP1 that has 70% stenosis. Cx: Large OM and another distal branch with 90-95%  stenosis followed by lesion in the OM branch. RCA dominant 60-80% lesion proximally w/ several old lesions. PDA occluded 70-80% annular lesion in the proximal RCA. 3 of 4 grafts CTO RIMA-LAD patent  . CARDIAC CATHETERIZATION N/A 02/20/2015   Procedure: Left Heart Cath and Coronary Angiography;  Surgeon: Leonie Man, MD;  Location: [place] [facility]: Widely patent p-mRCA DES with dRCA 50%-PAVG 80% --> PCI. Patent overlapping stents --mCx-OM. 99% pLAD - patent RIMA-dLAD.  Known CTO SVG-rPL, SVG-OM3, :LIMA-D1.  Marland Kitchen CATARACT EXTRACTION W/ INTRAOCULAR LENS  IMPLANT, BILATERAL Bilateral ~ 2006/2007  . CORONARY ANGIOPLASTY WITH STENT PLACEMENT   04/28/2011; 02/20/2015   PCI-Cx-OM 3 overlapping Resolute DES 3 mm x55mm and 3x58mm --> 3.48mm; PCI to RCA - Promus Element DES 3x1mm --> 3.25mm;; b) dRCA-rPAV: Promus Premier DES 2.75mm X 28 mm (3.0 mm)   . CORONARY ARTERY BYPASS GRAFT  07/23/2004   LIMA-Cx RIMA-LAD, SVG-diagonal, SVG-PDA  . RIGHT/LEFT HEART CATH AND CORONARY/GRAFT ANGIOGRAPHY N/A 01/09/2018   Procedure: RIGHT/LEFT HEART CATH AND CORONARY/GRAFT ANGIOGRAPHY;  Surgeon: Leonie Man, MD;; Known occluded LIMA-Diag & SVG-RCA, SVG-OM.  Mod-severe pLAD that is grafted after D1 - patent RIMA- wraparound LAD, distal 1/2 of PDA territory. Patent Cx-OM DES (ost Cx mild) & p-mRCA overlapping DES & dRCA-RPAV DES.  PCWP 12 mmHg, LVP-EDP: 140/6 - 14 mmH. RAP 2 mmHg. RVP-EDP 33/0 - 5  mmHg  . TRANSTHORACIC ECHOCARDIOGRAM  02/18/2015    Mild concentric LVH, EF 55-60%, no RWMA, Gr 1 DD, aortic sclerosis without stenosis  . TRANSTHORACIC ECHOCARDIOGRAM  12/2017; 06/2019   (New LBBB)  EF~35% with diffuse HK of septal & lateral walls.  Mildly dilated LV.  "GR 1 "DD with high filling pressures. ? w/ normal LA size; 10/20: EF 30-35%, LBBB related Septal dyskinesis, GR2 DD (but normal LA size).   . TRANSTHORACIC ECHOCARDIOGRAM  11/27/2019   (On optimal/max tolerated dose of carvedilol and Entresto): EF estimated 30% with moderate to severe decreased function and global hypokinesis.  Significant septal-lateral dyssynchrony from LBBB.  Mild LV dilation.  At least GR 1 DD.  Mildly reduced RV function but normal pressures.  Mild aortic sclerosis but no significant regurgitation.  . TRANSURETHRAL RESECTION OF PROSTATE  ~ 2002/2003     Current Outpatient Medications  Medication Sig Dispense Refill  . ALPRAZolam (XANAX) 0.5 MG tablet Take 0.5 mg by mouth as directed.    Marland Kitchen aspirin EC 81 MG tablet Take 81 mg by mouth as directed.    Marland Kitchen BREO ELLIPTA 100-25 MCG/INH AEPB Inhale 1 puff into the lungs daily.    . budesonide-formoterol (SYMBICORT) 80-4.5 MCG/ACT  inhaler Inhale 2 puffs into the lungs every 4 (four) hours as needed (shortness of breath).    . carvedilol (COREG) 3.125 MG tablet Take 1 tablet (3.125 mg total) by mouth 2 (two) times daily with a meal. 180 tablet 3  . chlorzoxazone (PARAFON) 500 MG tablet Take 500 mg by mouth daily as needed for muscle spasms.    . mometasone (NASONEX) 50 MCG/ACT nasal spray Place 2 sprays into the nose daily as needed (congestion).    . nystatin cream (MYCOSTATIN) Apply 1 application topically as needed for rash.    . nystatin-triamcinolone (MYCOLOG II) cream Apply 1 application topically daily as needed (yeast infections).    Vladimir Faster Glycol-Propyl Glycol (SYSTANE OP) Place 1 drop into both eyes 2 (two) times daily.    . rosuvastatin (CRESTOR) 5 MG tablet Take 5 mg by mouth every evening.     Marland Kitchen  sacubitril-valsartan (ENTRESTO) 24-26 MG Take 1 tablet by mouth 2 (two) times daily. 60 tablet 3  . triamcinolone cream (KENALOG) 0.1 % Apply 1 application topically as needed.    . furosemide (LASIX) 20 MG tablet Take 1 tablet (20 mg total) by mouth as needed for edema. 90 tablet 0   No current facility-administered medications for this visit.    Allergies:   Meperidine hcl and Penicillins   Social History:  The patient  reports that he quit smoking about 56 years ago. His smoking use included cigarettes. He has a 7.00 pack-year smoking history. He has never used smokeless tobacco. He reports that he does not drink alcohol or use drugs.   Family History:  The patient's family history includes Colon cancer in his brother; Heart disease in his brother; Heart disease (age of onset: 38) in his father; Hypertension (age of onset: 12) in his brother; Hypertension (age of onset: 49) in his mother; Stomach cancer in his sister; Stroke (age of onset: 42) in his mother.    ROS:  Please see the history of present illness.   Otherwise, review of systems is positive for none.   All other systems are reviewed and negative.    PHYSICAL EXAM: VS:  BP 136/84   Pulse 77   Ht 5\' 7"  (1.702 m)   Wt 192 lb (87.1 kg)   BMI 30.07 kg/m  , BMI Body mass index is 30.07 kg/m. GEN: Well nourished, well developed, in no acute distress  HEENT: normal  Neck: no JVD, carotid bruits, or masses Cardiac: RRR; no murmurs, rubs, or gallops,no edema  Respiratory:  clear to auscultation bilaterally, normal work of breathing GI: soft, nontender, nondistended, + BS MS: no deformity or atrophy  Skin: warm and dry Neuro:  Strength and sensation are intact Psych: euthymic mood, full affect  EKG:  EKG is ordered today. Personal review of the ekg ordered shows sinus rhythm, atypical left bundle branch block, rate 77, QRS duration 158 ms   Recent Labs: 10/07/2019: BUN 11; Creatinine, Ser 0.94; Potassium 4.8; Sodium 140    Lipid Panel     Component Value Date/Time   CHOL 173 01/05/2018 1415   TRIG 144 01/05/2018 1415   HDL 50 01/05/2018 1415   CHOLHDL 3.5 01/05/2018 1415   CHOLHDL 3.3 08/05/2014 0844   VLDL 19 08/05/2014 0844   LDLCALC 94 01/05/2018 1415     Wt Readings from Last 3 Encounters:  12/30/19 192 lb (87.1 kg)  12/09/19 196 lb (88.9 kg)  09/19/19 191 lb 9.6 oz (86.9 kg)      Other studies Reviewed: Additional studies/ records that were reviewed today include: TTE 06/25/19  Review of the above records today demonstrates:  1. Left ventricular ejection fraction, by visual estimation, is 30 to 35%. The left ventricle has moderate to severely decreased function. Normal left ventricular size. There is mildly increased left ventricular hypertrophy.  2. Abnormal septal motion consistent with left bundle branch block.  3. Left ventricular diastolic Doppler parameters are consistent with pseudonormalization pattern of LV diastolic filling.  4. Elevated mean left atrial pressure.  5. Global right ventricle has normal systolic function.The right ventricular size is normal. No increase in right ventricular wall  thickness.  6. Left atrial size was normal.  7. Right atrial size was normal.  8. The mitral valve is normal in structure. No evidence of mitral valve regurgitation.  9. The tricuspid valve is normal in structure. Tricuspid valve regurgitation is trivial. 10. The  aortic valve is tricuspid Aortic valve regurgitation was not visualized by color flow Doppler. 11. The pulmonic valve was not well visualized. Pulmonic valve regurgitation is trivial by color flow Doppler. 12. The inferior vena cava is normal in size with greater than 50% respiratory variability, suggesting right atrial pressure of 3 mmHg.  Fertile 01/09/18  Previously placed Ost RCA to Mid RCA stent (DES) is widely patent. Previously placed Dist RCA- Post Atrio stent (DES) is widely patent.  Previously placed Prox Cx to Dist Cx stent (DES) is widely patent.  Ost LAD lesion is 80% stenosed. Prox LAD lesion is 60% stenosed. Ost 2nd Diag lesion is 85% stenosed.  _______________________________________________________________________________________________  RIMA graft was visualized by angiography and is large. The graft exhibits no disease. --It feels a wraparound LAD that perfuses the distal/apical half of the PDA.  SVG-RPDA graft was not injected. Origin lesion is 100% stenosed. Known CTO  SVG-OM graft was visualized by angiography. Origin to Prox Graft lesion is 100% stenosed. Known CTO  __________________________________________________________________________________________  LIMA-DIAG: (Not Injected) Origin to Prox Graft lesion is 100% stenosed. Known CTO  There is mild to moderate left ventricular systolic dysfunction. The left ventricular ejection fraction is 35-45% by visual estimate.  Normal right heart cath pressures with normal cardiac output/index.  LV end diastolic pressure is normal.  ASSESSMENT AND PLAN:  1.  Coronary artery disease: That is post CABG and PCI.  No current chest pain.  2.  Chronic systolic  heart failure: Action fraction remains low at 30 to 35%.  He is on both carvedilol and Entresto.  He would benefit from CRT-P implant.  Risks and benefits were discussed which were bleeding, tamponade, infection, pneumothorax.  He understands these risks and has agreed to the procedure.    3.  Hypertension: Currently well controlled  4.  PVCs: No obvious PVCs  Case discussed with primary cardiology  Current medicines are reviewed at length with the patient today.   The patient does not have concerns regarding his medicines.  The following changes were made today: None  Labs/ tests ordered today include:  Orders Placed This Encounter  Procedures  . Basic metabolic panel  . CBC  . EKG 12-Lead   Disposition:   FU with Jamaiya Tunnell 3 months  Signed, Timmie Dugue Meredith Leeds, MD  12/30/2019 4:50 PM     [facility] [address] [place] [address] [phone] (office) [phone] (fax)

## 2019-12-30 NOTE — Patient Instructions (Signed)
Medication Instructions:  Your physician recommends that you continue on your current medications as directed. Please refer to the Current Medication list given to you today.  *If you need a refill on your cardiac medications before your next appointment, please call your pharmacy*   Lab Work: None ordered If you have labs (blood work) drawn today and your tests are completely normal, you will receive your results only by: Marland Kitchen MyChart Message (if you have MyChart) OR . A paper copy in the mail If you have any lab test that is abnormal or we need to change your treatment, we will call you to review the results.   Testing/Procedures: CRT or cardiac resynchronization therapy is a treatment used to correct heart failure. When you have heart failure your heart is weakened and doesn't pump as well as it should. This therapy may help reduce symptoms and improve the quality of life.  Please see the handout/brochure given to you today to get more information of the different options of therapy.  Please see procedure implant instructions below, under "other instructions".   Follow-Up: At [facility], you and your health needs are our priority.  As part of our continuing mission to provide you with exceptional heart care, we have created designated Provider Care Teams.  These Care Teams include your primary Cardiologist (physician) and Advanced Practice Providers (APPs -  Physician Assistants and Nurse Practitioners) who all work together to provide you with the care you need, when you need it.  We recommend signing up for the patient portal called "MyChart".  Sign up information is provided on this After Visit Summary.  MyChart is used to connect with patients for Virtual Visits (Telemedicine).  Patients are able to view lab/test results, encounter notes, upcoming appointments, etc.  Non-urgent messages can be sent to your provider as well.   To learn more about what you can do with MyChart, go to  [email or web address].    Your next appointment:   3 month(s)  The format for your next appointment:   In Person  Provider:   Allegra Lai, MD   Thank you for choosing [facility]!!   Trinidad Curet, RN [phone]    Other Instructions   Implantable Device Instructions  You are scheduled for: Biventricular pacemaker implant on 02/10/2020 with Dr. [name].  1.   Pre procedure testing-             A.  LAB WORK--- between 01/13/20 - 02/05/2020.  You can stop by [employer] in [place].  You do not need to be fasting.              B. COVID TEST-- On 02/07/2020 @ 3:30 pm - You will go to [facility]. Stay in your car and the nurse team will come to your car to test you.  After you are tested please go home and self quarantine until the day of your procedure.    2. On the day of your procedure 02/10/2020 you will go to [facility] [phone]) at 10:00 am.  Dennis Bast will go to the main entrance A [employer]) and enter where the [employer] are.  You will check in at ADMITTING.  You may have one support person come in to the hospital with you.  They will be asked to wait in the waiting room.   3.   Do not eat or drink after midnight prior to your procedure.   4.   On the  morning of your procedure do NOT take any medication.  5.  The night before your procedure and the morning of your procedure scrub your neck/chest with surgical scrub.  An instruction letter is included below.   5.  Plan for an overnight stay.  If you use your phone frequently bring your phone charger.  When you are discharged you will need someone to drive you home.   6.  You will follow up with the [place] clinic 10-14 days after your procedure. You will follow up with Dr. [name] 91 days after your procedure.  These appointments will be made for you.   * If you have ANY questions after you get home, please call the office [phone] and ask for Bralon Antkowiak RN or send a MyChart  message.    [facility] - Preparing For Surgery  Before surgery, you can play an important role. Because skin is not sterile, your skin needs to be as free of germs as possible. You can reduce the number of germs on your skin by washing with CHG (chlorahexidine gluconate) Soap before surgery.  CHG is an antiseptic cleaner which kills germs and bonds with the skin to continue killing germs even after washing.   Please do not use if you have an allergy to CHG or antibacterial soaps.  If your skin becomes reddened/irritated stop using the CHG.   Do not shave (including legs and underarms) for at least 48 hours prior to first CHG shower.  It is OK to shave your face.  Please follow these instructions carefully:  1.  Shower the night before surgery and the morning of surgery with CHG.  2.  If you choose to wash your hair, wash your hair first as usual with your normal shampoo.  3.  After you shampoo, rinse your hair and body thoroughly to remove the shampoo.  4.  Use CHG as you would any other liquid soap.  You can apply CHG directly to the skin and wash gently with a clean washcloth. 5.  Apply the CHG Soap to your body ONLY FROM THE NECK DOWN.  Do not use on open wounds or open sores.  Avoid contact with your eyes, ears, mouth and genitals (private parts).  Wash genitals (private parts) with your normal soap.  6.  Wash thoroughly, paying special attention to the area where your surgery will be performed.  7.  Thoroughly rinse your body with warm water from the neck down.   8.  DO NOT shower/wash with your normal soap after using and rinsing off the CHG soap.  9.  Pat yourself dry with a clean towel.           10.  Wear clean pajamas.           11.  Place clean sheets on your bed the night of your first shower and do not sleep with pets.  Day of Surgery: Do not apply any deodorants/lotions.  Please wear clean clothes to the hospital/surgery center.     Biventricular Pacemaker Implantation A  biventricular pacemaker implantation is a procedure to place (implant) a pacemaker near the heart. A pacemaker is a small, battery-powered device that helps control the heartbeat. If the heart beats irregularly or too slowly (bradycardia), the pacemaker will pace the heart so that it beats at a normal rate or a programmed rate. The parts of a biventricular pacemaker include:  The pulse generator. The pulse generator contains a small computer and a memory  system that is programmed to keep the heart beating at a certain rate. The pulse generator also produces the electrical signal that triggers the heart to beat. This is implanted under the skin of the upper chest, near the collarbone.  Wires (leads). There may be two or three leads placed in the heart--one to the right atrium, one to the right ventricle, and one through the coronary sinus to reach the left ventricle of the heart. The leads are connected to the pulse generator. They transmit electrical pulses from the pulse generator to the heart. A biventricular pacemaker is used in people with heart failure due to weak heart muscles. The pacemaker can help the heart chambers pump more efficiently. This procedure may be done to treat:  Symptoms of severe heart failure, such as shortness of breath (dyspnea).  Loss of consciousness that happens repeatedly (syncope) because of an irregular heart rate. Tell a health care provider about:  Any allergies you have.  All medicines you are taking, including vitamins, herbs, eye drops, creams, and over-the-counter medicines.  Any problems you or family members have had with anesthetic medicines.  Any blood disorders you have.  Any surgeries you have had.  Any medical conditions you have.  Whether you are pregnant or may be pregnant. What are the risks? Generally, this is a safe procedure. However, problems may occur, including:  Infection.  Swelling, bruising, or bleeding at the pacemaker site,  especially if you take blood thinners.  Allergic reactions to medicines or dyes.  Damage to blood vessels or nerves near the pacemaker.  Failure of the pacemaker to improve your condition.  Collapsed lung.  Blood clots.  Lead failures. This may require more surgery. What happens before the procedure? Medicines Ask your health care provider about:  Changing or stopping your regular medicines. This is especially important if you are taking diabetes medicines or blood thinners.  Taking medicines such as aspirin and ibuprofen. These medicines can thin your blood. Do not take these medicines unless your health care provider tells you to take them.  Taking over-the-counter medicines, vitamins, herbs, and supplements. Eating and drinking Follow instructions from your health care provider about eating and drinking, which may include:  8 hours before the procedure - stop eating heavy meals or foods, such as meat, fried foods, or fatty foods.  6 hours before the procedure - stop eating light meals or foods, such as toast or cereal.  6 hours before the procedure - stop drinking milk or drinks that contain milk.  2 hours before the procedure - stop drinking clear liquids. Staying hydrated Follow instructions from your health care provider about hydration, which may include:  Up to 2 hours before the procedure - you may continue to drink clear liquids, such as water, clear fruit juice, black coffee, and plain tea.  General instructions  Do not use any products that contain nicotine or tobacco for at least 4 weeks before the procedure. These products include cigarettes, e-cigarettes, and chewing tobacco. If you need help quitting, ask your health care provider.  You may have tests, including: ? Blood tests. ? Chest X-rays. ? Echocardiogram. This is a test that uses sound waves (ultrasound) to produce an image of the heart.  Ask your health care provider: ? How your surgery site will  be marked. ? What steps will be taken to help prevent infection. These may include:  Removing hair at the surgery site.  Washing skin with a germ-killing soap.  Taking antibiotic medicine.  Plan to have someone take you home from the hospital or clinic.  If you will be going home right after the procedure, plan to have someone with you for 24 hours. What happens during the procedure?   An IV will be inserted into one of your veins.  You will be connected to a heart monitor. Large electrode pads will be placed on the front and back of your chest.  You will be given one or more of the following: ? A medicine to help you relax (sedative). ? A medicine to make you fall asleep (general anesthetic). ? A medicine that is injected into an area of your body to numb the area (local anesthetic).  An incision will be made in your upper chest, near your heart.  The leads will be guided into your incision, through your blood vessels, and into your heart. Your health care provider will use an X-ray machine (fluoroscope) to guide the leads into your heart.  The leads will be attached to your heart muscles and to the pulse generator.  The leads will be tested to make sure that they work correctly.  The pulse generator will be implanted under your skin, near your incision.  The heart monitor will be watched to ensure that the pacer is working correctly.  Your incision will be closed with stitches (sutures), skin glue, or adhesive tape.  A bandage (dressing) will be placed over your incision. The procedure may vary among health care providers and hospitals. What happens after the procedure?  Your blood pressure, heart rate, breathing rate, and blood oxygen level will be monitored until you leave the hospital or clinic.  You may continue to receive fluids and medicines through an IV.  You will be given pain medicine as needed.  You will have a chest X-ray done. This is to make sure that  your pacemaker is in the right place.  You will be given a pacemaker identification card. This card lists the implant date, device model, and manufacturer of your pacemaker.  Do not drive for 24 hours if you were given a sedative during your procedure. Summary  A pacemaker is a small, battery-powered device that helps control the heartbeat.  A biventricular pacemaker is used in people with heart failure due to weak heart muscles.  Follow instructions from your health care provider about taking medicines and about eating and drinking before the procedure.  You will be given a pacemaker identification card that lists the implant date, device model, and manufacturer of your pacemaker. This information is not intended to replace advice given to you by your health care provider. Make sure you discuss any questions you have with your health care provider. Document Revised: 07/25/2018 Document Reviewed: 07/25/2018 Elsevier Patient Education  2020 [employer].

## 2020-01-20 DIAGNOSIS — N200 Calculus of kidney: Secondary | ICD-10-CM | POA: Diagnosis not present

## 2020-01-20 DIAGNOSIS — N529 Male erectile dysfunction, unspecified: Secondary | ICD-10-CM | POA: Diagnosis not present

## 2020-01-20 DIAGNOSIS — N281 Cyst of kidney, acquired: Secondary | ICD-10-CM | POA: Diagnosis not present

## 2020-01-20 DIAGNOSIS — N35013 Post-traumatic anterior urethral stricture: Secondary | ICD-10-CM | POA: Diagnosis not present

## 2020-01-20 DIAGNOSIS — N401 Enlarged prostate with lower urinary tract symptoms: Secondary | ICD-10-CM | POA: Diagnosis not present

## 2020-01-20 DIAGNOSIS — N138 Other obstructive and reflux uropathy: Secondary | ICD-10-CM | POA: Diagnosis not present

## 2020-01-20 DIAGNOSIS — R972 Elevated prostate specific antigen [PSA]: Secondary | ICD-10-CM | POA: Diagnosis not present

## 2020-01-30 ENCOUNTER — Telehealth: Payer: Self-pay | Admitting: *Deleted

## 2020-01-30 DIAGNOSIS — I5022 Chronic systolic (congestive) heart failure: Secondary | ICD-10-CM | POA: Diagnosis not present

## 2020-01-30 DIAGNOSIS — Z01812 Encounter for preprocedural laboratory examination: Secondary | ICD-10-CM | POA: Diagnosis not present

## 2020-01-30 LAB — BASIC METABOLIC PANEL
BUN/Creatinine Ratio: 14 (ref 10–24)
BUN: 12 mg/dL (ref 8–27)
CO2: 25 mmol/L (ref 20–29)
Calcium: 9.6 mg/dL (ref 8.6–10.2)
Chloride: 102 mmol/L (ref 96–106)
Creatinine, Ser: 0.86 mg/dL (ref 0.76–1.27)
GFR calc Af Amer: 95 mL/min/{1.73_m2} (ref >59)
GFR calc non Af Amer: 82 mL/min/{1.73_m2} (ref >59)
Glucose: 113 mg/dL — ABNORMAL HIGH (ref 65–99)
Potassium: 5.1 mmol/L (ref 3.5–5.2)
Sodium: 140 mmol/L (ref 134–144)

## 2020-01-30 LAB — CBC
Hematocrit: 45.4 % (ref 37.5–51.0)
Hemoglobin: 15.2 g/dL (ref 13.0–17.7)
MCH: 28 pg (ref 26.6–33.0)
MCHC: 33.5 g/dL (ref 31.5–35.7)
MCV: 84 fL (ref 79–97)
Platelets: 269 10*3/uL (ref 150–450)
RBC: 5.42 x10E6/uL (ref 4.14–5.80)
RDW: 13.1 % (ref 11.6–15.4)
WBC: 9.7 10*3/uL (ref 3.4–10.8)

## 2020-01-30 NOTE — Telephone Encounter (Signed)
Pt advised to arrive at 9:30 am on 6/7 for ICD implant, not 10 as originally instructed. Patient verbalized understanding and agreeable to plan.

## 2020-02-05 NOTE — Telephone Encounter (Signed)
Pt wanted to know what time procedure starts. Informed scheduled to start at 11:30 but to arrive to [facility] at 9:30 am. Patient verbalized understanding and agreeable to plan.

## 2020-02-05 NOTE — Telephone Encounter (Signed)
Patient is calling to follow up in regards to instructions for procedure scheduled for 02/10/20. He states he has additional questions for Dr. Macky Lower nurse, Venida Jarvis. Please call

## 2020-02-07 ENCOUNTER — Other Ambulatory Visit (HOSPITAL_COMMUNITY)
Admission: RE | Admit: 2020-02-07 | Discharge: 2020-02-07 | Disposition: A | Discharge status: Home / Self Care | Payer: Medicare Other | Source: Ambulatory Visit | Attending: Cardiology | Admitting: Cardiology

## 2020-02-07 ENCOUNTER — Other Ambulatory Visit: Payer: Self-pay

## 2020-02-07 DIAGNOSIS — Z20822 Contact with and (suspected) exposure to covid-19: Secondary | ICD-10-CM | POA: Diagnosis not present

## 2020-02-07 DIAGNOSIS — Z01812 Encounter for preprocedural laboratory examination: Secondary | ICD-10-CM | POA: Insufficient documentation

## 2020-02-07 NOTE — Telephone Encounter (Signed)
Pt made aware, d/t scheduling change, to arrive at 7:30 am Monday for his procedure, not 9:30 as previously instructed. Patient verbalized understanding and agreeable to plan.

## 2020-02-08 LAB — SARS CORONAVIRUS 2 (TAT 6-24 HRS): SARS Coronavirus 2: NEGATIVE

## 2020-02-10 ENCOUNTER — Other Ambulatory Visit: Payer: Self-pay

## 2020-02-10 ENCOUNTER — Ambulatory Visit (HOSPITAL_COMMUNITY)
Admission: RE | Admit: 2020-02-10 | Discharge: 2020-02-10 | Disposition: A | Discharge status: Home / Self Care | Payer: Medicare Other | Attending: Cardiology | Admitting: Cardiology

## 2020-02-10 ENCOUNTER — Ambulatory Visit (HOSPITAL_COMMUNITY)
Admission: RE | Disposition: A | Discharge status: Home / Self Care | Payer: Self-pay | Source: Home / Self Care | Attending: Cardiology

## 2020-02-10 ENCOUNTER — Ambulatory Visit (HOSPITAL_COMMUNITY): Payer: Medicare Other

## 2020-02-10 DIAGNOSIS — Z7982 Long term (current) use of aspirin: Secondary | ICD-10-CM | POA: Insufficient documentation

## 2020-02-10 DIAGNOSIS — I5022 Chronic systolic (congestive) heart failure: Secondary | ICD-10-CM | POA: Diagnosis not present

## 2020-02-10 DIAGNOSIS — I2510 Atherosclerotic heart disease of native coronary artery without angina pectoris: Secondary | ICD-10-CM | POA: Diagnosis not present

## 2020-02-10 DIAGNOSIS — Z87891 Personal history of nicotine dependence: Secondary | ICD-10-CM | POA: Insufficient documentation

## 2020-02-10 DIAGNOSIS — I252 Old myocardial infarction: Secondary | ICD-10-CM | POA: Diagnosis not present

## 2020-02-10 DIAGNOSIS — I110 Hypertensive heart disease with heart failure: Secondary | ICD-10-CM | POA: Diagnosis not present

## 2020-02-10 DIAGNOSIS — Z79899 Other long term (current) drug therapy: Secondary | ICD-10-CM | POA: Insufficient documentation

## 2020-02-10 DIAGNOSIS — Z950 Presence of cardiac pacemaker: Secondary | ICD-10-CM

## 2020-02-10 DIAGNOSIS — I447 Left bundle-branch block, unspecified: Secondary | ICD-10-CM | POA: Insufficient documentation

## 2020-02-10 DIAGNOSIS — J45909 Unspecified asthma, uncomplicated: Secondary | ICD-10-CM | POA: Diagnosis not present

## 2020-02-10 DIAGNOSIS — K219 Gastro-esophageal reflux disease without esophagitis: Secondary | ICD-10-CM | POA: Diagnosis not present

## 2020-02-10 DIAGNOSIS — I255 Ischemic cardiomyopathy: Secondary | ICD-10-CM | POA: Insufficient documentation

## 2020-02-10 DIAGNOSIS — M109 Gout, unspecified: Secondary | ICD-10-CM | POA: Insufficient documentation

## 2020-02-10 DIAGNOSIS — Z951 Presence of aortocoronary bypass graft: Secondary | ICD-10-CM | POA: Insufficient documentation

## 2020-02-10 DIAGNOSIS — E785 Hyperlipidemia, unspecified: Secondary | ICD-10-CM | POA: Insufficient documentation

## 2020-02-10 DIAGNOSIS — Z95818 Presence of other cardiac implants and grafts: Secondary | ICD-10-CM

## 2020-02-10 HISTORY — DX: Presence of cardiac pacemaker: Z95.0

## 2020-02-10 HISTORY — PX: BIV PACEMAKER INSERTION CRT-P: EP1199

## 2020-02-10 SURGERY — BIV PACEMAKER INSERTION CRT-P

## 2020-02-10 MED ORDER — VANCOMYCIN HCL IN DEXTROSE 1-5 GM/200ML-% IV SOLN
1000.0000 mg | INTRAVENOUS | Status: AC
  Administered 2020-02-10: 1000 mg via INTRAVENOUS
  Filled 2020-02-10: qty 200

## 2020-02-10 MED ORDER — FENTANYL CITRATE (PF) 100 MCG/2ML IJ SOLN
INTRAMUSCULAR | Status: DC | PRN
  Administered 2020-02-10 (×2): 12.5 ug via INTRAVENOUS
  Administered 2020-02-10: 25 ug via INTRAVENOUS
  Administered 2020-02-10: 12.5 ug via INTRAVENOUS
  Administered 2020-02-10: 25 ug via INTRAVENOUS

## 2020-02-10 MED ORDER — VANCOMYCIN HCL IN DEXTROSE 1-5 GM/200ML-% IV SOLN: 1000.0000 mg | Freq: Two times a day (BID) | INTRAVENOUS | Status: DC

## 2020-02-10 MED ORDER — LIDOCAINE HCL 1 % IJ SOLN
INTRAMUSCULAR | Status: AC
  Filled 2020-02-10: qty 20

## 2020-02-10 MED ORDER — TRAMADOL HCL 50 MG PO TABS
50.0000 mg | ORAL_TABLET | Freq: Once | ORAL | Status: AC
  Administered 2020-02-10: 50 mg via ORAL
  Filled 2020-02-10 (×2): qty 1

## 2020-02-10 MED ORDER — SODIUM CHLORIDE 0.9 % IV SOLN
80.0000 mg | INTRAVENOUS | Status: AC
  Administered 2020-02-10: 80 mg
  Filled 2020-02-10: qty 2

## 2020-02-10 MED ORDER — SODIUM CHLORIDE 0.9 % IV SOLN
INTRAVENOUS | Status: AC
  Filled 2020-02-10: qty 2

## 2020-02-10 MED ORDER — MIDAZOLAM HCL 5 MG/5ML IJ SOLN
INTRAMUSCULAR | Status: DC | PRN
  Administered 2020-02-10 (×4): 1 mg via INTRAVENOUS
  Administered 2020-02-10: 2 mg via INTRAVENOUS
  Administered 2020-02-10: 1 mg via INTRAVENOUS

## 2020-02-10 MED ORDER — ACETAMINOPHEN 325 MG PO TABS: 325.0000 mg | ORAL_TABLET | ORAL | Status: DC | PRN

## 2020-02-10 MED ORDER — CHLORHEXIDINE GLUCONATE 4 % EX LIQD
4.0000 "application " | Freq: Once | CUTANEOUS | Status: DC
  Filled 2020-02-10: qty 60

## 2020-02-10 MED ORDER — LIDOCAINE HCL (PF) 1 % IJ SOLN
INTRAMUSCULAR | Status: DC | PRN
  Administered 2020-02-10: 70 mL

## 2020-02-10 MED ORDER — FENTANYL CITRATE (PF) 100 MCG/2ML IJ SOLN
INTRAMUSCULAR | Status: AC
  Filled 2020-02-10: qty 2

## 2020-02-10 MED ORDER — MIDAZOLAM HCL 5 MG/5ML IJ SOLN
INTRAMUSCULAR | Status: AC
  Filled 2020-02-10: qty 5

## 2020-02-10 MED ORDER — HEPARIN (PORCINE) IN NACL 1000-0.9 UT/500ML-% IV SOLN
INTRAVENOUS | Status: AC
  Filled 2020-02-10: qty 500

## 2020-02-10 MED ORDER — SODIUM CHLORIDE 0.9 % IV SOLN: INTRAVENOUS | Status: DC

## 2020-02-10 MED ORDER — HEPARIN (PORCINE) IN NACL 1000-0.9 UT/500ML-% IV SOLN
INTRAVENOUS | Status: DC | PRN
  Administered 2020-02-10: 500 mL

## 2020-02-10 MED ORDER — VANCOMYCIN HCL IN DEXTROSE 1-5 GM/200ML-% IV SOLN
INTRAVENOUS | Status: AC
  Filled 2020-02-10: qty 200

## 2020-02-10 MED ORDER — IOHEXOL 350 MG/ML SOLN
INTRAVENOUS | Status: DC | PRN
  Administered 2020-02-10: 15 mL
  Administered 2020-02-10: 5 mL

## 2020-02-10 MED ORDER — LIDOCAINE HCL 1 % IJ SOLN
INTRAMUSCULAR | Status: AC
  Filled 2020-02-10: qty 60

## 2020-02-10 MED ORDER — ONDANSETRON HCL 4 MG/2ML IJ SOLN: 4.0000 mg | Freq: Four times a day (QID) | INTRAMUSCULAR | Status: DC | PRN

## 2020-02-10 SURGICAL SUPPLY — 16 items
BALLN COR SINUS VENO 6F 80CM (BALLOONS) ×1
BALLN COR SINUS VENO 6FR 80 (BALLOONS) ×2
BALLOON COR SINUS VENO 6FR 80 (BALLOONS) IMPLANT
CABLE SURGICAL S-101-97-12 (CABLE) ×3 IMPLANT
CATH CPS DIRECT 135 DS2C020 (CATHETERS) ×2 IMPLANT
CPS IMPLANT KIT 410190 (MISCELLANEOUS) ×2 IMPLANT
LEAD QUARTET 1458Q-86CM (Lead) ×2 IMPLANT
LEAD TENDRIL MRI 52CM LPA1200M (Lead) ×2 IMPLANT
LEAD TENDRIL MRI 58CM LPA1200M (Lead) ×2 IMPLANT
PACEMAKER QUDR ALLR CRT PM3562 (Pacemaker) IMPLANT
PAD PRO RADIOLUCENT 2001M-C (PAD) ×3 IMPLANT
PMKR QUADRA ALLURE CRT PM3562 (Pacemaker) ×3 IMPLANT
SHEATH 8FR PRELUDE SNAP 13 (SHEATH) ×4 IMPLANT
TRAY PACEMAKER INSERTION (PACKS) ×3 IMPLANT
WIRE ACUITY WHISPER EDS 4648 (WIRE) ×2 IMPLANT
WIRE HI TORQ VERSACORE-J 145CM (WIRE) ×2 IMPLANT

## 2020-02-10 NOTE — H&P (Addendum)
[name] has presented today for surgery, with the diagnosis of CHF, LBBB.  The various methods of treatment have been discussed with the patient and family. After consideration of risks, benefits and other options for treatment, the patient has consented to  Procedure(s): CRT-P implant as a surgical intervention .  Risks include but not limited to bleeding, tamponade, infection, pneumothorax, among others. The patient's history has been reviewed, patient examined, no change in status, stable for surgery.  I have reviewed the patient's chart and labs.  Questions were answered to the patient's satisfaction.    Allegra Lai, MD 02/10/2020 7:47 AM      Electrophysiology Office Note   Date:  02/12/2020   ID:  [name], [name] 03/30/1941, MRN [id number]  PCP:  [name], MD  Cardiologist:  Ellyn Hack Primary Electrophysiologist:  Darrnell Mangiaracina Meredith Leeds, MD    Chief Complaint: CHF   History of Present Illness: [name] is a 79 y.o. male who is being seen today for the evaluation of CHF at the request of No ref. provider found. Presenting today for electrophysiology evaluation.  He has a history of coronary artery disease status post CABG, PVCs, CHF, hypertension, hyperlipidemia.  Today is a weakness and fatigue.  He feels like he does not have the energy to do exactly what he wants to do.  Today, denies symptoms of palpitations, chest pain, shortness of breath, orthopnea, PND, lower extremity edema, claudication, dizziness, presyncope, syncope, bleeding, or neurologic sequela. The patient is tolerating medications without difficulties. Plan pacemaker implant    Past Medical History:  Diagnosis Date  . Allergic rhinitis   . Arthritis    "entire body" (02/19/2015)  . Asthma   . Asthmatic bronchitis   . CAD of autologous vein bypass graft without angina 04/2011   Frequent PVCs & fatigue -> MYOVIEW w/ inferior /r MV distribution --> CATH: only RIMA-LAD patent (LIMA atretic & SVGs occluded)  --> Staged PCI of Native RCA & Cx; followed by PCI-distal RCA  . CAD S/P percutaneous coronary angioplasty 8/'12; 06/'16   a) PCI- Cx-OM W/ 2 overlapping Resolute DES 3x15 & 3x18 stents (3.45mm),  PCI-RCA -  Promus DES 3x38 (3.21mm);; b) 6/'16: PCI-dRCA-rPAV Promus DES 2.75X28 (3.0 mm) -- stents patent in 01/2018;  01/2018 -stents remain patent  . CAS (cerebral atherosclerosis)    CAROTID DOPPLER, 08/25/2011 - mildly abnormal  . Chronic back pain   . Dilated cardiomyopathy ([place]): EF ~35%, 01/05/2018   TTE April 2019: EF~35% with diffuse HK - septal and lateral walls.  Mildly dilated LV.  "GR 1 "DD with high filling pressures. ?  Left atrium normal size --> new LBBB - no change to Coronary Anatony on Cath; -> EF now 30 to 35% as of October 2020  . Diverticulosis   . Erectile dysfunction   . Essential hypertension    Medication intolerant -- only able to take minimal doses of medications  . Family hx of colon cancer   . GERD (gastroesophageal reflux disease)   . Gout   . Hemorrhoids   . Hyperlipidemia with target LDL less than 70    Only able to tolerate very low dose statin - no interest in other options  . Kidney stones   . Myocardial infarction ([place]) 2005  . Polycythemia   . 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 Symptomatic PVCs   .  Syncope 2010   ? unclear of etiology   Past Surgical History:  Procedure Laterality Date  . BIV PACEMAKER INSERTION CRT-P N/A 02/10/2020   Procedure: BIV PACEMAKER INSERTION CRT-P;  Surgeon: Constance Haw, MD;  Location: [facility];  Service: Cardiovascular;  Laterality: N/A;  . CARDIAC CATHETERIZATION  07/15/2004   CABG recommended; continue medical therapy  . CARDIAC CATHETERIZATION  04/26/2011   Patent RIMA-LAD, atretic LIMA-Dx (previous) 100 and CTO SVG to Cx and SVG to PDA. LAD 80-90% eccentric stenosis at SP1 that has 70% stenosis. Cx: Large  OM and another distal branch with 90-95% stenosis followed by lesion in the OM branch. RCA dominant 60-80% lesion proximally w/ several old lesions. PDA occluded 70-80% annular lesion in the proximal RCA. 3 of 4 grafts CTO RIMA-LAD patent  . CARDIAC CATHETERIZATION N/A 02/20/2015   Procedure: Left Heart Cath and Coronary Angiography;  Surgeon: Leonie Man, MD;  Location: [facility]: Widely patent p-mRCA DES with dRCA 50%-PAVG 80% --> PCI. Patent overlapping stents --mCx-OM. 99% pLAD - patent RIMA-dLAD.  Known CTO SVG-rPL, SVG-OM3, :LIMA-D1.  Marland Kitchen CATARACT EXTRACTION W/ INTRAOCULAR LENS  IMPLANT, BILATERAL Bilateral ~ 2006/2007  . CORONARY ANGIOPLASTY WITH STENT PLACEMENT  04/28/2011; 02/20/2015   PCI-Cx-OM 3 overlapping Resolute DES 3 mm x34mm and 3x65mm --> 3.64mm; PCI to RCA - Promus Element DES 3x32mm --> 3.73mm;; b) dRCA-rPAV: Promus Premier DES 2.82mm X 28 mm (3.0 mm)   . CORONARY ARTERY BYPASS GRAFT  07/23/2004   LIMA-Cx RIMA-LAD, SVG-diagonal, SVG-PDA  . RIGHT/LEFT HEART CATH AND CORONARY/GRAFT ANGIOGRAPHY N/A 01/09/2018   Procedure: RIGHT/LEFT HEART CATH AND CORONARY/GRAFT ANGIOGRAPHY;  Surgeon: Leonie Man, MD;; Known occluded LIMA-Diag & SVG-RCA, SVG-OM.  Mod-severe pLAD that is grafted after D1 - patent RIMA- wraparound LAD, distal 1/2 of PDA territory. Patent Cx-OM DES (ost Cx mild) & p-mRCA overlapping DES & dRCA-RPAV DES.  PCWP 12 mmHg, LVP-EDP: 140/6 - 14 mmH. RAP 2 mmHg. RVP-EDP 33/0 - 5  mmHg  . TRANSTHORACIC ECHOCARDIOGRAM  02/18/2015    Mild concentric LVH, EF 55-60%, no RWMA, Gr 1 DD, aortic sclerosis without stenosis  . TRANSTHORACIC ECHOCARDIOGRAM  12/2017; 06/2019   (New LBBB)  EF~35% with diffuse HK of septal & lateral walls.  Mildly dilated LV.  "GR 1 "DD with high filling pressures. ? w/ normal LA size; 10/20: EF 30-35%, LBBB related Septal dyskinesis, GR2 DD (but normal LA size).   . TRANSTHORACIC ECHOCARDIOGRAM  11/27/2019   (On optimal/max tolerated dose of  carvedilol and Entresto): EF estimated 30% with moderate to severe decreased function and global hypokinesis.  Significant septal-lateral dyssynchrony from LBBB.  Mild LV dilation.  At least GR 1 DD.  Mildly reduced RV function but normal pressures.  Mild aortic sclerosis but no significant regurgitation.  . TRANSURETHRAL RESECTION OF PROSTATE  ~ 2002/2003     No current facility-administered medications for this encounter.   Current Outpatient Medications  Medication Sig Dispense Refill  . ALPRAZolam (XANAX) 0.5 MG tablet Take 0.5 mg by mouth 2 (two) times daily as needed for anxiety.     Marland Kitchen aspirin EC 81 MG tablet Take 81 mg by mouth daily.     Marland Kitchen BREO ELLIPTA 100-25 MCG/INH AEPB Inhale 1 puff into the lungs daily.    . carvedilol (COREG) 3.125 MG tablet Take 1 tablet (3.125 mg total) by mouth 2 (two) times daily with a meal. 180 tablet 3  . chlorzoxazone (PARAFON) 500 MG tablet Take 500 mg  by mouth daily as needed for muscle spasms.    . furosemide (LASIX) 20 MG tablet Take 1 tablet (20 mg total) by mouth as needed for edema. 90 tablet 0  . nystatin cream (MYCOSTATIN) Apply 1 application topically daily as needed (rash).     . nystatin-triamcinolone (MYCOLOG II) cream Apply 1 application topically daily as needed (yeast infections).    Vladimir Faster Glycol-Propyl Glycol (SYSTANE OP) Place 1 drop into both eyes 2 (two) times daily.    . rosuvastatin (CRESTOR) 5 MG tablet Take 5 mg by mouth every evening.     . sacubitril-valsartan (ENTRESTO) 24-26 MG Take 1 tablet by mouth 2 (two) times daily. 60 tablet 3  . triamcinolone cream (KENALOG) 0.1 % Apply 1 application topically daily as needed (irritation).       Allergies:   Meperidine hcl and Penicillins   Social History:  The patient  reports that he quit smoking about 56 years ago. His smoking use included cigarettes. He has a 7.00 pack-year smoking history. He has never used smokeless tobacco. He reports that he does not drink alcohol or use  drugs.   Family History:  The patient's family history includes Colon cancer in his brother; Heart disease in his brother; Heart disease (age of onset: 43) in his father; Hypertension (age of onset: 76) in his brother; Hypertension (age of onset: 86) in his mother; Stomach cancer in his sister; Stroke (age of onset: 24) in his mother.    ROS:  Please see the history of present illness.   Otherwise, review of systems is positive for none.   All other systems are reviewed and negative.   PHYSICAL EXAM: VS:  BP (!) 179/88   Pulse 74   Temp 98 F (36.7 C) (Oral)   Resp 18   Ht 5\' 7"  (1.702 m)   Wt 84.4 kg   SpO2 97%   BMI 29.13 kg/m  , BMI Body mass index is 29.13 kg/m. GEN: Well nourished, well developed, in no acute distress  HEENT: normal  Neck: no JVD, carotid bruits, or masses Cardiac: RRR; no murmurs, rubs, or gallops,no edema  Respiratory:  clear to auscultation bilaterally, normal work of breathing GI: soft, nontender, nondistended, + BS MS: no deformity or atrophy  Skin: warm and dry Neuro:  Strength and sensation are intact Psych: euthymic mood, full affect;h    Recent Labs: 01/30/2020: BUN 12; Creatinine, Ser 0.86; Hemoglobin 15.2; Platelets 269; Potassium 5.1; Sodium 140    Lipid Panel     Component Value Date/Time   CHOL 173 01/05/2018 1415   TRIG 144 01/05/2018 1415   HDL 50 01/05/2018 1415   CHOLHDL 3.5 01/05/2018 1415   CHOLHDL 3.3 08/05/2014 0844   VLDL 19 08/05/2014 0844   LDLCALC 94 01/05/2018 1415     Wt Readings from Last 3 Encounters:  02/10/20 84.4 kg  12/30/19 87.1 kg  12/09/19 88.9 kg      Other studies Reviewed: Additional studies/ records that were reviewed today include: TTE 06/25/19  Review of the above records today demonstrates:  1. Left ventricular ejection fraction, by visual estimation, is 30 to 35%. The left ventricle has moderate to severely decreased function. Normal left ventricular size. There is mildly increased left  ventricular hypertrophy.  2. Abnormal septal motion consistent with left bundle branch block.  3. Left ventricular diastolic Doppler parameters are consistent with pseudonormalization pattern of LV diastolic filling.  4. Elevated mean left atrial pressure.  5. Global right ventricle has  normal systolic function.The right ventricular size is normal. No increase in right ventricular wall thickness.  6. Left atrial size was normal.  7. Right atrial size was normal.  8. The mitral valve is normal in structure. No evidence of mitral valve regurgitation.  9. The tricuspid valve is normal in structure. Tricuspid valve regurgitation is trivial. 10. The aortic valve is tricuspid Aortic valve regurgitation was not visualized by color flow Doppler. 11. The pulmonic valve was not well visualized. Pulmonic valve regurgitation is trivial by color flow Doppler. 12. The inferior vena cava is normal in size with greater than 50% respiratory variability, suggesting right atrial pressure of 3 mmHg.  Norco 01/09/18  Previously placed Ost RCA to Mid RCA stent (DES) is widely patent. Previously placed Dist RCA- Post Atrio stent (DES) is widely patent.  Previously placed Prox Cx to Dist Cx stent (DES) is widely patent.  Ost LAD lesion is 80% stenosed. Prox LAD lesion is 60% stenosed. Ost 2nd Diag lesion is 85% stenosed.  _______________________________________________________________________________________________  RIMA graft was visualized by angiography and is large. The graft exhibits no disease. --It feels a wraparound LAD that perfuses the distal/apical half of the PDA.  SVG-RPDA graft was not injected. Origin lesion is 100% stenosed. Known CTO  SVG-OM graft was visualized by angiography. Origin to Prox Graft lesion is 100% stenosed. Known CTO  __________________________________________________________________________________________  LIMA-DIAG: (Not Injected) Origin to Prox Graft lesion is 100% stenosed.  Known CTO  There is mild to moderate left ventricular systolic dysfunction. The left ventricular ejection fraction is 35-45% by visual estimate.  Normal right heart cath pressures with normal cardiac output/index.  LV end diastolic pressure is normal.  ASSESSMENT AND PLAN:  1.  Coronary artery disease: That is post CABG and PCI.  No current chest pain.  2.  Chronic systolic heart failure: plan for pacemaker implant as above.

## 2020-02-10 NOTE — Progress Notes (Signed)
Patient and wife were given discharge instructions. Both verbalized understanding. 

## 2020-02-10 NOTE — Discharge Instructions (Signed)
After Your Pacemaker    You have a [employer] Pacemaker  ACTIVITY  Do not lift your arm above shoulder height for 1 week after your procedure. After 7 days, you may progress as below.     Monday February 17, 2020  Tuesday February 18, 2020 Wednesday February 19, 2020 Thursday February 20, 2020    Do not lift, push, pull, or carry anything over 10 pounds with the affected arm until 6 weeks (Monday March 23, 2020 ) after your procedure.    Do NOT DRIVE until you have been seen for your wound check, or as long as instructed by your healthcare provider.    Ask your healthcare provider when you can go back to work   INCISION/Dressing  If you are on a blood thinner such as Coumadin, Xarelto, Eliquis, Plavix, or Pradaxa please confirm with your provider when this should be resumed.    Monitor your Pacemaker site for redness, swelling, and drainage. Call the device clinic at [phone] if you experience these symptoms or fever/chills.   If your incision is sealed with Steri-strips or staples, you may shower 10 days after your procedure or when told by your provider. Do not remove the steri-strips or let the shower hit directly on your site. You may wash around your site with soap and water.     Avoid lotions, ointments, or perfumes over your incision until it is well-healed.   You may use a hot tub or a pool AFTER your wound check appointment if the incision is completely closed.   PAcemaker Alerts:  Some alerts are vibratory and others beep. These are NOT emergencies. Please call our office to let us know. If this occurs at night or on weekends, it can wait until the next business day. Send a remote transmission.   If your device is capable of reading fluid status (for heart failure), you will be offered monthly monitoring to review this with you.   DEVICE MANAGEMENT  Remote monitoring is used to monitor your pacemaker from home. This monitoring is scheduled every 91 days by our office. It  allows [place] to keep an eye on the functioning of your device to ensure it is working properly. You will routinely see your Electrophysiologist annually (more often if necessary).    You should receive your ID card for your new device in 4-8 weeks. Keep this card with you at all times once received. Consider wearing a medical alert bracelet or necklace.   Your Pacemaker may be MRI compatible. This will be discussed at your next office visit/wound check.  You should avoid contact with strong electric or magnetic fields.    Do not use amateur (ham) radio equipment or electric (arc) welding torches. MP3 player headphones with magnets should not be used. Some devices are safe to use if held at least 12 inches (30 cm) from your Pacemaker. These include power tools, lawn mowers, and speakers. If you are unsure if something is safe to use, ask your health care provider.   When using your cell phone, hold it to the ear that is on the opposite side from the Pacemaker. Do not leave your cell phone in a pocket over the Pacemaker.   You may safely use electric blankets, heating pads, computers, and microwave ovens.  Call the office right away if:  You have chest pain.  You feel more short of breath than you have felt before.  You feel more light-headed than you have felt before.  Your incision starts to open up.  This information is not intended to replace advice given to you by your health care provider. Make sure you discuss any questions you have with your health care provider. Supplemental Discharge Instructions for  Pacemaker/Defibrillator Patients  Activity Do not raise your left/right arm above shoulder level or extend it backward beyond shoulder level for 2 weeks. Wear the arm sling as a reminder or as needed for comfort for 2 weeks. No heavy lifting or vigorous activity with your left/right arm for 6-8 weeks.    NO DRIVING is preferable for 2 weeks; If absolutely necessary, drive only  short, familiar routes. DO wear your seatbelt, even if it crosses over the pacemaker site.  WOUND CARE - Keep the wound area clean and dry.  Remove the dressing the day after you return home (usually 48 hours after the procedure). - DO NOT SUBMERGE UNDER WATER UNTIL FULLY HEALED (no tub baths, hot tubs, swimming pools, etc.).  - You  may shower or take a sponge bath after the dressing is removed. DO NOT SOAK the area and do not allow the shower to directly spray on the site. - If you have staples, these will be removed in the office in 7-14 days. - If you have tape/steri-strips on your wound, these will fall off; do not pull them off prematurely.   - No bandage is needed on the site.  DO  NOT apply any creams, oils, or ointments to the wound area. - If you notice any drainage or discharge from the wound, any swelling, excessive redness or bruising at the site, or if you develop a fever > 101? F after you are discharged home, call the office at once.  Special Instructions - You are still able to use cellular telephones.  Avoid carrying your cellular phone near your device. - When traveling through airports, show security personnel your identification card to avoid being screened in the metal detectors.  - Avoid arc welding equipment, MRI testing (magnetic resonance imaging), TENS units (transcutaneous nerve stimulators).  Call the office for questions about other devices. - Avoid electrical appliances that are in poor condition or are not properly grounded. - Microwave ovens are safe to be near or to operate.  Additional information for defibrillator patients should your device go off: - If your device goes off ONCE and you feel fine afterward, notify the clinic at [phone]. - If your device goes off ONCE and you do not feel well afterward, call 911. - If your device goes off TWICE or more in one day, call 911.  DO NOT DRIVE YOURSELF OR A FAMILY MEMBER WITH A DEFIBRILLATOR TO THE  HOSPITAL--CALL 911.

## 2020-02-11 ENCOUNTER — Telehealth: Payer: Self-pay | Admitting: *Deleted

## 2020-02-11 MED FILL — Lidocaine HCl Local Inj 1%: INTRAMUSCULAR | Qty: 80 | Status: AC

## 2020-02-11 NOTE — Telephone Encounter (Signed)
-----   Message from Shirley Friar, PA-C sent at 02/10/2020  3:26 PM EDT -----  Same day device.  WC 02/10/20

## 2020-02-11 NOTE — Telephone Encounter (Signed)
Spoke with patient to f/u s/p PPM implant on 02/10/20. Pt reports his site is sore but he denies any increasing swelling, drainage, redness, fever, or chills. Reviewed discharge instructions, wound care, and activity restrictions. Pt aware to remove occlusive dressing but leave steri-strips in place; reports he plans to do so later today. Advised to call for any concerns regarding PPM site prior to wound check appointment. Pt verbalizes understanding.  Pt reports that he is unable to send a Merlin transmission right now. Requests a call back for assistance mid-morning tomorrow. Denies additional questions or concerns at this time. Message routed to DC pool for assistance with sending a transmission tomorrow.

## 2020-02-12 NOTE — Telephone Encounter (Signed)
Transmission received.

## 2020-02-12 NOTE — Telephone Encounter (Signed)
Transmission received and reviewed.  1 AMS episode lasting 8 seconds.

## 2020-02-20 ENCOUNTER — Other Ambulatory Visit: Payer: Self-pay

## 2020-02-20 ENCOUNTER — Ambulatory Visit (INDEPENDENT_AMBULATORY_CARE_PROVIDER_SITE_OTHER): Payer: Medicare Other | Admitting: Emergency Medicine

## 2020-02-20 DIAGNOSIS — I420 Dilated cardiomyopathy: Secondary | ICD-10-CM

## 2020-03-11 NOTE — Progress Notes (Signed)
CRT-P wound check appointment. Steri-strips removed. Wound without redness or edema. Incision edges approximated, wound well healed. Normal device function. Thresholds, sensing, and impedances consistent with implant measurements. Device programmed at 3.5V/auto capture programmed on for extra safety margin until 3 month visit. Histogram distribution appropriate for patient and level of activity. BP % 99%. AT/AF burden < 1%, 1 AMS episode that appears to be AT. No high ventricular rates noted. Patient educated about wound care, arm mobility, lifting restrictions. ROV with Dr [name] 05/19/20 . Enrolled in remote follow up and next remote scheduled  for 05/13/20 and every 3 months there after.

## 2020-03-17 ENCOUNTER — Telehealth: Payer: Self-pay | Admitting: Cardiology

## 2020-03-17 NOTE — Telephone Encounter (Signed)
1. What dental office are you calling from? Dr. Joelene [facility]   2. What is your office phone number? [phone]  3. What is your fax number? [phone]  4. What type of procedure is the patient having performed? Teeth cleaning with Ultrasonic Teeth Cleaning   5. What date is procedure scheduled or is the patient there now?  (if the patient is at the dentist's office question goes to their cardiologist if he/she is in the office.  If not, question should go to the DOD). Patient was at office at time of call, but Kem in the device clinic advised the pt can not have ultrasonic performed before 03/23/20.   6. What is your question (ex. Antibiotics prior to procedure, holding medication-we need to know how long dentist wants pt to hold med)? If any medications need to be held or if pre meds need to be given.

## 2020-03-17 NOTE — Telephone Encounter (Signed)
   Primary Cardiologist: [name], MD  Chart reviewed as part of pre-operative protocol coverage.   Discussed with Dr. [name]. Given recent CRT-P placement, use of ultrasonic cleaning should be delayed until after 03/23/20.  Otherwise, simple dental extractions are considered low risk procedures per guidelines and generally do not require any specific cardiac clearance. It is also generally accepted that for simple extractions and dental cleanings, there is no need to interrupt blood thinner therapy.  SBE prophylaxis is not required for the patient.  I will route this recommendation to the requesting party via Epic fax function and remove from pre-op pool.  Please call with questions.  Abigail Butts, PA-C 03/17/2020, 11:43 AM

## 2020-03-25 DIAGNOSIS — Z961 Presence of intraocular lens: Secondary | ICD-10-CM | POA: Diagnosis not present

## 2020-03-25 DIAGNOSIS — H5022 Vertical strabismus, left eye: Secondary | ICD-10-CM | POA: Diagnosis not present

## 2020-04-13 ENCOUNTER — Other Ambulatory Visit: Payer: Self-pay

## 2020-04-13 ENCOUNTER — Encounter: Payer: Self-pay | Admitting: Cardiology

## 2020-04-13 ENCOUNTER — Ambulatory Visit (INDEPENDENT_AMBULATORY_CARE_PROVIDER_SITE_OTHER): Payer: Medicare Other | Admitting: Cardiology

## 2020-04-13 VITALS — BP 128/78 | HR 79 | Ht 67.0 in | Wt 198.2 lb

## 2020-04-13 DIAGNOSIS — I951 Orthostatic hypotension: Secondary | ICD-10-CM

## 2020-04-13 DIAGNOSIS — Z9861 Coronary angioplasty status: Secondary | ICD-10-CM | POA: Diagnosis not present

## 2020-04-13 DIAGNOSIS — I493 Ventricular premature depolarization: Secondary | ICD-10-CM | POA: Diagnosis not present

## 2020-04-13 DIAGNOSIS — I10 Essential (primary) hypertension: Secondary | ICD-10-CM | POA: Diagnosis not present

## 2020-04-13 DIAGNOSIS — I2510 Atherosclerotic heart disease of native coronary artery without angina pectoris: Secondary | ICD-10-CM

## 2020-04-13 DIAGNOSIS — I447 Left bundle-branch block, unspecified: Secondary | ICD-10-CM

## 2020-04-13 DIAGNOSIS — I5042 Chronic combined systolic (congestive) and diastolic (congestive) heart failure: Secondary | ICD-10-CM | POA: Diagnosis not present

## 2020-04-13 DIAGNOSIS — I420 Dilated cardiomyopathy: Secondary | ICD-10-CM

## 2020-04-13 MED ORDER — BISOPROLOL FUMARATE 5 MG PO TABS: 2.5000 mg | ORAL_TABLET | Freq: Two times a day (BID) | ORAL | 3 refills | Status: DC

## 2020-04-13 NOTE — Patient Instructions (Signed)
Medication Instructions:  Stop taking carvedilol 3.125 mg  Start Bisoprolol 2.5 mg one tablet twice a day   *If you need a refill on your cardiac medications before your next appointment, please call your pharmacy*   Lab Work: Not needed If you have labs (blood work) drawn today and your tests are completely normal, you will receive your results only by: Marland Kitchen MyChart Message (if you have MyChart) OR . A paper copy in the mail If you have any lab test that is abnormal or we need to change your treatment, we will call you to review the results.   Testing/Procedures: Will be schedule at [address] [facility]  [address] - Jan 2022 Your physician has requested that you have an echocardiogram. Echocardiography is a painless test that uses sound waves to create images of your heart. It provides your doctor with information about the size and shape of your heart and how well your heart's chambers and valves are working. This procedure takes approximately one hour. There are no restrictions for this procedure.     Follow-Up: At [facility], you and your health needs are our priority.  As part of our continuing mission to provide you with exceptional heart care, we have created designated Provider Care Teams.  These Care Teams include your primary Cardiologist (physician) and Advanced Practice Providers (APPs -  Physician Assistants and Nurse Practitioners) who all work together to provide you with the care you need, when you need it.  We recommend signing up for the patient portal called "MyChart".  Sign up information is provided on this After Visit Summary.  MyChart is used to connect with patients for Virtual Visits (Telemedicine).  Patients are able to view lab/test results, encounter notes, upcoming appointments, etc.  Non-urgent messages can be sent to your provider as well.   To learn more about what you can do with MyChart, go to [email or web address].    Your next appointment:   5  month(s) Jan 2022  The format for your next appointment:   In Person  Provider:   [name], MD   Other Instructions

## 2020-04-13 NOTE — Progress Notes (Signed)
Primary Care Provider: [name], MD Cardiologist: [name], MD Electrophysiologist: [name], MD  Clinic Note: Chief Complaint  Patient presents with  . Follow-up    After CRT-P.  Multiple questions  . Fatigue    Still has low energy level  . Hypertension    Blood pressures are going up higher, has questions of medications    HPI:    [name] is a 79 y.o. 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   CAD->CABG 07/2004 (Sx = fatigue &PVCs) -->CATH -> MV CAD --> CABG X 4 (RIMA-LAD, LIMA-Diag, SVG-OM, SVG-RCA) ? 04/2011- recurrent Sx ->Myoview + for Inf Ischemia -->Cath =>Only RIMA-LAD patent -->DES PCI of Native Cx-OM (2 overlapping Promus DES) & Native RCA ? June 2016 - DES PCI of the dRCA-RPAV. Since then he has done very well with no active anginal symptoms. He has developed complete LEFT BUNDLE BRANCH BLOCK and subsequently COMBINED ISCHEMIC/NONISCHEMIC CARDIOMYOPATHY  Cardiomyopathy: April 2019 ECHO = EF of roughly 35%-noted new LBBB diagnosis. ? Relook [id number] -patent stents &RIMA-LAD.EF estimated 35 to 45%. Normal RHC pressures.Marland Kitchen PCWP 12 mmHg. LV EDP 14 mmHg ? Most recent Echo October 2020: EF 30-35%. ? Referred to EP (Dr. [name]) for ? BiV ICD, deferred b/c need for  "optimal medical management "-has not been tolerant of just on any medication. ? He managed to take low dose carvedilol and Entresto x 3 months --> EF still ~30% 11/3019 --> EP decided on CRT-P (not likely CRT-D given age)  ? CRT-P 02/10/2020 [facility] Quadra Allure NP  [name] was last seen by me on fifth 2021 after his follow-up echocardiogram.  He was doing his best to render the carvedilol and Entresto, but noted  extreme fatigue even getting out of the chair.  Unable to do anything for more than 5 minutes at a time.  We actually had reduced Entresto to 1/2 tablet twice daily.  No chest tightness or pressure with minimal PVCs.  Was having some coughing/wheezing symptoms (concern for "cardiac asthma ").  Recent Hospitalizations:   February 10, 2020: BiV pacemaker insertion (CRT-P): [facility] NP  Reviewed  CV studies:    The following studies were reviewed today: (if available, images/films reviewed: From Epic Chart or Care Everywhere) . 02/09/2020: [facility] CRT-P  Interval History:   [name] actually for his first visit after his pacemaker placement and he has multiple questions.  He is happy that the procedure is finally done and has promptly stop taking Delene Loll stating that he was having horrible itching and extreme fatigue and dizziness.  He also asked if he could potentially convert back from carvedilol to [facility] which she had done relatively well on before.  He states that he has been taking his Lasix about 3 to 4 days a week and the swelling, dyspnea is pretty well controlled.  He said his PVCs and are much almost gone now.  His questions were all asked and answered: I confirmed that we did see a difference on the EKG to some extent showing a paced QRS rhythm with slightly narrow QRS complexes.  He indicated his BP has actually started going higher (higher than he has had in a long time) with pressures running in the SBP 150-160 mmHg range.--This  was probably partly because of stopping Entresto but also could be from increased cardiac output  He had some questions about diaphragmatic pacing that were answered.  Reading the report, he was not sure if everything was completed.  He asked if he could safely walk. ->  Answers yes  He asked when EF will be rechecked--> roughly 3 months, and that EF is not everything looking for here were looking for quality of life is well is improved  EF.  Does note that his energy level is about the same as before, but he feels less short of breath.  He asked when he should expect energy to recover -> hard to tell  Also asked about antibiotic prophylaxis. ->  My suggestion was to discuss with EP, but not likely for routine dental work.  He has not really gotten back to doing too much, but is feeling that he is able to do more than he was but just still less energy.  He was taken aback by his blood pressure being higher, but impressed that his PVCs are almost gone.  CV Review of Symptoms (Summary) Cardiovascular ROS: positive for - dyspnea on exertion, edema and Fatigue more than shortness of breath.;  Edema is pretty well controlled with Lasix negative for - irregular heartbeat, orthopnea, palpitations, paroxysmal nocturnal dyspnea, rapid heart rate, shortness of breath or Lightheadedness, dizziness or wooziness.  Syncope/near syncope, TIA/amaurosis fugax.  Claudication  The patient does not have symptoms concerning for COVID-19 infection (fever, chills, cough, or new shortness of breath).  The patient is practicing social distancing & Masking.  Immunization History  Administered Date(s) Administered  . Influenza Whole 06/19/2010, 06/06/2011, 06/08/2012  . Pneumococcal Polysaccharide-23 06/07/2007    REVIEWED OF SYSTEMS   Review of Systems  Constitutional: Positive for malaise/fatigue. Negative for weight loss (He has gained quite a bit of weight).  HENT: Negative for nosebleeds.   Respiratory: Positive for cough (Mild). Negative for shortness of breath.   Gastrointestinal: Negative for blood in stool and melena.  Genitourinary: Negative for hematuria.  Musculoskeletal: Negative for joint pain.  Neurological: Negative for dizziness and weakness.  Psychiatric/Behavioral: Negative for depression and memory loss. The patient is not nervous/anxious and does not have insomnia.    I have reviewed and (if needed) personally updated the  patient's problem list, medications, allergies, past medical and surgical history, social and family history.   PAST MEDICAL HISTORY   Past Medical History:  Diagnosis Date  . Allergic rhinitis   . Arthritis    "entire body" (02/19/2015)  . Asthma   . Asthmatic bronchitis   . Biventricular cardiac pacemaker in situ 02/10/2020   [facility] (Dr. [name])  . CAD of autologous vein bypass graft without angina 04/2011   Frequent PVCs & fatigue -> MYOVIEW w/ inferior /r MV distribution --> CATH: only RIMA-LAD patent (LIMA atretic & SVGs occluded) --> Staged PCI of Native RCA & Cx; followed by PCI-distal RCA  . CAD S/P percutaneous coronary angioplasty 8/'12; 06/'16   a) PCI- Cx-OM W/ 2 overlapping Resolute DES 3x15 & 3x18 stents (3.41mm),  PCI-RCA -  Promus DES 3x38 (3.12mm);; b) 6/'16: PCI-dRCA-rPAV Promus DES 2.75X28 (3.0 mm) -- stents patent in 01/2018;  01/2018 -stents remain patent  . CAS (cerebral atherosclerosis)    CAROTID DOPPLER, 08/25/2011 - mildly abnormal  . Chronic back pain   . Dilated cardiomyopathy ([place]): EF ~30-35%, 01/05/2018   11/2019 - EF ~30%, global HK with Septal dyssynergy --> 6/6/'21 - s/p  CRT-P ([facility] Jude)  . Diverticulosis   . Erectile dysfunction   . Essential hypertension    Medication intolerant -- only able to take minimal doses of medications  . Family hx of colon cancer   . GERD (gastroesophageal reflux disease)   . Gout   . Hemorrhoids   . Hyperlipidemia with target LDL less than 70    Only able to tolerate very low dose statin - no interest in other options  . Kidney stones   . Myocardial infarction ([place]) 2005  . Polycythemia   . 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 Symptomatic PVCs    As the main symptom for angina  . Syncope 2010   ? unclear of etiology    PAST SURGICAL HISTORY   Past Surgical History:  Procedure Laterality Date  .  BIV PACEMAKER INSERTION CRT-P N/A 02/10/2020   Procedure: BIV PACEMAKER INSERTION CRT-P;  Surgeon: Constance Haw, MD;  Location: [place] [facility];  Service: Cardiovascular;  Laterality: N/A;  . CARDIAC CATHETERIZATION  07/15/2004   CABG recommended; continue medical therapy  . CARDIAC CATHETERIZATION  04/26/2011   Patent RIMA-LAD, atretic LIMA-Dx (previous) 100 and CTO SVG to Cx and SVG to PDA. LAD 80-90% eccentric stenosis at SP1 that has 70% stenosis. Cx: Large OM and another distal branch with 90-95% stenosis followed by lesion in the OM branch. RCA dominant 60-80% lesion proximally w/ several old lesions. PDA occluded 70-80% annular lesion in the proximal RCA. 3 of 4 grafts CTO RIMA-LAD patent  . CARDIAC CATHETERIZATION N/A 02/20/2015   Procedure: Left Heart Cath and Coronary Angiography;  Surgeon: Leonie Man, MD;  Location: [place] [facility]: Widely patent p-mRCA DES with dRCA 50%-PAVG 80% --> PCI. Patent overlapping stents --mCx-OM. 99% pLAD - patent RIMA-dLAD.  Known CTO SVG-rPL, SVG-OM3, :LIMA-D1.  Marland Kitchen CATARACT EXTRACTION W/ INTRAOCULAR LENS  IMPLANT, BILATERAL Bilateral ~ 2006/2007  . CORONARY ANGIOPLASTY WITH STENT PLACEMENT  04/28/2011; 02/20/2015   PCI-Cx-OM 3 overlapping Resolute DES 3 mm x19mm and 3x56mm --> 3.40mm; PCI to RCA - Promus Element DES 3x55mm --> 3.79mm;; b) dRCA-rPAV: Promus Premier DES 2.51mm X 28 mm (3.0 mm)   . CORONARY ARTERY BYPASS GRAFT  07/23/2004   LIMA-Cx RIMA-LAD, SVG-diagonal, SVG-PDA  . RIGHT/LEFT HEART CATH AND CORONARY/GRAFT ANGIOGRAPHY N/A 01/09/2018   Procedure: RIGHT/LEFT HEART CATH AND CORONARY/GRAFT ANGIOGRAPHY;  Surgeon: Leonie Man, MD;; Known occluded LIMA-Diag & SVG-RCA, SVG-OM.  Mod-severe pLAD that is grafted after D1 - patent RIMA- wraparound LAD, distal 1/2 of PDA territory. Patent Cx-OM DES (ost Cx mild) & p-mRCA overlapping DES & dRCA-RPAV DES.  PCWP 12 mmHg, LVP-EDP: 140/6 - 14 mmH. RAP 2 mmHg. RVP-EDP 33/0 - 5  mmHg  . TRANSTHORACIC  ECHOCARDIOGRAM  02/18/2015    Mild concentric LVH, EF 55-60%, no RWMA, Gr 1 DD, aortic sclerosis without stenosis  . TRANSTHORACIC ECHOCARDIOGRAM  12/2017; 06/2019   (New LBBB)  EF~35% with diffuse HK of septal & lateral walls.  Mildly dilated LV.  "GR 1 "DD with high filling pressures. ? w/ normal LA size; 10/20: EF 30-35%, LBBB related Septal dyskinesis, GR2 DD (but normal LA size).   . TRANSTHORACIC ECHOCARDIOGRAM  11/27/2019   (On optimal/max tolerated dose of carvedilol and Entresto): EF estimated 30% with moderate to severe decreased function and global hypokinesis.  Significant septal-lateral dyssynchrony from LBBB.  Mild LV dilation.  At least GR 1 DD.  Mildly reduced RV function but normal pressures.  Mild aortic sclerosis but no significant regurgitation.  . TRANSURETHRAL RESECTION OF PROSTATE  ~ 2002/2003     RLCP 01/2018: LIMA-Diag, SVG-RCA, SVG-OM known CTO. ? Patent RIMA-LAD (severe native LAD disease prior to D1 &D2 both with extensive disease) - retrograde fills to CTO &antegrade wrap-around LAD (distal 1/2 of PDA).  ? DES in LCx-OM &p-m RCA &dRCA-RPAV widely patent with patent RPDA.  ? Normal RHC #s: PCWP / LVEDP ~12-14 mmHg. RAP &RVEDP ~0-2 mmHg.     MEDICATIONS/ALLERGIES   Current Meds  Medication Sig  . ALPRAZolam (XANAX) 0.5 MG tablet Take 0.5 mg by mouth 2 (two) times daily as needed for anxiety.   Marland Kitchen aspirin EC 81 MG tablet Take 81 mg by mouth daily.   Marland Kitchen BREO ELLIPTA 100-25 MCG/INH AEPB Inhale 1 puff into the lungs daily.  . chlorzoxazone (PARAFON) 500 MG tablet Take 500 mg by mouth daily as needed for muscle spasms.  . furosemide (LASIX) 20 MG tablet Take 1 tablet (20 mg total) by mouth as needed for edema.  Marland Kitchen nystatin cream (MYCOSTATIN) Apply 1 application topically daily as needed (rash).   . nystatin-triamcinolone (MYCOLOG II) cream Apply 1 application topically daily as needed (yeast infections).  Vladimir Faster Glycol-Propyl Glycol (SYSTANE OP) Place 1 drop  into both eyes 2 (two) times daily.  . rosuvastatin (CRESTOR) 5 MG tablet Take 5 mg by mouth every evening.   . triamcinolone cream (KENALOG) 0.1 % Apply 1 application topically daily as needed (irritation).   . [DISCONTINUED] carvedilol (COREG) 3.125 MG tablet Take 1 tablet (3.125 mg total) by mouth 2 (two) times daily with a meal.    Allergies  Allergen Reactions  . Meperidine Hcl     Cold sweats, dizziness   . Penicillins     Severe headaches Has patient had a PCN reaction causing immediate rash, facial/tongue/throat swelling, SOB or lightheadedness with hypotension: No Has patient had a PCN reaction causing severe rash involving mucus membranes or skin necrosis: No Has patient had a PCN reaction that required hospitalization: No Has patient had a PCN reaction occurring within the last 10 years: No If all of the above answers are "NO", then may proceed with Cephalosporin use.     SOCIAL HISTORY/FAMILY HISTORY   Reviewed in Epic:  Pertinent findings: N/A  OBJCTIVE -PE, EKG, labs   Wt Readings from Last 3 Encounters:  04/13/20 198 lb 3.2 oz (89.9 kg)  02/10/20 186 lb (84.4 kg)  12/30/19 192 lb (87.1 kg)    Physical Exam: BP 128/78   Pulse 79   Ht 5\' 7"  (1.702 m)   Wt 198 lb 3.2 oz (89.9 kg)   SpO2 95%   BMI 31.04 kg/m  Physical Exam Vitals reviewed.  Constitutional:      General: He is not in acute distress.    Appearance: Normal appearance. He is not ill-appearing.     Comments: Borderline Obese; well-groomed.  Otherwise healthy-appearing  HENT:     Head: Normocephalic and atraumatic.  Neck:     Vascular: No carotid bruit, hepatojugular reflux or JVD.  Cardiovascular:     Rate and Rhythm: Normal rate and regular rhythm.  No extrasystoles are present.    Chest Wall: PMI is displaced (Lateral, nonsustained).     Heart sounds: S1 normal and S2 normal. Murmur heard.  Medium-pitched harsh crescendo-decrescendo early systolic murmur is present with a grade of 1/6 at  the  upper right sternal border.  Gallop present. S4 sounds present.   Pulmonary:     Effort: Pulmonary effort is normal. No respiratory distress.     Breath sounds: Normal breath sounds.  Musculoskeletal:        General: Swelling (Trivial bilateral LE) present. Normal range of motion.     Cervical back: Normal range of motion.  Neurological:     General: No focal deficit present.     Mental Status: He is alert and oriented to person, place, and time.  Psychiatric:        Mood and Affect: Mood normal.        Behavior: Behavior normal.        Thought Content: Thought content normal.        Judgment: Judgment normal.     Comments: Seems to be in pretty good spirits     Adult ECG Report  Rate: 79 ;  Rhythm: normal sinus rhythm and BiV pacing (atrial sensing);   Narrative Interpretation: Normal functioning BiV pacer  Recent Labs: April 03, 2019: TC 218, TG 144, HDL 47, LDL 142.  A1c 6.2.  Lab Results  Component Value Date   CREATININE 0.86 01/30/2020   BUN 12 01/30/2020   NA 140 01/30/2020   K 5.1 01/30/2020   CL 102 01/30/2020   CO2 25 01/30/2020   No results found for: TSH  ASSESSMENT/PLAN    Problem List Items Addressed This Visit    CAD S/P  PCI of Cx-OM w/ 2 Resolute DES 3.0 x 15 & 3.0 x 18 (3.86mm); PCI-pRCA: Promus Element DES 3.0 x 38 (3.9mm); dRCA-rPAV: Promus Premier DES 2.75x28 (3 mm) (Chronic)    1 out of 4 grafts still remaining (RIMA-LAD) with patent stents throughout the RCA-RPL (small PDA essentially occluded but large wraparound LAD) as well as stents in the proximal and mid LCx.  No obvious reason for reduced EF based on these findings.  His primary anginal equivalent would be PVCs and those seem to have improved with CRT-P.  Plan  He is really having hard time with carvedilol, plan will be to switch back to bisoprolol 2.5 mg daily  No longer on Plavix because of bleeding issues.  Is on low-dose aspirin.  Tolerating low-dose rosuvastatin and doing okay.  -->  Will consider Orion trial or other options for lipid management once he feels that he is recovered from CRT-P      Relevant Medications   bisoprolol (ZEBETA) 5 MG tablet   Other Relevant Orders   EKG 12-Lead (Completed)   ECHOCARDIOGRAM COMPLETE   Complete left bundle branch block (LBBB) (Chronic)    I think the LBBB had a lot to do with his drop in EF as it seemed to coincide with his precipitous drop.  Was not able to see any true ischemic cause.  Hopefully with CRT-P, we will get back some of the septal forces and improve EF.      Relevant Medications   bisoprolol (ZEBETA) 5 MG tablet   Other Relevant Orders   EKG 12-Lead (Completed)   ECHOCARDIOGRAM COMPLETE   Dilated cardiomyopathy ([place]): EF ~35%, new LBBB - no change to Coronary Anatony on Cath (Chronic)    Most notable wall motion normality on echo besides global hypokinesis was a septal dyssynchrony from LBBB.  Hopefully this will improve with CRT-P.  I reviewed his cath films and there are septal perforators and small diagonal branches in the proximal portion of the LAD that could be ischemic,  however the proximal LAD is a very small caliber vessel now after long-term occlusion.  I am not sure how much he would benefit from attempted revascularization, and he is not all that excited about the idea of being on Plavix again.   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  He has a lot of fatigue but is relatively euvolemic with minimal dosing of furosemide.  Biggest issue is that he is just intolerant of despite any medication we tried on him.  Did not tolerate carvedilol or Entresto, but now his pressures are higher.  He is now taking Lasix 3 to  4 days a week most weeks. Did not tolerate Entresto, so he is stopped taking it. Converting from carvedilol to bisoprolol which he tolerates better.  When I see him back in follow-up, we will discuss Jardiance or Wilder Glade as an option for mild diuresis. We may also try to reinitiate an ARB my see him back.  Recheck 2D echo in about January to allow for time to have the pacemaker kick in fully.       Relevant Medications   bisoprolol (ZEBETA) 5 MG tablet   Other Relevant Orders   EKG 12-Lead (Completed)   ECHOCARDIOGRAM COMPLETE   Essential hypertension (Chronic)    His blood pressures are now starting to get little higher, but regardless he still has not been able to tolerate beta-blockers.  We will switch him back from carvedilol to bisoprolol and follow-up for little while.  Hopefully we may build to get him back on a low-dose of ARB for some afterload reduction.      Relevant Medications   bisoprolol (ZEBETA) 5 MG tablet   PVC's (premature ventricular contractions) (Chronic)   Relevant Medications   bisoprolol (ZEBETA) 5 MG tablet   Other Relevant Orders   EKG 12-Lead (Completed)   ECHOCARDIOGRAM COMPLETE   Orthostatic hypotension - with tremor    Really having a hard time of carvedilol Entresto.  We reduced the doses but he still could not tolerate it.  Now that he has had his pacemaker placed, he decided to stop taking Entresto and has not had less dizziness.  Interestingly, his pressures are much higher.      Relevant Medications   bisoprolol (ZEBETA) 5 MG tablet      COVID-19 Education: The signs and symptoms of COVID-19 were discussed with the patient and how to seek care for testing (follow up with PCP or arrange E-visit).   The importance of social distancing and COVID-19 vaccination was discussed today.  I spent a total of 26 minutes with the patient. >  50% of the time was spent in direct patient consultation.  Additional time spent with chart review  /  charting (studies, outside notes, etc): 20 Total Time: 78min   Current medicines are reviewed at length with the patient today.  (+/- concerns) asked about converting back to Bisoprolol from Carvedilol -- will change.   Notice: This dictation was prepared with Dragon dictation along with smaller phrase technology. Any transcriptional errors that result from this process are unintentional and may not be corrected upon review.  Patient Instructions / Medication Changes & Studies & Tests Ordered  Patient Instructions  Medication Instructions:  Stop taking carvedilol 3.125 mg  Start Bisoprolol 2.5 mg one tablet twice a day   *If you need a refill on your cardiac medications before your next appointment, please call your pharmacy*   Lab Work: Not needed If you have labs (blood work) drawn today and your tests are completely normal, you will receive your results only by: Marland Kitchen MyChart Message (if you have MyChart) OR . A paper copy in the mail If you have any lab test that is abnormal or we need to change your treatment, we will call you to review the results.   Testing/Procedures: Will be schedule at [address] - Jan 2022 Your physician has requested that you have an echocardiogram. Echocardiography is a painless test that uses sound waves to create images of your heart. It provides your doctor with information about the size and shape of your heart and how well your heart's chambers and valves are working. This procedure takes approximately one hour. There are no restrictions for this procedure.     Follow-Up: At [facility], you and your health needs are our priority.  As part of our continuing mission to provide you with exceptional heart care, we have created designated Provider Care Teams.  These Care Teams include your primary Cardiologist (physician) and Advanced Practice Providers (APPs -  Physician Assistants and Nurse Practitioners) who all work  together to provide you with the care you need, when you need it.  We recommend signing up for the patient portal called "MyChart".  Sign up information is provided on this After Visit Summary.  MyChart is used to connect with patients for Virtual Visits (Telemedicine).  Patients are able to view lab/test results, encounter notes, upcoming appointments, etc.  Non-urgent messages can be sent to your provider as well.   To learn more about what you can do with MyChart, go to [email or web address].    Your next appointment:   5 month(s) Jan 2022  The format for your next appointment:   In Person  Provider:   [name], MD   Other Instructions    Studies Ordered:   Orders Placed This Encounter  Procedures  . EKG 12-Lead  . ECHOCARDIOGRAM COMPLETE     [name], M.D., M.S. Interventional Cardiologist   Pager # [phone] Phone # [phone] [address] [place],  [address]   Thank you for choosing Heartcare at [facility]!!

## 2020-04-20 DIAGNOSIS — J4541 Moderate persistent asthma with (acute) exacerbation: Secondary | ICD-10-CM | POA: Diagnosis not present

## 2020-04-21 ENCOUNTER — Encounter: Payer: Self-pay | Admitting: Cardiology

## 2020-04-21 NOTE — Assessment & Plan Note (Signed)
Really having a hard time of carvedilol Entresto.  We reduced the doses but he still could not tolerate it.  Now that he has had his pacemaker placed, he decided to stop taking Entresto and has not had less dizziness.  Interestingly, his pressures are much higher.

## 2020-04-21 NOTE — Assessment & Plan Note (Signed)
I think the LBBB had a lot to do with his drop in EF as it seemed to coincide with his precipitous drop.  Was not able to see any true ischemic cause.  Hopefully with CRT-P, we will get back some of the septal forces and improve EF.

## 2020-04-21 NOTE — Assessment & Plan Note (Addendum)
Probably class II symptoms.  He has a lot of fatigue but is relatively euvolemic with minimal dosing of furosemide.  Biggest issue is that he is just intolerant of despite any medication we tried on him.  Did not tolerate carvedilol or Entresto, but now his pressures are higher.  He is now taking Lasix 3 to 4 days a week most weeks. Did not tolerate Entresto, so he is stopped taking it. Converting from carvedilol to bisoprolol which he tolerates better.  When I see him back in follow-up, we will discuss Jardiance or Wilder Glade as an option for mild diuresis. We may also try to reinitiate an ARB my see him back.  Recheck 2D echo in about January to allow for time to have the pacemaker kick in fully.

## 2020-04-21 NOTE — Assessment & Plan Note (Signed)
Most notable wall motion normality on echo besides global hypokinesis was a septal dyssynchrony from LBBB.  Hopefully this will improve with CRT-P.  I reviewed his cath films and there are septal perforators and small diagonal branches in the proximal portion of the LAD that could be ischemic, however the proximal LAD is a very small caliber vessel now after long-term occlusion.  I am not sure how much he would benefit from attempted revascularization, and he is not all that excited about the idea of being on Plavix again.   We will plan to recheck echocardiogram to reevaluate EF 6 months post CRT-P

## 2020-04-21 NOTE — Assessment & Plan Note (Signed)
1 out of 4 grafts still remaining (RIMA-LAD) with patent stents throughout the RCA-RPL (small PDA essentially occluded but large wraparound LAD) as well as stents in the proximal and mid LCx.  No obvious reason for reduced EF based on these findings.  His primary anginal equivalent would be PVCs and those seem to have improved with CRT-P.  Plan  He is really having hard time with carvedilol, plan will be to switch back to bisoprolol 2.5 mg daily  No longer on Plavix because of bleeding issues.  Is on low-dose aspirin.  Tolerating low-dose rosuvastatin and doing okay. -->  Will consider [place]ion trial or other options for lipid management once he feels that he is recovered from CRT-P

## 2020-04-21 NOTE — Assessment & Plan Note (Signed)
His blood pressures are now starting to get little higher, but regardless he still has not been able to tolerate beta-blockers.  We will switch him back from carvedilol to bisoprolol and follow-up for little while.  Hopefully we may build to get him back on a low-dose of ARB for some afterload reduction.

## 2020-05-07 DIAGNOSIS — F419 Anxiety disorder, unspecified: Secondary | ICD-10-CM | POA: Diagnosis not present

## 2020-05-07 DIAGNOSIS — I259 Chronic ischemic heart disease, unspecified: Secondary | ICD-10-CM | POA: Diagnosis not present

## 2020-05-07 DIAGNOSIS — K219 Gastro-esophageal reflux disease without esophagitis: Secondary | ICD-10-CM | POA: Diagnosis not present

## 2020-05-07 DIAGNOSIS — R972 Elevated prostate specific antigen [PSA]: Secondary | ICD-10-CM | POA: Diagnosis not present

## 2020-05-07 DIAGNOSIS — M545 Low back pain: Secondary | ICD-10-CM | POA: Diagnosis not present

## 2020-05-07 DIAGNOSIS — G250 Essential tremor: Secondary | ICD-10-CM | POA: Diagnosis not present

## 2020-05-07 DIAGNOSIS — R799 Abnormal finding of blood chemistry, unspecified: Secondary | ICD-10-CM | POA: Diagnosis not present

## 2020-05-07 DIAGNOSIS — Z6829 Body mass index (BMI) 29.0-29.9, adult: Secondary | ICD-10-CM | POA: Diagnosis not present

## 2020-05-07 DIAGNOSIS — I2510 Atherosclerotic heart disease of native coronary artery without angina pectoris: Secondary | ICD-10-CM | POA: Diagnosis not present

## 2020-05-07 DIAGNOSIS — N400 Enlarged prostate without lower urinary tract symptoms: Secondary | ICD-10-CM | POA: Diagnosis not present

## 2020-05-07 DIAGNOSIS — E291 Testicular hypofunction: Secondary | ICD-10-CM | POA: Diagnosis not present

## 2020-05-07 DIAGNOSIS — K635 Polyp of colon: Secondary | ICD-10-CM | POA: Diagnosis not present

## 2020-05-13 ENCOUNTER — Ambulatory Visit (INDEPENDENT_AMBULATORY_CARE_PROVIDER_SITE_OTHER): Payer: Medicare Other | Admitting: *Deleted

## 2020-05-13 DIAGNOSIS — I2510 Atherosclerotic heart disease of native coronary artery without angina pectoris: Secondary | ICD-10-CM | POA: Diagnosis not present

## 2020-05-13 DIAGNOSIS — I420 Dilated cardiomyopathy: Secondary | ICD-10-CM | POA: Diagnosis not present

## 2020-05-13 DIAGNOSIS — R7301 Impaired fasting glucose: Secondary | ICD-10-CM | POA: Diagnosis not present

## 2020-05-13 DIAGNOSIS — R972 Elevated prostate specific antigen [PSA]: Secondary | ICD-10-CM | POA: Diagnosis not present

## 2020-05-13 DIAGNOSIS — R600 Localized edema: Secondary | ICD-10-CM | POA: Diagnosis not present

## 2020-05-13 DIAGNOSIS — I10 Essential (primary) hypertension: Secondary | ICD-10-CM | POA: Diagnosis not present

## 2020-05-13 DIAGNOSIS — R001 Bradycardia, unspecified: Secondary | ICD-10-CM | POA: Diagnosis not present

## 2020-05-13 DIAGNOSIS — I5042 Chronic combined systolic (congestive) and diastolic (congestive) heart failure: Secondary | ICD-10-CM | POA: Diagnosis not present

## 2020-05-13 DIAGNOSIS — Z0000 Encounter for general adult medical examination without abnormal findings: Secondary | ICD-10-CM | POA: Diagnosis not present

## 2020-05-13 DIAGNOSIS — E782 Mixed hyperlipidemia: Secondary | ICD-10-CM | POA: Diagnosis not present

## 2020-05-13 DIAGNOSIS — G8929 Other chronic pain: Secondary | ICD-10-CM | POA: Diagnosis not present

## 2020-05-13 DIAGNOSIS — Z23 Encounter for immunization: Secondary | ICD-10-CM | POA: Diagnosis not present

## 2020-05-13 DIAGNOSIS — M545 Low back pain: Secondary | ICD-10-CM | POA: Diagnosis not present

## 2020-05-13 LAB — CUP PACEART REMOTE DEVICE CHECK
Battery Remaining Longevity: 50 mo
Battery Remaining Percentage: 95.5 %
Battery Voltage: 2.98 V
Brady Statistic AP VP Percent: 34 %
Brady Statistic AP VS Percent: 1 %
Brady Statistic AS VP Percent: 65 %
Brady Statistic AS VS Percent: 1 %
Brady Statistic RA Percent Paced: 33 %
Date Time Interrogation Session: 20210908030515
Implantable Lead Implant Date: 20210607
Implantable Lead Implant Date: 20210607
Implantable Lead Implant Date: 20210607
Implantable Lead Location: 753858
Implantable Lead Location: 753859
Implantable Lead Location: 753860
Implantable Pulse Generator Implant Date: 20210607
Lead Channel Impedance Value: 440 Ohm
Lead Channel Impedance Value: 680 Ohm
Lead Channel Impedance Value: 700 Ohm
Lead Channel Pacing Threshold Amplitude: 0.5 V
Lead Channel Pacing Threshold Amplitude: 0.75 V
Lead Channel Pacing Threshold Amplitude: 1 V
Lead Channel Pacing Threshold Pulse Width: 0.5 ms
Lead Channel Pacing Threshold Pulse Width: 0.5 ms
Lead Channel Pacing Threshold Pulse Width: 0.5 ms
Lead Channel Sensing Intrinsic Amplitude: 12 mV
Lead Channel Sensing Intrinsic Amplitude: 2.5 mV
Lead Channel Setting Pacing Amplitude: 3.5 V
Lead Channel Setting Pacing Amplitude: 3.5 V
Lead Channel Setting Pacing Amplitude: 3.5 V
Lead Channel Setting Pacing Pulse Width: 0.5 ms
Lead Channel Setting Pacing Pulse Width: 0.5 ms
Lead Channel Setting Sensing Sensitivity: 2 mV
Pulse Gen Model: 3562
Pulse Gen Serial Number: 3818273

## 2020-05-14 NOTE — Progress Notes (Signed)
Remote pacemaker transmission.   

## 2020-05-19 ENCOUNTER — Encounter: Payer: Medicare Other | Admitting: Cardiology

## 2020-05-28 ENCOUNTER — Ambulatory Visit (INDEPENDENT_AMBULATORY_CARE_PROVIDER_SITE_OTHER): Payer: Medicare Other | Admitting: Cardiology

## 2020-05-28 ENCOUNTER — Other Ambulatory Visit: Payer: Self-pay

## 2020-05-28 ENCOUNTER — Encounter: Payer: Self-pay | Admitting: Cardiology

## 2020-05-28 VITALS — BP 138/68 | HR 60 | Ht 67.0 in | Wt 197.4 lb

## 2020-05-28 DIAGNOSIS — I420 Dilated cardiomyopathy: Secondary | ICD-10-CM

## 2020-05-28 DIAGNOSIS — I447 Left bundle-branch block, unspecified: Secondary | ICD-10-CM

## 2020-05-28 DIAGNOSIS — I255 Ischemic cardiomyopathy: Secondary | ICD-10-CM

## 2020-05-28 NOTE — Progress Notes (Signed)
Electrophysiology Office Note   Date:  05/28/2020   ID:  Nissim, Fleischer 14-May-1941, MRN [id number]  PCP:  [name], MD  Cardiologist:  Ellyn Hack Primary Electrophysiologist:  Keavon Sensing Meredith Leeds, MD    Chief Complaint: CHF   History of Present Illness: [name] is a 78 y.o. male who is being seen today for the evaluation of CHF at the request of [name], MD. Presenting today for electrophysiology evaluation.  He has a history of coronary artery disease status post CABG, PVCs, CHF, hypertension, and hyperlipidemia.  He has left bundle branch block and is now status post [place] CRT-D implanted 02/10/2020.    Today, denies symptoms of palpitations, chest pain, shortness of breath, orthopnea, PND, lower extremity edema, claudication, dizziness, presyncope, syncope, bleeding, or neurologic sequela. The patient is tolerating medications without difficulties.  Since his device implanted, he has done well.  He felt continued fatigue for the first month and a half, but over the last few months, he has felt his fatigue greatly improved.  He is comfortable with his device and is excited about his overall functionality.  Past Medical History:  Diagnosis Date  . Allergic rhinitis   . Arthritis    "entire body" (02/19/2015)  . Asthma   . Asthmatic bronchitis   . 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 (Dr. [name])  . CAD of autologous vein bypass graft without angina 04/2011   Frequent PVCs & fatigue -> MYOVIEW w/ inferior /r MV distribution --> CATH: only RIMA-LAD patent (LIMA atretic & SVGs occluded) --> Staged PCI of Native RCA & Cx; followed by PCI-distal RCA  . CAD S/P percutaneous coronary angioplasty 8/'12; 06/'16   a) PCI- Cx-OM W/ 2 overlapping Resolute DES 3x15 & 3x18 stents (3.14mm),  PCI-RCA -  Promus DES 3x38 (3.35mm);; b) 6/'16: PCI-dRCA-rPAV Promus DES 2.75X28 (3.0 mm) -- stents patent in 01/2018;  01/2018 -stents remain patent  .  CAS (cerebral atherosclerosis)    CAROTID DOPPLER, 08/25/2011 - mildly abnormal  . Chronic back pain   . Dilated cardiomyopathy ([place]): EF ~30-35%, 01/05/2018   11/2019 - EF ~30%, global HK with Septal dyssynergy --> 6/6/'21 - s/p CRT-P ([employer])  . Diverticulosis   . Erectile dysfunction   . Essential hypertension    Medication intolerant -- only able to take minimal doses of medications  . Family hx of colon cancer   . GERD (gastroesophageal reflux disease)   . Gout   . Hemorrhoids   . Hyperlipidemia with target LDL less than 70    Only able to tolerate very low dose statin - no interest in other options  . Kidney stones   . Myocardial infarction ([place]) 2005  . Polycythemia   . 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 Symptomatic PVCs    As the main symptom for angina  . Syncope 2010   ? unclear of etiology   Past Surgical History:  Procedure Laterality Date  . BIV PACEMAKER INSERTION CRT-P N/A 02/10/2020   Procedure: BIV PACEMAKER INSERTION CRT-P;  Surgeon: Constance Haw, MD;  Location: [facility];  Service: Cardiovascular;  Laterality: N/A;  . CARDIAC CATHETERIZATION  07/15/2004   CABG recommended; continue medical therapy  . CARDIAC CATHETERIZATION  04/26/2011   Patent RIMA-LAD, atretic LIMA-Dx (previous) 100 and CTO SVG to Cx  and SVG to PDA. LAD 80-90% eccentric stenosis at SP1 that has 70% stenosis. Cx: Large OM and another distal branch with 90-95% stenosis followed by lesion in the OM branch. RCA dominant 60-80% lesion proximally w/ several old lesions. PDA occluded 70-80% annular lesion in the proximal RCA. 3 of 4 grafts CTO RIMA-LAD patent  . CARDIAC CATHETERIZATION N/A 02/20/2015   Procedure: Left Heart Cath and Coronary Angiography;  Surgeon: Leonie Man, MD;  Location: [facility]: Widely patent p-mRCA DES with dRCA 50%-PAVG 80% --> PCI. Patent  overlapping stents --mCx-OM. 99% pLAD - patent RIMA-dLAD.  Known CTO SVG-rPL, SVG-OM3, :LIMA-D1.  Marland Kitchen CATARACT EXTRACTION W/ INTRAOCULAR LENS  IMPLANT, BILATERAL Bilateral ~ 2006/2007  . CORONARY ANGIOPLASTY WITH STENT PLACEMENT  04/28/2011; 02/20/2015   PCI-Cx-OM 3 overlapping Resolute DES 3 mm x63mm and 3x70mm --> 3.79mm; PCI to RCA - Promus Element DES 3x15mm --> 3.60mm;; b) dRCA-rPAV: Promus Premier DES 2.32mm X 28 mm (3.0 mm)   . CORONARY ARTERY BYPASS GRAFT  07/23/2004   LIMA-Cx RIMA-LAD, SVG-diagonal, SVG-PDA  . RIGHT/LEFT HEART CATH AND CORONARY/GRAFT ANGIOGRAPHY N/A 01/09/2018   Procedure: RIGHT/LEFT HEART CATH AND CORONARY/GRAFT ANGIOGRAPHY;  Surgeon: Leonie Man, MD;; Known occluded LIMA-Diag & SVG-RCA, SVG-OM.  Mod-severe pLAD that is grafted after D1 - patent RIMA- wraparound LAD, distal 1/2 of PDA territory. Patent Cx-OM DES (ost Cx mild) & p-mRCA overlapping DES & dRCA-RPAV DES.  PCWP 12 mmHg, LVP-EDP: 140/6 - 14 mmH. RAP 2 mmHg. RVP-EDP 33/0 - 5  mmHg  . TRANSTHORACIC ECHOCARDIOGRAM  02/18/2015    Mild concentric LVH, EF 55-60%, no RWMA, Gr 1 DD, aortic sclerosis without stenosis  . TRANSTHORACIC ECHOCARDIOGRAM  12/2017; 06/2019   (New LBBB)  EF~35% with diffuse HK of septal & lateral walls.  Mildly dilated LV.  "GR 1 "DD with high filling pressures. ? w/ normal LA size; 10/20: EF 30-35%, LBBB related Septal dyskinesis, GR2 DD (but normal LA size).   . TRANSTHORACIC ECHOCARDIOGRAM  11/27/2019   (On optimal/max tolerated dose of carvedilol and Entresto): EF estimated 30% with moderate to severe decreased function and global hypokinesis.  Significant septal-lateral dyssynchrony from LBBB.  Mild LV dilation.  At least GR 1 DD.  Mildly reduced RV function but normal pressures.  Mild aortic sclerosis but no significant regurgitation.  . TRANSURETHRAL RESECTION OF PROSTATE  ~ 2002/2003     Current Outpatient Medications  Medication Sig Dispense Refill  . ALPRAZolam (XANAX) 0.5 MG tablet  Take 0.5 mg by mouth 2 (two) times daily as needed for anxiety.     Marland Kitchen aspirin EC 81 MG tablet Take 81 mg by mouth daily.     . bisoprolol (ZEBETA) 5 MG tablet Take 0.5 tablets (2.5 mg total) by mouth 2 (two) times daily. 90 tablet 3  . BREO ELLIPTA 100-25 MCG/INH AEPB Inhale 1 puff into the lungs daily.    . chlorzoxazone (PARAFON) 500 MG tablet Take 500 mg by mouth daily as needed for muscle spasms.    Marland Kitchen nystatin cream (MYCOSTATIN) Apply 1 application topically daily as needed (rash).     . nystatin-triamcinolone (MYCOLOG II) cream Apply 1 application topically daily as needed (yeast infections).    Vladimir Faster Glycol-Propyl Glycol (SYSTANE OP) Place 1 drop into both eyes 2 (two) times daily.    . rosuvastatin (CRESTOR) 5 MG tablet Take 5 mg by mouth every evening.     . triamcinolone cream (KENALOG) 0.1 % Apply 1 application topically daily as needed (irritation).     Marland Kitchen  furosemide (LASIX) 20 MG tablet Take 1 tablet (20 mg total) by mouth as needed for edema. 90 tablet 0   No current facility-administered medications for this visit.    Allergies:   Meperidine hcl and Penicillins   Social History:  The patient  reports that he quit smoking about 56 years ago. His smoking use included cigarettes. He has a 7.00 pack-year smoking history. He has never used smokeless tobacco. He reports that he does not drink alcohol and does not use drugs.   Family History:  The patient's family history includes Colon cancer in his brother; Heart disease in his brother; Heart disease (age of onset: 18) in his father; Hypertension (age of onset: 60) in his brother; Hypertension (age of onset: 2) in his mother; Stomach cancer in his sister; Stroke (age of onset: 83) in his mother.    ROS:  Please see the history of present illness.   Otherwise, review of systems is positive for none.   All other systems are reviewed and negative.   PHYSICAL EXAM: VS:  BP 138/68   Pulse 60   Ht 5\' 7"  (1.702 m)   Wt 197 lb 6.4  oz (89.5 kg)   SpO2 95%   BMI 30.92 kg/m  , BMI Body mass index is 30.92 kg/m. GEN: Well nourished, well developed, in no acute distress  HEENT: normal  Neck: no JVD, carotid bruits, or masses Cardiac: RRR; no murmurs, rubs, or gallops,no edema  Respiratory:  clear to auscultation bilaterally, normal work of breathing GI: soft, nontender, nondistended, + BS MS: no deformity or atrophy  Skin: warm and dry, device site well healed Neuro:  Strength and sensation are intact Psych: euthymic mood, full affect  EKG:  EKG is ordered today. Personal review of the ekg ordered shows A paced, V paced  Personal review of the device interrogation today. Results in [place]: 01/30/2020: BUN 12; Creatinine, Ser 0.86; Hemoglobin 15.2; Platelets 269; Potassium 5.1; Sodium 140    Lipid Panel     Component Value Date/Time   CHOL 173 01/05/2018 1415   TRIG 144 01/05/2018 1415   HDL 50 01/05/2018 1415   CHOLHDL 3.5 01/05/2018 1415   CHOLHDL 3.3 08/05/2014 0844   VLDL 19 08/05/2014 0844   LDLCALC 94 01/05/2018 1415     Wt Readings from Last 3 Encounters:  05/28/20 197 lb 6.4 oz (89.5 kg)  04/13/20 198 lb 3.2 oz (89.9 kg)  02/10/20 186 lb (84.4 kg)      Other studies Reviewed: Additional studies/ records that were reviewed today include: TTE 06/25/19  Review of the above records today demonstrates:  1. Left ventricular ejection fraction, by visual estimation, is 30 to 35%. The left ventricle has moderate to severely decreased function. Normal left ventricular size. There is mildly increased left ventricular hypertrophy.  2. Abnormal septal motion consistent with left bundle branch block.  3. Left ventricular diastolic Doppler parameters are consistent with pseudonormalization pattern of LV diastolic filling.  4. Elevated mean left atrial pressure.  5. Global right ventricle has normal systolic function.The right ventricular size is normal. No increase in right ventricular  wall thickness.  6. Left atrial size was normal.  7. Right atrial size was normal.  8. The mitral valve is normal in structure. No evidence of mitral valve regurgitation.  9. The tricuspid valve is normal in structure. Tricuspid valve regurgitation is trivial. 10. The aortic valve is tricuspid Aortic valve regurgitation was not visualized by color  flow Doppler. 11. The pulmonic valve was not well visualized. Pulmonic valve regurgitation is trivial by color flow Doppler. 12. The inferior vena cava is normal in size with greater than 50% respiratory variability, suggesting right atrial pressure of 3 mmHg.  [place] 01/09/18  Previously placed Ost RCA to Mid RCA stent (DES) is widely patent. Previously placed Dist RCA- Post Atrio stent (DES) is widely patent.  Previously placed Prox Cx to Dist Cx stent (DES) is widely patent.  Ost LAD lesion is 80% stenosed. Prox LAD lesion is 60% stenosed. Ost 2nd Diag lesion is 85% stenosed.  _______________________________________________________________________________________________  RIMA graft was visualized by angiography and is large. The graft exhibits no disease. --It feels a wraparound LAD that perfuses the distal/apical half of the PDA.  SVG-RPDA graft was not injected. Origin lesion is 100% stenosed. Known CTO  SVG-OM graft was visualized by angiography. Origin to Prox Graft lesion is 100% stenosed. Known CTO  __________________________________________________________________________________________  LIMA-DIAG: (Not Injected) Origin to Prox Graft lesion is 100% stenosed. Known CTO  There is mild to moderate left ventricular systolic dysfunction. The left ventricular ejection fraction is 35-45% by visual estimate.  Normal right heart cath pressures with normal cardiac output/index.  LV end diastolic pressure is normal.  ASSESSMENT AND PLAN:  1.  Coronary artery disease: Status post CABG and PCI.  No current chest pain.    2.  Chronic  systolic heart failure: Ejection fraction 30 to 35%.  He is on carvedilol and Entresto.  He is now status post [place] CRT D implanted 02/10/2020.  Device functioning appropriately.  No changes at this time.    3.  Hypertension: Overall well controlled  4.  PVCs: Minimal noted   Current medicines are reviewed at length with the patient today.   The patient does not have concerns regarding his medicines.  The following changes were made today: None  Labs/ tests ordered today include:  Orders Placed This Encounter  Procedures  . EKG 12-Lead   Disposition:   FU with Natausha Jungwirth 9 months  Signed, Alie Moudy Meredith Leeds, MD  05/28/2020 10:00 AM     [facility] [address] [place] [address] [phone] (office) [phone] (fax)

## 2020-06-12 ENCOUNTER — Telehealth: Payer: Self-pay | Admitting: Emergency Medicine

## 2020-06-12 NOTE — Telephone Encounter (Signed)
Patient had alert for HVT episode at [facility] [place] on 06/11/20. Episode appears to be 41 beat episode of NSVT. Patient reports he was asleep and unaware of event.

## 2020-06-15 NOTE — Telephone Encounter (Signed)
If he can tolerate now that he has a pacemaker, increase bisoprolol to 5 mg.

## 2020-07-21 DIAGNOSIS — Z23 Encounter for immunization: Secondary | ICD-10-CM | POA: Diagnosis not present

## 2020-08-12 ENCOUNTER — Ambulatory Visit (INDEPENDENT_AMBULATORY_CARE_PROVIDER_SITE_OTHER): Payer: Medicare Other

## 2020-08-12 DIAGNOSIS — I255 Ischemic cardiomyopathy: Secondary | ICD-10-CM

## 2020-08-12 DIAGNOSIS — I447 Left bundle-branch block, unspecified: Secondary | ICD-10-CM

## 2020-08-12 LAB — CUP PACEART REMOTE DEVICE CHECK
Battery Remaining Longevity: 88 mo
Battery Remaining Percentage: 95.5 %
Battery Voltage: 3.01 V
Brady Statistic AP VP Percent: 72 %
Brady Statistic AP VS Percent: 1 %
Brady Statistic AS VP Percent: 26 %
Brady Statistic AS VS Percent: 1 %
Brady Statistic RA Percent Paced: 72 %
Date Time Interrogation Session: 20211208020013
Implantable Lead Implant Date: 20210607
Implantable Lead Implant Date: 20210607
Implantable Lead Implant Date: 20210607
Implantable Lead Location: 753858
Implantable Lead Location: 753859
Implantable Lead Location: 753860
Implantable Pulse Generator Implant Date: 20210607
Lead Channel Impedance Value: 480 Ohm
Lead Channel Impedance Value: 650 Ohm
Lead Channel Impedance Value: 690 Ohm
Lead Channel Pacing Threshold Amplitude: 0.75 V
Lead Channel Pacing Threshold Amplitude: 0.75 V
Lead Channel Pacing Threshold Amplitude: 1 V
Lead Channel Pacing Threshold Pulse Width: 0.5 ms
Lead Channel Pacing Threshold Pulse Width: 0.5 ms
Lead Channel Pacing Threshold Pulse Width: 0.5 ms
Lead Channel Sensing Intrinsic Amplitude: 12 mV
Lead Channel Sensing Intrinsic Amplitude: 2.6 mV
Lead Channel Setting Pacing Amplitude: 2 V
Lead Channel Setting Pacing Amplitude: 2.5 V
Lead Channel Setting Pacing Amplitude: 2.5 V
Lead Channel Setting Pacing Pulse Width: 0.5 ms
Lead Channel Setting Pacing Pulse Width: 0.5 ms
Lead Channel Setting Sensing Sensitivity: 2 mV
Pulse Gen Model: 3562
Pulse Gen Serial Number: 3818273

## 2020-08-18 ENCOUNTER — Telehealth: Payer: Self-pay | Admitting: Emergency Medicine

## 2020-08-18 NOTE — Telephone Encounter (Signed)
Patient called with questions concerning 91 day transmission report that was in myChart. Questions answered.

## 2020-08-24 NOTE — Progress Notes (Deleted)
Cardiology Office Note  Date: 08/24/2020   ID: [name], [name] 02-24-1941, MRN [id number]  PCP:  [name], MD  Cardiologist:  [name], MD Electrophysiologist:  [name], MD   Chief Complaint: CAD, BIV pacemaker, HTN  History of Present Illness: [name] is a 79 y.o. male with a history of CAD (S/P CABG x 4 ), PVC's, CHF HTN , LBBBB, BIV pacmaker, HLD, Saint Jude CRT-D implanted 02/10/2020, dilated cardiomyopathy.   Last encounter with Dr [name] on 05/28/2020. Denied any symptoms of palpitations, chest pain, shortness of breath, orthopnea, claudication, dizziness, presyncope, syncope, bleeding, sensed biventricular pacemaker placed he denied any complaints. His fatigue had improved over the prior few months. Last EF by echocardiogram was 30 to 35%. He was continuing carvedilol and Entresto. Blood pressure was overall well controlled. Blood pressure was overall well controlled. Minimal PVCs  Past Medical History:  Diagnosis Date  . Allergic rhinitis   . Arthritis    "entire body" (02/19/2015)  . Asthma   . Asthmatic bronchitis   . Biventricular cardiac pacemaker in situ 02/10/2020   [facility] (Dr. [name])  . CAD of autologous vein bypass graft without angina 04/2011   Frequent PVCs & fatigue -> MYOVIEW w/ inferior /r MV distribution --> CATH: only RIMA-LAD patent (LIMA atretic & SVGs occluded) --> Staged PCI of Native RCA & Cx; followed by PCI-distal RCA  . CAD S/P percutaneous coronary angioplasty 8/'12; 06/'16   a) PCI- Cx-OM W/ 2 overlapping Resolute DES 3x15 & 3x18 stents (3.36mm),  PCI-RCA -  Promus DES 3x38 (3.76mm);; b) 6/'16: PCI-dRCA-rPAV Promus DES 2.75X28 (3.0 mm) -- stents patent in 01/2018;  01/2018 -stents remain patent  . CAS (cerebral atherosclerosis)    CAROTID DOPPLER, 08/25/2011 - mildly abnormal  . Chronic back pain   . Dilated cardiomyopathy ([place]): EF ~30-35%, 01/05/2018   11/2019 - EF ~30%, global HK with Septal dyssynergy  --> 6/6/'21 - s/p CRT-P (St. Jude)  . Diverticulosis   . Erectile dysfunction   . Essential hypertension    Medication intolerant -- only able to take minimal doses of medications  . Family hx of colon cancer   . GERD (gastroesophageal reflux disease)   . Gout   . Hemorrhoids   . Hyperlipidemia with target LDL less than 70    Only able to tolerate very low dose statin - no interest in other options  . Kidney stones   . Myocardial infarction ([place]) 2005  . Polycythemia   . 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 Symptomatic PVCs    As the main symptom for angina  . Syncope 2010   ? unclear of etiology    Past Surgical History:  Procedure Laterality Date  . BIV PACEMAKER INSERTION CRT-P N/A 02/10/2020   Procedure: BIV PACEMAKER INSERTION CRT-P;  Surgeon: Constance Haw, MD;  Location: [place] [facility];  Service: Cardiovascular;  Laterality: N/A;  . CARDIAC CATHETERIZATION  07/15/2004   CABG recommended; continue medical therapy  . CARDIAC CATHETERIZATION  04/26/2011   Patent RIMA-LAD, atretic LIMA-Dx (previous) 100 and CTO SVG to Cx and SVG to PDA. LAD 80-90% eccentric stenosis at SP1 that has 70% stenosis. Cx: Large OM and another distal branch with 90-95% stenosis followed by lesion in the OM branch. RCA dominant 60-80% lesion proximally w/ several old lesions. PDA occluded  70-80% annular lesion in the proximal RCA. 3 of 4 grafts CTO RIMA-LAD patent  . CARDIAC CATHETERIZATION N/A 02/20/2015   Procedure: Left Heart Cath and Coronary Angiography;  Surgeon: Leonie Man, MD;  Location: [place] [facility]: Widely patent p-mRCA DES with dRCA 50%-PAVG 80% --> PCI. Patent overlapping stents --mCx-OM. 99% pLAD - patent RIMA-dLAD.  Known CTO SVG-rPL, SVG-OM3, :LIMA-D1.  Marland Kitchen CATARACT EXTRACTION W/ INTRAOCULAR LENS  IMPLANT, BILATERAL Bilateral ~ 2006/2007  . CORONARY ANGIOPLASTY WITH STENT  PLACEMENT  04/28/2011; 02/20/2015   PCI-Cx-OM 3 overlapping Resolute DES 3 mm x74mm and 3x21mm --> 3.14mm; PCI to RCA - Promus Element DES 3x81mm --> 3.21mm;; b) dRCA-rPAV: Promus Premier DES 2.73mm X 28 mm (3.0 mm)   . CORONARY ARTERY BYPASS GRAFT  07/23/2004   LIMA-Cx RIMA-LAD, SVG-diagonal, SVG-PDA  . RIGHT/LEFT HEART CATH AND CORONARY/GRAFT ANGIOGRAPHY N/A 01/09/2018   Procedure: RIGHT/LEFT HEART CATH AND CORONARY/GRAFT ANGIOGRAPHY;  Surgeon: Leonie Man, MD;; Known occluded LIMA-Diag & SVG-RCA, SVG-OM.  Mod-severe pLAD that is grafted after D1 - patent RIMA- wraparound LAD, distal 1/2 of PDA territory. Patent Cx-OM DES (ost Cx mild) & p-mRCA overlapping DES & dRCA-RPAV DES.  PCWP 12 mmHg, LVP-EDP: 140/6 - 14 mmH. RAP 2 mmHg. RVP-EDP 33/0 - 5  mmHg  . TRANSTHORACIC ECHOCARDIOGRAM  02/18/2015    Mild concentric LVH, EF 55-60%, no RWMA, Gr 1 DD, aortic sclerosis without stenosis  . TRANSTHORACIC ECHOCARDIOGRAM  12/2017; 06/2019   (New LBBB)  EF~35% with diffuse HK of septal & lateral walls.  Mildly dilated LV.  "GR 1 "DD with high filling pressures. ? w/ normal LA size; 10/20: EF 30-35%, LBBB related Septal dyskinesis, GR2 DD (but normal LA size).   . TRANSTHORACIC ECHOCARDIOGRAM  11/27/2019   (On optimal/max tolerated dose of carvedilol and Entresto): EF estimated 30% with moderate to severe decreased function and global hypokinesis.  Significant septal-lateral dyssynchrony from LBBB.  Mild LV dilation.  At least GR 1 DD.  Mildly reduced RV function but normal pressures.  Mild aortic sclerosis but no significant regurgitation.  . TRANSURETHRAL RESECTION OF PROSTATE  ~ 2002/2003    Current Outpatient Medications  Medication Sig Dispense Refill  . ALPRAZolam (XANAX) 0.5 MG tablet Take 0.5 mg by mouth 2 (two) times daily as needed for anxiety.     Marland Kitchen aspirin EC 81 MG tablet Take 81 mg by mouth daily.     . bisoprolol (ZEBETA) 5 MG tablet Take 0.5 tablets (2.5 mg total) by mouth 2 (two) times daily.  90 tablet 3  . BREO ELLIPTA 100-25 MCG/INH AEPB Inhale 1 puff into the lungs daily.    . chlorzoxazone (PARAFON) 500 MG tablet Take 500 mg by mouth daily as needed for muscle spasms.    . furosemide (LASIX) 20 MG tablet Take 1 tablet (20 mg total) by mouth as needed for edema. 90 tablet 0  . nystatin cream (MYCOSTATIN) Apply 1 application topically daily as needed (rash).     . nystatin-triamcinolone (MYCOLOG II) cream Apply 1 application topically daily as needed (yeast infections).    Vladimir Faster Glycol-Propyl Glycol (SYSTANE OP) Place 1 drop into both eyes 2 (two) times daily.    . rosuvastatin (CRESTOR) 5 MG tablet Take 5 mg by mouth every evening.     . triamcinolone cream (KENALOG) 0.1 % Apply 1 application topically daily as needed (irritation).      No current facility-administered medications for this visit.   Allergies:  Meperidine hcl and Penicillins  Social History: The patient  reports that he quit smoking about 57 years ago. His smoking use included cigarettes. He has a 7.00 pack-year smoking history. He has never used smokeless tobacco. He reports that he does not drink alcohol and does not use drugs.   Family History: The patient's family history includes Colon cancer in his brother; Heart disease in his brother; Heart disease (age of onset: 49) in his father; Hypertension (age of onset: 58) in his brother; Hypertension (age of onset: 24) in his mother; Stomach cancer in his sister; Stroke (age of onset: 16) in his mother.   ROS:  Please see the history of present illness. Otherwise, complete review of systems is positive for {NONE DEFAULTED:18576::"none"}.  All other systems are reviewed and negative.   Physical Exam: VS:  There were no vitals taken for this visit., BMI There is no height or weight on file to calculate BMI.  Wt Readings from Last 3 Encounters:  05/28/20 197 lb 6.4 oz (89.5 kg)  04/13/20 198 lb 3.2 oz (89.9 kg)  02/10/20 186 lb (84.4 kg)    General:  Patient appears comfortable at rest. HEENT: Conjunctiva and lids normal, oropharynx clear with moist mucosa. Neck: Supple, no elevated JVP or carotid bruits, no thyromegaly. Lungs: Clear to auscultation, nonlabored breathing at rest. Cardiac: Regular rate and rhythm, no S3 or significant systolic murmur, no pericardial rub. Abdomen: Soft, nontender, no hepatomegaly, bowel sounds present, no guarding or rebound. Extremities: No pitting edema, distal pulses 2+. Skin: Warm and dry. Musculoskeletal: No kyphosis. Neuropsychiatric: Alert and oriented x3, affect grossly appropriate.  ECG:  {EKG/Telemetry Strips Reviewed:[id number]}  Recent Labwork: 01/30/2020: BUN 12; Creatinine, Ser 0.86; Hemoglobin 15.2; Platelets 269; Potassium 5.1; Sodium 140     Component Value Date/Time   CHOL 173 01/05/2018 1415   TRIG 144 01/05/2018 1415   HDL 50 01/05/2018 1415   CHOLHDL 3.5 01/05/2018 1415   CHOLHDL 3.3 08/05/2014 0844   VLDL 19 08/05/2014 0844   LDLCALC 94 01/05/2018 1415    Other Studies Reviewed Today:  05/17/2020 BIV PACEMAKER INSERTION CRT-P   SURGEON: Will Camnitz, MD   PREPROCEDURE DIAGNOSES:  1. Ischemic cardiomyopathy.  2. New York Heart Association class II, heart failure chronically.  3. Left bundle-branch block.   POSTPROCEDURE DIAGNOSES:  1. Ischemic cardiomyopathy.  2. New York Heart Association class II heart failure chronically.  3. Left bundle-branch block.   PROCEDURES:  1. Left upper extremity venography  2. Biventricular ICD implantation.   INTRODUCTION:  Dustine A Grizzle is a 79 y.o. male with a ischemic CM (EF 30-35%), NYHA Class III CHF, and LBBB QRS morophology.  He has a left bundle branch block and thus would benefit from CRT pacing.  He thus presents to the EP lab for implant of a CRT P.  DESCRIPTION OF PROCEDURE: Informed written consent was obtained and the patient was brought to the electrophysiology lab in the fasting state. The patient was adequately  sedated with intravenous Versed, and fentanyl as outlined in the nursing report. The patient's left chest was prepped and draped in the usual sterile fashion by the EP lab staff. The skin overlying the left deltopectoral region was infiltrated with lidocaine for local analgesia. A 5-cm incision was made over the left deltopectoral region. A left subcutaneous pacemaker pocket was fashioned using a combination of sharp and blunt dissection. Electrocautery was used to assure hemostasis.   RA/RV Lead Placement:  The left axillary vein was cannulated with fluoroscopic visualization. No contrast was  required for this endeavor. Through the left axillary vein, a Saint Jude tendril LPA 1200M (serial [id number]) right atrial lead and a Saint Jude tendril LPA 1200 M (serial number [id number]) right ventricular pacemaker lead were advanced with fluoroscopic visualization into the right atrial appendage and right ventricular apex positions respectively. Initial atrial lead P-waves measured 3.1 mV with an impedance of 587 ohms and a threshold of 0.8 volts at 0.5 milliseconds. The right ventricular lead R-wave measured 7.1 mV with impedance of 954 ohms and a threshold of 0.6 volts at 0.5 milliseconds.   LV Lead Placement:  A Saint Jude 135 guide was advanced through the left axillary vein into the low lateral right atrium. A versa core wire was introduced through the 135 guide and used to cannulate the coronary sinus.  A selective coronary sinus venogram was performed by hand injection of nonionic contrast. This demonstrated a large CS body with very small/ atretic distal branches. There was a moderate sized lateral coronary sinus branch was noted along the mid portion of the CS body. No other posterior branches were identified.  A Whisper CSJ wire was introduced through the guide and advanced into the distal posterolateral branch. A Saint Jude [id number] (serial number [id number]) lead was advanced through the guide into the  lateral branch. This was approximately one-thirds from the base to the apex in a very lateral position. In this location, the left ventricular lead R-waves measured 10 mV with impedance of 1543 ohms and a threshold of 1 volt at 0.5 milliseconds in the bipolar LV1-LV2 configuration with no diaphragmatic stimulation observed when pacing at 10 volts output. The MB-2 guide was therefore removed.  All three leads were secured to the pectoralis fascia using #2 silk suture over the suture sleeves. The pocket then irrigated with copious gentamicin solution. The leads were then connected to a [employer] MP (serial Number [id number]) device. The pacemaker was placed into the pocket. The pocket was then closed in 3 layers with 2.0 Vicryl suture for the subcutaneous and 3.0 Vicryl suture subcuticular layers. Steri-Strips and a sterile dressing were then applied.  DFT testing was not performed today. The procedure was therefore considered completed. EBL<51ml. There were no early apparhent complications.   CONCLUSIONS:  1.  Ischemic cardiomyopathy with Left bundle-branch block and chronic New York Heart Association class II heart failure.  2. Successful biventricular pacemaker implantation.  3. No early apparent complications.     TTE 06/25/19  Review of the above records today demonstrates:  1. Left ventricular ejection fraction, by visual estimation, is 30 to 35%. The left ventricle has moderate to severely decreased function. Normal left ventricular size. There is mildly increased left ventricular hypertrophy. 2. Abnormal septal motion consistent with left bundle branch block. 3. Left ventricular diastolic Doppler parameters are consistent with pseudonormalization pattern of LV diastolic filling. 4. Elevated mean left atrial pressure. 5. Global right ventricle has normal systolic function.The right ventricular size is normal. No increase in right ventricular wall thickness. 6. Left atrial  size was normal. 7. Right atrial size was normal. 8. The mitral valve is normal in structure. No evidence of mitral valve regurgitation. 9. The tricuspid valve is normal in structure. Tricuspid valve regurgitation is trivial. 10. The aortic valve is tricuspid Aortic valve regurgitation was not visualized by color flow Doppler. 11. The pulmonic valve was not well visualized. Pulmonic valve regurgitation is trivial by color flow Doppler. 12. The inferior vena cava is normal in size with greater than  50% respiratory variability, suggesting right atrial pressure of 3 mmHg.  [place] 01/09/18 Previously placed Ost RCA to Mid RCA stent (DES) is widely patent. Previously placed Dist RCA- Post Atrio stent (DES) is widely patent. Previously placed Prox Cx to Dist Cx stent (DES) is widely patent. Ost LAD lesion is 80% stenosed. Prox LAD lesion is 60% stenosed. Ost 2nd Diag lesion is 85% stenosed. RIMA graft was visualized by angiography and is large. The graft exhibits no disease. --It feels a wraparound LAD that perfuses the distal/apical half of the PDA. SVG-RPDA graft was not injected. Origin lesion is 100% stenosed. Known CTO SVG-OM graft was visualized by angiography. Origin to Prox Graft lesion is 100% stenosed. Known CTO LIMA-DIAG: (Not Injected) Origin to Prox Graft lesion is 100% stenosed. Known CTO There is mild to moderate left ventricular systolic dysfunction. The left ventricular ejection fraction is 35-45% by visual estimate. Normal right heart cath pressures with normal cardiac output/index. LV end diastolic pressure is normal.   Assessment and Plan:  1. CAD in native artery   2. Chronic systolic heart failure ([place])   3. Essential hypertension   4. PVC's (premature ventricular contractions)   5. Biventricular cardiac pacemaker in situ    1. CAD in native artery ***  2. Chronic systolic heart failure ([facility]) ***  3. Essential hypertension ***  4. PVC's (premature ventricular  contractions) ***  5. Biventricular cardiac pacemaker in situ ***  Medication Adjustments/Labs and Tests Ordered: Current medicines are reviewed at length with the patient today.  Concerns regarding medicines are outlined above.   Disposition: Follow-up with ***  Signed, Levell July, NP 08/24/2020 8:04 PM    [place] [facility] [place], [place], [place] [address] Phone: [phone]; Fax: [phone]

## 2020-08-25 ENCOUNTER — Ambulatory Visit: Payer: Medicare Other | Admitting: Family Medicine

## 2020-08-25 NOTE — Progress Notes (Signed)
Remote pacemaker transmission.   

## 2020-09-10 DIAGNOSIS — G250 Essential tremor: Secondary | ICD-10-CM | POA: Diagnosis not present

## 2020-09-10 DIAGNOSIS — K219 Gastro-esophageal reflux disease without esophagitis: Secondary | ICD-10-CM | POA: Diagnosis not present

## 2020-09-10 DIAGNOSIS — K635 Polyp of colon: Secondary | ICD-10-CM | POA: Diagnosis not present

## 2020-09-10 DIAGNOSIS — Z6829 Body mass index (BMI) 29.0-29.9, adult: Secondary | ICD-10-CM | POA: Diagnosis not present

## 2020-09-10 DIAGNOSIS — F419 Anxiety disorder, unspecified: Secondary | ICD-10-CM | POA: Diagnosis not present

## 2020-09-10 DIAGNOSIS — E291 Testicular hypofunction: Secondary | ICD-10-CM | POA: Diagnosis not present

## 2020-09-10 DIAGNOSIS — I2510 Atherosclerotic heart disease of native coronary artery without angina pectoris: Secondary | ICD-10-CM | POA: Diagnosis not present

## 2020-09-10 DIAGNOSIS — G4700 Insomnia, unspecified: Secondary | ICD-10-CM | POA: Diagnosis not present

## 2020-09-10 DIAGNOSIS — I259 Chronic ischemic heart disease, unspecified: Secondary | ICD-10-CM | POA: Diagnosis not present

## 2020-09-10 DIAGNOSIS — N400 Enlarged prostate without lower urinary tract symptoms: Secondary | ICD-10-CM | POA: Diagnosis not present

## 2020-09-10 DIAGNOSIS — R972 Elevated prostate specific antigen [PSA]: Secondary | ICD-10-CM | POA: Diagnosis not present

## 2020-09-10 DIAGNOSIS — R799 Abnormal finding of blood chemistry, unspecified: Secondary | ICD-10-CM | POA: Diagnosis not present

## 2020-09-14 ENCOUNTER — Ambulatory Visit (HOSPITAL_COMMUNITY): Payer: Medicare Other | Attending: Internal Medicine

## 2020-09-14 ENCOUNTER — Other Ambulatory Visit: Payer: Self-pay

## 2020-09-14 DIAGNOSIS — I420 Dilated cardiomyopathy: Secondary | ICD-10-CM | POA: Insufficient documentation

## 2020-09-14 DIAGNOSIS — I447 Left bundle-branch block, unspecified: Secondary | ICD-10-CM | POA: Insufficient documentation

## 2020-09-14 DIAGNOSIS — Z9861 Coronary angioplasty status: Secondary | ICD-10-CM | POA: Diagnosis not present

## 2020-09-14 DIAGNOSIS — I5042 Chronic combined systolic (congestive) and diastolic (congestive) heart failure: Secondary | ICD-10-CM | POA: Insufficient documentation

## 2020-09-14 DIAGNOSIS — I2510 Atherosclerotic heart disease of native coronary artery without angina pectoris: Secondary | ICD-10-CM | POA: Diagnosis not present

## 2020-09-14 DIAGNOSIS — I493 Ventricular premature depolarization: Secondary | ICD-10-CM | POA: Diagnosis not present

## 2020-09-14 HISTORY — PX: TRANSTHORACIC ECHOCARDIOGRAM: SHX275

## 2020-09-14 LAB — ECHOCARDIOGRAM COMPLETE
Area-P 1/2: 2.08 cm2
S' Lateral: 3.8 cm

## 2020-09-14 MED ORDER — PERFLUTREN LIPID MICROSPHERE
1.0000 mL | INTRAVENOUS | Status: AC | PRN
  Administered 2020-09-14: 2 mL via INTRAVENOUS

## 2020-09-15 DIAGNOSIS — R600 Localized edema: Secondary | ICD-10-CM | POA: Diagnosis not present

## 2020-09-15 DIAGNOSIS — I5042 Chronic combined systolic (congestive) and diastolic (congestive) heart failure: Secondary | ICD-10-CM | POA: Diagnosis not present

## 2020-09-15 DIAGNOSIS — I10 Essential (primary) hypertension: Secondary | ICD-10-CM | POA: Diagnosis not present

## 2020-09-15 DIAGNOSIS — G250 Essential tremor: Secondary | ICD-10-CM | POA: Diagnosis not present

## 2020-09-15 DIAGNOSIS — E782 Mixed hyperlipidemia: Secondary | ICD-10-CM | POA: Diagnosis not present

## 2020-09-15 DIAGNOSIS — I2510 Atherosclerotic heart disease of native coronary artery without angina pectoris: Secondary | ICD-10-CM | POA: Diagnosis not present

## 2020-09-15 DIAGNOSIS — K635 Polyp of colon: Secondary | ICD-10-CM | POA: Diagnosis not present

## 2020-09-15 DIAGNOSIS — G8929 Other chronic pain: Secondary | ICD-10-CM | POA: Diagnosis not present

## 2020-09-15 DIAGNOSIS — R001 Bradycardia, unspecified: Secondary | ICD-10-CM | POA: Diagnosis not present

## 2020-09-15 DIAGNOSIS — R7301 Impaired fasting glucose: Secondary | ICD-10-CM | POA: Diagnosis not present

## 2020-09-15 DIAGNOSIS — I420 Dilated cardiomyopathy: Secondary | ICD-10-CM | POA: Diagnosis not present

## 2020-09-15 DIAGNOSIS — R972 Elevated prostate specific antigen [PSA]: Secondary | ICD-10-CM | POA: Diagnosis not present

## 2020-09-16 ENCOUNTER — Other Ambulatory Visit: Payer: Self-pay

## 2020-09-22 ENCOUNTER — Ambulatory Visit: Payer: Medicare Other | Admitting: Cardiology

## 2020-10-06 DIAGNOSIS — H353131 Nonexudative age-related macular degeneration, bilateral, early dry stage: Secondary | ICD-10-CM | POA: Diagnosis not present

## 2020-10-09 ENCOUNTER — Ambulatory Visit (INDEPENDENT_AMBULATORY_CARE_PROVIDER_SITE_OTHER): Payer: Medicare Other | Admitting: Cardiology

## 2020-10-09 ENCOUNTER — Other Ambulatory Visit: Payer: Self-pay

## 2020-10-09 ENCOUNTER — Encounter: Payer: Self-pay | Admitting: Cardiology

## 2020-10-09 VITALS — BP 116/63 | HR 94 | Ht 68.0 in | Wt 189.4 lb

## 2020-10-09 DIAGNOSIS — I10 Essential (primary) hypertension: Secondary | ICD-10-CM

## 2020-10-09 DIAGNOSIS — I447 Left bundle-branch block, unspecified: Secondary | ICD-10-CM

## 2020-10-09 DIAGNOSIS — I25708 Atherosclerosis of coronary artery bypass graft(s), unspecified, with other forms of angina pectoris: Secondary | ICD-10-CM

## 2020-10-09 DIAGNOSIS — Z9861 Coronary angioplasty status: Secondary | ICD-10-CM | POA: Diagnosis not present

## 2020-10-09 DIAGNOSIS — I5042 Chronic combined systolic (congestive) and diastolic (congestive) heart failure: Secondary | ICD-10-CM | POA: Diagnosis not present

## 2020-10-09 DIAGNOSIS — E785 Hyperlipidemia, unspecified: Secondary | ICD-10-CM | POA: Diagnosis not present

## 2020-10-09 DIAGNOSIS — I2510 Atherosclerotic heart disease of native coronary artery without angina pectoris: Secondary | ICD-10-CM

## 2020-10-09 DIAGNOSIS — I420 Dilated cardiomyopathy: Secondary | ICD-10-CM

## 2020-10-09 MED ORDER — ENTRESTO 24-26 MG PO TABS: ORAL_TABLET | ORAL | 2 refills | Status: DC

## 2020-10-09 NOTE — Progress Notes (Signed)
Primary Care Provider: [name], MD Cardiologist: [name], MD Electrophysiologist: [name], MD  Clinic Note: Chief Complaint  Patient presents with  . Follow-up    6 months; echo results  . Cardiomyopathy    Status post CRT-P; no PND orthopnea. Well-controlled edema. Some exertional dyspnea but mostly fatigue.  . Coronary Artery Disease    No angina    ===================================  ASSESSMENT/PLAN   Problem List Items Addressed This Visit    CAD (coronary artery disease) of artery bypass graft -atretic LIMA- circumflex, occluded SVG-RCA and SVG-diagonal. (Chronic)    Only remaining patent grafts as the RIMA to the distal LAD. Vein graft to the PDA, distal circumflex and diagonal are all occluded. The only vessel previously grafted that is not revascularized is the diagonal. He basically has stent reconstruction of the RCA and LCx.  No recurrent anginal symptoms. He remains on bisoprolol => we will try to increase to 5 mg in p.m and then gradually increase to 5 mg twice daily. We will try to reinstitute low-dose Entresto. On low-dose pravastatin (as much as he can tolerate) On maintenance dose aspirin.       Relevant Medications   pravastatin (PRAVACHOL) 10 MG tablet   sacubitril-valsartan (ENTRESTO) 24-26 MG   CAD S/P  PCI of Cx-OM w/ 2 Resolute DES 3.0 x 15 & 3.0 x 18 (3.33mm); PCI-pRCA: Promus Element DES 3.0 x 38 (3.60mm); dRCA-rPAV: Promus Premier DES 2.75x28 (3 mm) (Chronic)    Extensive stents in the RCA and RPL as well as the LCx  Plan: Continue aspirin alone without Plavix   On bisoprolol-plan to try to increase to 5 mg twice daily gradually.  On max tolerated dose of statin. Not interested in further therapy.  We will try to reinitiate Entresto.       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  Try to increase beta-blocker dose in order to improve we will increase pacing.      Relevant Medications   pravastatin (PRAVACHOL) 10 MG tablet   sacubitril-valsartan (ENTRESTO) 24-26 MG   Dilated cardiomyopathy ([place]): EF ~35%, new LBBB - no change to Coronary Anatony on Cath - Primary (Chronic)    Unfortunately, his EF has not improved I think this may very well be because he is not being paced very much.  We will try to increase his beta-blocker dose to allow for more pacing. Try to restore ventricular synchrony with CRT-P.  Thankfully, really only having NYHA class II symptoms of exertional fatigue and dyspnea with mild edema. He is currently taking Lasix maybe once or twice a week. I told him it may want to try to take it maybe 3 days a week to avoid more dyspnea and edema.  Plan: Increase bisoprolol to 5 mg twice daily and gradually try to restart Entresto at one half dose of the 24-26mg  twice daily..      Relevant Medications   pravastatin (PRAVACHOL) 10 MG tablet   sacubitril-valsartan (ENTRESTO) 24-26 MG   Chronic combined systolic and diastolic heart failure (HCC) (Chronic)    NYHA class II symptoms => edema controlled with Lasix may take a PRN. I recommend that he not be a shy of taking this.  Increase beta-blocker dose and try to reinitiate Entresto as noted.      Relevant Medications   pravastatin (PRAVACHOL) 10 MG tablet  sacubitril-valsartan (ENTRESTO) 24-26 MG   Hyperlipidemia with target LDL less than 70 (Chronic)    We have talked about this, he is fine taking pravastatin but is not taking a statin. He is not interested in other therapies such as PCSK9 inhibitors or inclisiran.  He has not really been exercising Nexletol or Zetia.      Relevant Medications   pravastatin (PRAVACHOL) 10 MG tablet   sacubitril-valsartan (ENTRESTO) 24-26 MG   Essential hypertension (Chronic)    He has started having progressive FFR. Now that he is not having much in way of  orthostatic symptoms, I think is time to try to restart some type of afterload reduction.  Plan: Increase bisoprolol gradually to 5 mg twice daily then start to add Entresto 24/26 mg with 1/2 tablet twice daily       Relevant Medications   pravastatin (PRAVACHOL) 10 MG tablet   sacubitril-valsartan (ENTRESTO) 24-26 MG     ===================================  HPI:    [name] is a 80 y.o. male with a PMH notable for CAD-CABG followed by PCI after graft failure, and subsequently developed COMBINED SYSTOLIC AND DIASTOLIC HEART FAILURE -NYHA II-III/DILATED CARDIOMYOPATHY (likely combination of microvascular ischemia and LBBB related) -now s/p CRT-P who presents today for 30-month follow-up and to review his relook echo..   CAD->CABG 07/2004 (Sx = fatigue &PVCs) -->CATH -> MV CAD --> CABG X 4 (RIMA-LAD, LIMA-Diag, SVG-OM, SVG-RCA) ? 04/2011- recurrent Sx ->Myoview + for Inf Ischemia -->Cath =>Only RIMA-LAD patent -->DES PCI of Native Cx-OM (2 overlapping Promus DES) & Native RCA ? June 2016 - DES PCI of the dRCA-RPAV. Since then he has done very well with no active anginal symptoms. He has developed complete LEFT BUNDLE BRANCH BLOCKand subsequently COMBINED ISCHEMIC/NONISCHEMIC CARDIOMYOPATHY  Cardiomyopathy: April 2019ECHO = EF of roughly 35%-noted new LBBB diagnosis. ? Relook [id number] -patent stents &RIMA-LAD.EF estimated 35 to 45%. Normal RHC pressures.Marland Kitchen PCWP 12 mmHg. LV EDP 14 mmHg ? Most recent Echo October 2020: EF 30-35%. ? Referred to EP (Dr. [name]) for ? BiV ICD, deferred b/c need for  "optimal medical management "-has not been tolerant of just on any medication.  He managed to take low dose carvedilol and Entresto x 3 months --> EF still ~30% 11/3019 --> EP decided on CRT-P (not likely CRT-D given age)  ? CRT-P 02/10/2020 [facility] Allure NP   [name] was last seen on April 13, 2020 following his CRT-P procedure. Still noted having low energy  level, but his blood pressure increasing some. He had multiple questions, but for the most part was having the procedure was finally done. He probably stopped taking Entresto due to concerns with itching, fatigue and dizziness. He also indicated that he wanted to convert back to that as bisoprolol from carvedilol. Was taking Lasix about 3 to 4 days a week for swelling. PVCs essentially gone. => Noted that his BP was getting higher. Unfortunately, he had not really got back to doing many things. Still low energy. No angina, PND orthopnea, just fatigue and exertional dyspnea.  Converted from carvedilol 3.125 mg twice daily to bisoprolol 2.5 mg twice daily.  Echo ordered  -> He is also not seen by Dr. [name] in follow-up for history of device (05/28/2020): Indicated that he was feeling little bit improved energy levels. Less fatigue. Seem to be excited about the device.  Recent Hospitalizations: None  Reviewed  CV studies:    The following studies were reviewed today: (if available, images/films reviewed:  From Epic Chart or Care Everywhere) . TTE 09/14/2020: EF estimated 30%. Moderately decreased function with global HK. GR 1 DD. Mildly reduced RV function. Mild aortic valve thickening/sclerosis but no stenosis. (Compared to prior study, no notable change in function, but decreased left ventricular dilation with slight decrease in right ventricular function).   Interval History:   [name] returns here today again with his list of about 20 questions. He is no longer taking Entresto, and switched to [employer]. Using Lasix once or twice a week. He says his blood pressures are ranging from the 140s to 160s in the morning systolic, with heart rates in the 50s.  He said that after about 3 months he is starting to feel little better, but he still has fatigue. He has not been doing very much. He does note that is been a very stressful time for him. Their son who lives in [place] died on 07-31-23 after  complications of his cardiomyopathy. This has [place] quite upset and distraught. He seems to be quite frustrated about how things would not happen correctly. He is upset that maybe they did not get him up to [facility] fast enough for him to get transplanted.   Clinical standpoint, Romie Minus seems to be doing okay no real PND orthopnea. Really not noticing much in the way of any PVCs. A little bit upset and frustrated that his EF is not improved, but he seems to be happy with the verbiage in the echocardiogram report that we discussed. The improved LV dilation seem to have him happy.  He denies any chest pain or pressure with rest or exertion. He seems to be doing okay with walking, but does note that his overall energy level is down. He is not really noticing #7 exertional dyspnea.  CV Review of Symptoms (Summary): positive for - edema, palpitations and Edema is pretty well controlled as is the palpitations. He does have some mild exertional dyspnea but more fatigue and dyspnea. negative for - chest pain, orthopnea, paroxysmal nocturnal dyspnea, rapid heart rate, shortness of breath or Syncope or near syncope, TIA/amaurosis fugax, claudication  The patient does not have symptoms concerning for COVID-19 infection (fever, chills, cough, or new shortness of breath).   REVIEWED OF SYSTEMS   Review of Systems  Constitutional: Positive for malaise/fatigue and weight loss (Intentional.).  HENT: Positive for congestion.   Respiratory: Negative for cough and shortness of breath.   Gastrointestinal: Negative for blood in stool, constipation and melena.  Genitourinary: Positive for frequency. Negative for hematuria.  Musculoskeletal: Positive for joint pain. Negative for falls.  Neurological: Negative for dizziness, weakness and headaches.  Psychiatric/Behavioral: Positive for depression (Not to depression, but is definitely sad, mourning his son's death). Negative for memory loss. The patient is not  nervous/anxious and does not have insomnia.     I have reviewed and (if needed) personally updated the patient's problem list, medications, allergies, past medical and surgical history, social and family history.   PAST MEDICAL HISTORY   Past Medical History:  Diagnosis Date  . Allergic rhinitis   . Arthritis    "entire body" (02/19/2015)  . Asthma   . Asthmatic bronchitis   . 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 [name])  . CAD of autologous vein bypass graft without angina 04/2011   Frequent PVCs & fatigue -> MYOVIEW w/ inferior /r MV distribution --> CATH: only RIMA-LAD patent (LIMA atretic & SVGs occluded) --> Staged PCI of Native  RCA & Cx; followed by PCI-distal RCA  . CAD S/P percutaneous coronary angioplasty 8/'12; 06/'16   a) PCI- Cx-OM W/ 2 overlapping Resolute DES 3x15 & 3x18 stents (3.50mm),  PCI-RCA -  Promus DES 3x38 (3.17mm);; b) 6/'16: PCI-dRCA-rPAV Promus DES 2.75X28 (3.0 mm) -- stents patent in 01/2018;  01/2018 -stents remain patent  . CAS (cerebral atherosclerosis)    CAROTID DOPPLER, 08/25/2011 - mildly abnormal  . Chronic back pain   . Dilated cardiomyopathy ([place]): EF ~30-35%, 01/05/2018   11/2019 - EF ~30%, global HK with Septal dyssynergy --> 6/6/'21 - s/p CRT-P ([facility])  . Diverticulosis   . Erectile dysfunction   . Essential hypertension    Medication intolerant -- only able to take minimal doses of medications  . Family hx of colon cancer   . GERD (gastroesophageal reflux disease)   . Gout   . Hemorrhoids   . Hyperlipidemia with target LDL less than 70    Only able to tolerate very low dose statin - no interest in other options  . Kidney stones   . Myocardial infarction ([place]) 2005  . Polycythemia   . 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 Symptomatic PVCs    As the main symptom for angina  . Syncope 2010   ?  unclear of etiology    PAST SURGICAL HISTORY   Past Surgical History:  Procedure Laterality Date  . BIV PACEMAKER INSERTION CRT-P N/A 02/10/2020   Procedure: BIV PACEMAKER INSERTION CRT-P;  Surgeon: Constance Haw, MD;  Location: [facility];  Service: Cardiovascular;  Laterality: N/A;  . CARDIAC CATHETERIZATION  07/15/2004   CABG recommended; continue medical therapy  . CARDIAC CATHETERIZATION  04/26/2011   Patent RIMA-LAD, atretic LIMA-Dx (previous) 100 and CTO SVG to Cx and SVG to PDA. LAD 80-90% eccentric stenosis at SP1 that has 70% stenosis. Cx: Large OM and another distal branch with 90-95% stenosis followed by lesion in the OM branch. RCA dominant 60-80% lesion proximally w/ several old lesions. PDA occluded 70-80% annular lesion in the proximal RCA. 3 of 4 grafts CTO RIMA-LAD patent  . CARDIAC CATHETERIZATION N/A 02/20/2015   Procedure: Left Heart Cath and Coronary Angiography;  Surgeon: Leonie Man, MD;  Location: [facility]: Widely patent p-mRCA DES with dRCA 50%-PAVG 80% --> PCI. Patent overlapping stents --mCx-OM. 99% pLAD - patent RIMA-dLAD.  Known CTO SVG-rPL, SVG-OM3, :LIMA-D1.  Marland Kitchen CATARACT EXTRACTION W/ INTRAOCULAR LENS  IMPLANT, BILATERAL Bilateral ~ 2006/2007  . CORONARY ANGIOPLASTY WITH STENT PLACEMENT  04/28/2011; 02/20/2015   PCI-Cx-OM 3 overlapping Resolute DES 3 mm x16mm and 3x56mm --> 3.96mm; PCI to RCA - Promus Element DES 3x30mm --> 3.70mm;; b) dRCA-rPAV: Promus Premier DES 2.56mm X 28 mm (3.0 mm)   . CORONARY ARTERY BYPASS GRAFT  07/23/2004   LIMA-Cx RIMA-LAD, SVG-diagonal, SVG-PDA  . RIGHT/LEFT HEART CATH AND CORONARY/GRAFT ANGIOGRAPHY N/A 01/09/2018   Procedure: RIGHT/LEFT HEART CATH AND CORONARY/GRAFT ANGIOGRAPHY;  Surgeon: Leonie Man, MD;; Known occluded LIMA-Diag & SVG-RCA, SVG-OM.  Mod-severe pLAD that is grafted after D1 - patent RIMA- wraparound LAD, distal 1/2 of PDA territory. Patent Cx-OM DES (ost Cx mild) & p-mRCA overlapping DES &  dRCA-RPAV DES.  PCWP 12 mmHg, LVP-EDP: 140/6 - 14 mmH. RAP 2 mmHg. RVP-EDP 33/0 - 5  mmHg  . TRANSTHORACIC ECHOCARDIOGRAM  02/18/2015    Mild concentric LVH, EF  55-60%, no RWMA, Gr 1 DD, aortic sclerosis without stenosis  . TRANSTHORACIC ECHOCARDIOGRAM  12/2017; 06/2019   (New LBBB)  EF~35% with diffuse HK of septal & lateral walls.  Mildly dilated LV.  "GR 1 "DD with high filling pressures. ? w/ normal LA size; 10/20: EF 30-35%, LBBB related Septal dyskinesis, GR2 DD (but normal LA size).   . TRANSTHORACIC ECHOCARDIOGRAM  11/27/2019   (On optimal/max tolerated dose of carvedilol and Entresto): EF estimated 30% with moderate to severe decreased function and global hypokinesis.  Significant septal-lateral dyssynchrony from LBBB.  Mild LV dilation.  At least GR 1 DD.  Mildly reduced RV function but normal pressures.  Mild aortic sclerosis but no significant regurgitation.  . TRANSTHORACIC ECHOCARDIOGRAM  09/14/2020   (s/p CRT-P) EF estimated 30%. Moderately decreased function with global HK. GR 1 DD. Mildly reduced RV function. Mild aortic valve thickening/sclerosis but no stenosis. (Compared to prior study, no notable change in fxn, but decreased LV r dilation with slight decrease in RV function).  . TRANSURETHRAL RESECTION OF PROSTATE  ~ 2002/2003      RLCP 01/2018: LIMA-Diag, SVG-RCA, SVG-OM known CTO. ? Patent RIMA-LAD (severe native LAD disease prior to D1 &D2 both with extensive disease) - retrograde fills to CTO &antegrade wrap-around LAD (distal 1/2 of PDA).  ? DES in LCx-OM &p-m RCA &dRCA-RPAV widely patent with patent RPDA.  ? Normal RHC #s: PCWP / LVEDP ~12-14 mmHg. RAP &RVEDP ~0-2 mmHg.    Immunization History  Administered Date(s) Administered  . Influenza Whole 06/19/2010, 06/06/2011, 06/08/2012  . Pneumococcal Polysaccharide-23 06/07/2007    MEDICATIONS/ALLERGIES   Current Meds  Medication Sig  . ALPRAZolam (XANAX) 0.5 MG tablet Take 0.5 mg by mouth 2 (two) times  daily as needed for anxiety.   Marland Kitchen aspirin EC 81 MG tablet Take 81 mg by mouth daily.   . bisoprolol (ZEBETA) 5 MG tablet Take 0.5 tablets (2.5 mg total) by mouth 2 (two) times daily.  Marland Kitchen BREO ELLIPTA 100-25 MCG/INH AEPB Inhale 1 puff into the lungs daily.  . chlorzoxazone (PARAFON) 500 MG tablet Take 500 mg by mouth daily as needed for muscle spasms.  Marland Kitchen nystatin cream (MYCOSTATIN) Apply 1 application topically daily as needed (rash).   . nystatin-triamcinolone (MYCOLOG II) cream Apply 1 application topically daily as needed (yeast infections).  Vladimir Faster Glycol-Propyl Glycol (SYSTANE OP) Place 1 drop into both eyes 2 (two) times daily.  . pravastatin (PRAVACHOL) 10 MG tablet Take 10 mg by mouth daily.  . sacubitril-valsartan (ENTRESTO) 24-26 MG Take 1/2 tablet of 24 26 mg  twice a day if tolerated  . SYMBICORT 160-4.5 MCG/ACT inhaler SMARTSIG:2 Puff(s) By Mouth Twice Daily  . triamcinolone cream (KENALOG) 0.1 % Apply 1 application topically daily as needed (irritation).   . [DISCONTINUED] rosuvastatin (CRESTOR) 5 MG tablet Take 5 mg by mouth every evening.     Allergies  Allergen Reactions  . Meperidine Hcl     Cold sweats, dizziness   . Penicillins     Severe headaches Has patient had a PCN reaction causing immediate rash, facial/tongue/throat swelling, SOB or lightheadedness with hypotension: No Has patient had a PCN reaction causing severe rash involving mucus membranes or skin necrosis: No Has patient had a PCN reaction that required hospitalization: No Has patient had a PCN reaction occurring within the last 10 years: No If all of the above answers are "NO", then may proceed with Cephalosporin use.     SOCIAL HISTORY/FAMILY HISTORY   Reviewed in  Epic:  Pertinent findings:  Social History   Tobacco Use  . Smoking status: Former Smoker    Packs/day: 1.00    Years: 7.00    Pack years: 7.00    Types: Cigarettes    Quit date: 09/06/1963    Years since quitting: 57.1  .  Smokeless tobacco: Never Used  Substance Use Topics  . Alcohol use: No  . Drug use: No   Social History   Social History Narrative   He is made to patient of mine. He works for [employer], as a Engineer, manufacturing systems. He is a former smoker but quit in 1965 after 7 years. He does not drink alcohol.   He is very busy with work, and does not get routine exercise. He plans to work maybe one more year and then will retire. 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    OBJCTIVE -PE, EKG, labs   Wt Readings from Last 3 Encounters:  10/09/20 189 lb 6.4 oz (85.9 kg)  05/28/20 197 lb 6.4 oz (89.5 kg)  04/13/20 198 lb 3.2 oz (89.9 kg)    Physical Exam: BP 116/63   Pulse 94   Ht 5\' 8"  (1.727 m)   Wt 189 lb 6.4 oz (85.9 kg)   SpO2 97%   BMI 28.80 kg/m  Physical Exam Vitals reviewed.  Constitutional:      General: He is not in acute distress.    Appearance: Normal appearance. He is normal weight. He is not ill-appearing or toxic-appearing.     Comments: Well-groomed.  HENT:     Head: Normocephalic and atraumatic.  Neck:     Vascular: No carotid bruit, hepatojugular reflux or JVD.  Cardiovascular:     Rate and Rhythm: Normal rate and regular rhythm. Occasional extrasystoles are present.    Chest Wall: PMI is displaced (Mild lateral displacement. Nonsustained).     Pulses: Normal pulses.     Heart sounds: S1 normal and S2 normal. Murmur heard.  High-pitched harsh crescendo-decrescendo early systolic murmur is present with a grade of 1/6 at the upper right sternal border radiating to the neck. No friction rub. Gallop present. S4 sounds present.   Pulmonary:     Effort: Pulmonary effort is normal. No respiratory distress.     Breath sounds: Normal breath sounds.  Chest:     Chest wall: No tenderness.  Musculoskeletal:        General: Swelling (Trivial) present. Normal range of motion.     Cervical back: Normal  range of motion and neck supple.  Skin:    General: Skin is warm and dry.  Neurological:     General: No focal deficit present.     Mental Status: He is alert and oriented to person, place, and time.     Motor: No weakness.     Gait: Gait normal.  Psychiatric:        Mood and Affect: Mood normal.        Behavior: Behavior normal.        Thought Content: Thought content normal.        Judgment: Judgment normal.     Comments: He does seem a little bit down. Sad about his son's death. Somewhat frustrated as well.     Adult ECG Report n/a  Recent Labs: September 10, 2018: TC 294, TG 130, HDL 41, LDL 190. A1c 6.3. Hgb 15.5.  Cr 0.96, K+ 5.1. Lab  Results  Component Value Date   CHOL 173 01/05/2018   HDL 50 01/05/2018   LDLCALC 94 01/05/2018   TRIG 144 01/05/2018   CHOLHDL 3.5 01/05/2018   Lab Results  Component Value Date   CREATININE 0.86 01/30/2020   BUN 12 01/30/2020   NA 140 01/30/2020   K 5.1 01/30/2020   CL 102 01/30/2020   CO2 25 01/30/2020   CBC Latest Ref Rng & Units 01/30/2020 01/05/2018 02/21/2015  WBC 3.4 - 10.8 x10E3/uL 9.7 9.8 8.8  Hemoglobin 13.0 - 17.7 g/dL 15.2 16.4 14.3  Hematocrit 37.5 - 51.0 % 45.4 48.3 42.5  Platelets 150 - 450 x10E3/uL 269 242 239    No results found for: TSH  ==================================================  COVID-19 Education: The signs and symptoms of COVID-19 were discussed with the patient and how to seek care for testing (follow up with PCP or arrange E-visit).   The importance of social distancing and COVID-19 vaccination was discussed today. The patient is practicing social distancing & Masking.   I spent a total of 45minutes with the patient spent in direct patient consultation.  Additional time spent with chart review  / charting (studies, outside notes, etc): 20 min Total Time: 65 min   Current medicines are reviewed at length with the patient today.  (+/- concerns) n/a  This visit occurred during the SARS-CoV-2 public  health emergency.  Safety protocols were in place, including screening questions prior to the visit, additional usage of staff PPE, and extensive cleaning of exam room while observing appropriate contact time as indicated for disinfecting solutions.  Notice: This dictation was prepared with Dragon dictation along with smaller phrase technology. Any transcriptional errors that result from this process are unintentional and may not be corrected upon review.  Patient Instructions / Medication Changes & Studies & Tests Ordered   Patient Instructions  Medication Instructions:    try  Increasing Bisoprolol to 5 mg  Evening dose for 2 to 3 weeks with taking 2.5 mg in the morning if tolerated try taking the morning dose at 5 mg .   If can tolerate after week   Try taking 1/2 tablet of Entresto 24/26 mg   In the morning for one month if tolerating increase to 1/2 tablet twice a day if you are not able to tolerate twice a day then reduce back to 1/2 tablet in the morning.   *If you need a refill on your cardiac medications before your next appointment, please call your pharmacy*   Lab Work:  Not needed   Testing/Procedures: Not needed   Follow-Up: At [facility], you and your health needs are our priority.  As part of our continuing mission to provide you with exceptional heart care, we have created designated Provider Care Teams.  These Care Teams include your primary Cardiologist (physician) and Advanced Practice Providers (APPs -  Physician Assistants and Nurse Practitioners) who all work together to provide you with the care you need, when you need it.     Your next appointment:   6 month(s)  The format for your next appointment:   In Person  Provider:   [name], MD   Other Instructions     Studies Ordered:   No orders of the defined types were placed in this encounter.    [name], M.D., M.S. Interventional Cardiologist   Pager # [phone] Phone #  [phone] [address] [place], [place] [address]   Thank you for choosing Heart[facility] at [facility]!!

## 2020-10-09 NOTE — Patient Instructions (Addendum)
Medication Instructions:    try  Increasing Bisoprolol to 5 mg  Evening dose for 2 to 3 weeks with taking 2.5 mg in the morning if tolerated try taking the morning dose at 5 mg .   If can tolerate after week   Try taking 1/2 tablet of Entresto 24/26 mg   In the morning for one month if tolerating increase to 1/2 tablet twice a day if you are not able to tolerate twice a day then reduce back to 1/2 tablet in the morning.   *If you need a refill on your cardiac medications before your next appointment, please call your pharmacy*   Lab Work:  Not needed   Testing/Procedures: Not needed   Follow-Up: At [facility], you and your health needs are our priority.  As part of our continuing mission to provide you with exceptional heart care, we have created designated Provider Care Teams.  These Care Teams include your primary Cardiologist (physician) and Advanced Practice Providers (APPs -  Physician Assistants and Nurse Practitioners) who all work together to provide you with the care you need, when you need it.     Your next appointment:   6 month(s)  The format for your next appointment:   In Person  Provider:   [name], MD   Other Instructions

## 2020-10-22 ENCOUNTER — Encounter: Payer: Self-pay | Admitting: Cardiology

## 2020-10-22 NOTE — Assessment & Plan Note (Addendum)
Unfortunately, his EF has not improved I think this may very well be because he is not being paced very much.  We will try to increase his beta-blocker dose to allow for more pacing. Try to restore ventricular synchrony with CRT-P.  Thankfully, really only having NYHA class II symptoms of exertional fatigue and dyspnea with mild edema. He is currently taking Lasix maybe once or twice a week. I told him it may want to try to take it maybe 3 days a week to avoid more dyspnea and edema.  Plan: Increase bisoprolol to 5 mg twice daily and gradually try to restart Entresto at one half dose of the 24-26mg  twice daily.Marland Kitchen

## 2020-10-22 NOTE — Assessment & Plan Note (Signed)
We have talked about this, he is fine taking pravastatin but is not taking a statin. He is not interested in other therapies such as PCSK9 inhibitors or inclisiran.  He has not really been exercising Nexletol or Zetia.

## 2020-10-22 NOTE — Assessment & Plan Note (Signed)
NYHA class II symptoms => edema controlled with Lasix may take a PRN. I recommend that he not be a shy of taking this.  Increase beta-blocker dose and try to reinitiate Entresto as noted.

## 2020-10-22 NOTE — Assessment & Plan Note (Signed)
He has started having progressive FFR. Now that he is not having much in way of orthostatic symptoms, I think is time to try to restart some type of afterload reduction.  Plan: Increase bisoprolol gradually to 5 mg twice daily then start to add Entresto 24/26 mg with 1/2 tablet twice daily

## 2020-10-22 NOTE — Assessment & Plan Note (Signed)
Extensive stents in the RCA and RPL as well as the LCx  Plan: Continue aspirin alone without Plavix   On bisoprolol-plan to try to increase to 5 mg twice daily gradually.  On max tolerated dose of statin. Not interested in further therapy.  We will try to reinitiate Entresto.

## 2020-10-22 NOTE — Assessment & Plan Note (Signed)
Status post CRT-P. Try to increase beta-blocker dose in order to improve we will increase pacing.

## 2020-10-22 NOTE — Assessment & Plan Note (Signed)
Only remaining patent grafts as the RIMA to the distal LAD. Vein graft to the PDA, distal circumflex and diagonal are all occluded. The only vessel previously grafted that is not revascularized is the diagonal. He basically has stent reconstruction of the RCA and LCx.  No recurrent anginal symptoms. He remains on bisoprolol => we will try to increase to 5 mg in p.m and then gradually increase to 5 mg twice daily. We will try to reinstitute low-dose Entresto. On low-dose pravastatin (as much as he can tolerate) On maintenance dose aspirin.

## 2020-11-11 ENCOUNTER — Ambulatory Visit (INDEPENDENT_AMBULATORY_CARE_PROVIDER_SITE_OTHER): Payer: Medicare Other

## 2020-11-11 DIAGNOSIS — I420 Dilated cardiomyopathy: Secondary | ICD-10-CM | POA: Diagnosis not present

## 2020-11-13 LAB — CUP PACEART REMOTE DEVICE CHECK
Battery Remaining Longevity: 92 mo
Battery Remaining Percentage: 95.5 %
Battery Voltage: 3.01 V
Brady Statistic AP VP Percent: 74 %
Brady Statistic AP VS Percent: 1 %
Brady Statistic AS VP Percent: 23 %
Brady Statistic AS VS Percent: 1 %
Brady Statistic RA Percent Paced: 73 %
Date Time Interrogation Session: 20220309033134
Implantable Lead Implant Date: 20210607
Implantable Lead Implant Date: 20210607
Implantable Lead Implant Date: 20210607
Implantable Lead Location: 753858
Implantable Lead Location: 753859
Implantable Lead Location: 753860
Implantable Pulse Generator Implant Date: 20210607
Lead Channel Impedance Value: 480 Ohm
Lead Channel Impedance Value: 560 Ohm
Lead Channel Impedance Value: 700 Ohm
Lead Channel Pacing Threshold Amplitude: 0.75 V
Lead Channel Pacing Threshold Amplitude: 0.75 V
Lead Channel Pacing Threshold Amplitude: 1 V
Lead Channel Pacing Threshold Pulse Width: 0.5 ms
Lead Channel Pacing Threshold Pulse Width: 0.5 ms
Lead Channel Pacing Threshold Pulse Width: 0.5 ms
Lead Channel Sensing Intrinsic Amplitude: 2.2 mV
Lead Channel Sensing Intrinsic Amplitude: 7.2 mV
Lead Channel Setting Pacing Amplitude: 2 V
Lead Channel Setting Pacing Amplitude: 2.5 V
Lead Channel Setting Pacing Amplitude: 2.5 V
Lead Channel Setting Pacing Pulse Width: 0.5 ms
Lead Channel Setting Pacing Pulse Width: 0.5 ms
Lead Channel Setting Sensing Sensitivity: 2 mV
Pulse Gen Model: 3562
Pulse Gen Serial Number: 3818273

## 2020-11-19 NOTE — Progress Notes (Signed)
Remote pacemaker transmission.   

## 2020-11-24 ENCOUNTER — Telehealth: Payer: Self-pay

## 2020-11-24 NOTE — Telephone Encounter (Signed)
Patient called in and wants to know if someone can explain what it means on his transmission that says "1 AMS event logged 10 seconds" I let patient know a nurse will give him a call back

## 2020-11-24 NOTE — Telephone Encounter (Signed)
Called patient to explain AMS for 10 seconds. Patient thankful for call. Advised to call back if he has any further questions or concerns.

## 2021-01-14 DIAGNOSIS — G250 Essential tremor: Secondary | ICD-10-CM | POA: Diagnosis not present

## 2021-01-14 DIAGNOSIS — R972 Elevated prostate specific antigen [PSA]: Secondary | ICD-10-CM | POA: Diagnosis not present

## 2021-01-14 DIAGNOSIS — I259 Chronic ischemic heart disease, unspecified: Secondary | ICD-10-CM | POA: Diagnosis not present

## 2021-01-14 DIAGNOSIS — N400 Enlarged prostate without lower urinary tract symptoms: Secondary | ICD-10-CM | POA: Diagnosis not present

## 2021-01-14 DIAGNOSIS — K635 Polyp of colon: Secondary | ICD-10-CM | POA: Diagnosis not present

## 2021-01-14 DIAGNOSIS — Z23 Encounter for immunization: Secondary | ICD-10-CM | POA: Diagnosis not present

## 2021-01-14 DIAGNOSIS — R799 Abnormal finding of blood chemistry, unspecified: Secondary | ICD-10-CM | POA: Diagnosis not present

## 2021-01-14 DIAGNOSIS — Z6829 Body mass index (BMI) 29.0-29.9, adult: Secondary | ICD-10-CM | POA: Diagnosis not present

## 2021-01-14 DIAGNOSIS — E291 Testicular hypofunction: Secondary | ICD-10-CM | POA: Diagnosis not present

## 2021-01-14 DIAGNOSIS — K219 Gastro-esophageal reflux disease without esophagitis: Secondary | ICD-10-CM | POA: Diagnosis not present

## 2021-01-14 DIAGNOSIS — F419 Anxiety disorder, unspecified: Secondary | ICD-10-CM | POA: Diagnosis not present

## 2021-01-14 DIAGNOSIS — G4700 Insomnia, unspecified: Secondary | ICD-10-CM | POA: Diagnosis not present

## 2021-01-14 DIAGNOSIS — I2510 Atherosclerotic heart disease of native coronary artery without angina pectoris: Secondary | ICD-10-CM | POA: Diagnosis not present

## 2021-01-15 DIAGNOSIS — N200 Calculus of kidney: Secondary | ICD-10-CM | POA: Diagnosis not present

## 2021-01-15 DIAGNOSIS — N281 Cyst of kidney, acquired: Secondary | ICD-10-CM | POA: Diagnosis not present

## 2021-01-20 DIAGNOSIS — Z86010 Personal history of colonic polyps: Secondary | ICD-10-CM | POA: Diagnosis not present

## 2021-01-20 DIAGNOSIS — I420 Dilated cardiomyopathy: Secondary | ICD-10-CM | POA: Diagnosis not present

## 2021-01-20 DIAGNOSIS — E291 Testicular hypofunction: Secondary | ICD-10-CM | POA: Diagnosis not present

## 2021-01-20 DIAGNOSIS — M545 Low back pain, unspecified: Secondary | ICD-10-CM | POA: Diagnosis not present

## 2021-01-20 DIAGNOSIS — J4540 Moderate persistent asthma, uncomplicated: Secondary | ICD-10-CM | POA: Diagnosis not present

## 2021-01-20 DIAGNOSIS — E782 Mixed hyperlipidemia: Secondary | ICD-10-CM | POA: Diagnosis not present

## 2021-01-20 DIAGNOSIS — R978 Other abnormal tumor markers: Secondary | ICD-10-CM | POA: Diagnosis not present

## 2021-01-20 DIAGNOSIS — G4700 Insomnia, unspecified: Secondary | ICD-10-CM | POA: Diagnosis not present

## 2021-01-20 DIAGNOSIS — I10 Essential (primary) hypertension: Secondary | ICD-10-CM | POA: Diagnosis not present

## 2021-01-20 DIAGNOSIS — G250 Essential tremor: Secondary | ICD-10-CM | POA: Diagnosis not present

## 2021-01-20 DIAGNOSIS — R7301 Impaired fasting glucose: Secondary | ICD-10-CM | POA: Diagnosis not present

## 2021-01-20 DIAGNOSIS — I5042 Chronic combined systolic (congestive) and diastolic (congestive) heart failure: Secondary | ICD-10-CM | POA: Diagnosis not present

## 2021-01-21 DIAGNOSIS — N281 Cyst of kidney, acquired: Secondary | ICD-10-CM | POA: Diagnosis not present

## 2021-01-21 DIAGNOSIS — N401 Enlarged prostate with lower urinary tract symptoms: Secondary | ICD-10-CM | POA: Diagnosis not present

## 2021-01-21 DIAGNOSIS — R972 Elevated prostate specific antigen [PSA]: Secondary | ICD-10-CM | POA: Diagnosis not present

## 2021-01-21 DIAGNOSIS — N35013 Post-traumatic anterior urethral stricture: Secondary | ICD-10-CM | POA: Diagnosis not present

## 2021-01-21 DIAGNOSIS — N200 Calculus of kidney: Secondary | ICD-10-CM | POA: Diagnosis not present

## 2021-01-21 DIAGNOSIS — N138 Other obstructive and reflux uropathy: Secondary | ICD-10-CM | POA: Diagnosis not present

## 2021-01-21 DIAGNOSIS — N529 Male erectile dysfunction, unspecified: Secondary | ICD-10-CM | POA: Diagnosis not present

## 2021-02-10 ENCOUNTER — Ambulatory Visit (INDEPENDENT_AMBULATORY_CARE_PROVIDER_SITE_OTHER): Payer: Medicare Other

## 2021-02-10 DIAGNOSIS — I420 Dilated cardiomyopathy: Secondary | ICD-10-CM | POA: Diagnosis not present

## 2021-02-10 LAB — CUP PACEART REMOTE DEVICE CHECK
Battery Remaining Longevity: 89 mo
Battery Remaining Percentage: 95.5 %
Battery Voltage: 3.01 V
Brady Statistic AP VP Percent: 77 %
Brady Statistic AP VS Percent: 1 %
Brady Statistic AS VP Percent: 21 %
Brady Statistic AS VS Percent: 1 %
Brady Statistic RA Percent Paced: 76 %
Date Time Interrogation Session: 20220608020007
Implantable Lead Implant Date: 20210607
Implantable Lead Implant Date: 20210607
Implantable Lead Implant Date: 20210607
Implantable Lead Location: 753858
Implantable Lead Location: 753859
Implantable Lead Location: 753860
Implantable Pulse Generator Implant Date: 20210607
Lead Channel Impedance Value: 440 Ohm
Lead Channel Impedance Value: 490 Ohm
Lead Channel Impedance Value: 710 Ohm
Lead Channel Pacing Threshold Amplitude: 0.75 V
Lead Channel Pacing Threshold Amplitude: 0.75 V
Lead Channel Pacing Threshold Amplitude: 1 V
Lead Channel Pacing Threshold Pulse Width: 0.5 ms
Lead Channel Pacing Threshold Pulse Width: 0.5 ms
Lead Channel Pacing Threshold Pulse Width: 0.5 ms
Lead Channel Sensing Intrinsic Amplitude: 3.8 mV
Lead Channel Sensing Intrinsic Amplitude: 7.5 mV
Lead Channel Setting Pacing Amplitude: 2 V
Lead Channel Setting Pacing Amplitude: 2.5 V
Lead Channel Setting Pacing Amplitude: 2.5 V
Lead Channel Setting Pacing Pulse Width: 0.5 ms
Lead Channel Setting Pacing Pulse Width: 0.5 ms
Lead Channel Setting Sensing Sensitivity: 2 mV
Pulse Gen Model: 3562
Pulse Gen Serial Number: 3818273

## 2021-02-22 ENCOUNTER — Telehealth: Payer: Self-pay | Admitting: Cardiology

## 2021-02-22 NOTE — Telephone Encounter (Signed)
Patient requested to speak with Dr. Allison Quarry nurse regarding medication he says needs dosage upped. Please call

## 2021-02-23 MED ORDER — BISOPROLOL FUMARATE 5 MG PO TABS: 5.0000 mg | ORAL_TABLET | Freq: Every day | ORAL | 3 refills | Status: DC

## 2021-02-23 NOTE — Telephone Encounter (Signed)
Spoke to patient. Patient states he needs refill on Bisoprolol  since Dr Ellyn Hack increase dosage to  2.5 mg  ( 1/2 tablet of 5 mg) in the morning and 5 mg in the evening.  ( See office note 10/09/20)  Patient request a 90 day supply with refills.  RN informed patient the prescription will be sent pharmacy . Patient verbalized understanding.

## 2021-02-23 NOTE — Telephone Encounter (Signed)
Left message to call back  

## 2021-02-25 NOTE — Telephone Encounter (Signed)
Patient states there is a problem with the Bisoprolol that was sent in. He states the amount per day was incorrect according to the pharmacy so the insurance company won't pay for it.

## 2021-02-26 ENCOUNTER — Other Ambulatory Visit: Payer: Self-pay

## 2021-02-26 ENCOUNTER — Encounter: Payer: Self-pay | Admitting: Cardiology

## 2021-02-26 ENCOUNTER — Ambulatory Visit (INDEPENDENT_AMBULATORY_CARE_PROVIDER_SITE_OTHER): Payer: Medicare Other | Admitting: Cardiology

## 2021-02-26 VITALS — BP 132/70 | HR 60 | Ht 68.0 in | Wt 182.0 lb

## 2021-02-26 DIAGNOSIS — I255 Ischemic cardiomyopathy: Secondary | ICD-10-CM

## 2021-02-26 MED ORDER — BISOPROLOL FUMARATE 5 MG PO TABS: 5.0000 mg | ORAL_TABLET | Freq: Two times a day (BID) | ORAL | 3 refills | Status: DC

## 2021-02-26 NOTE — Telephone Encounter (Signed)
Patient called back to informed direction for medication need to reflect  "one tablet  twice a day "   RN informed patient will send new prescription into the pharmacy . E-sent pharmacy

## 2021-02-26 NOTE — Progress Notes (Signed)
Electrophysiology Office Note   Date:  02/26/2021   ID:  [name] 09-21-1940, MRN [id number]  PCP:  [name] Squibb, MD  Cardiologist:  Ellyn Hack Primary Electrophysiologist:  Kalden Wanke Meredith Leeds, MD    Chief Complaint: CHF   History of Present Illness: [name] is a 80 y.o. male who is being seen today for the evaluation of CHF at the request of [name] Squibb, MD. Presenting today for electrophysiology evaluation.  He has a history of coronary artery disease status post CABG, PVCs, CHF, hypertension, hyperlipidemia.  He has a left bundle branch block.  He is status post [place] CRT-P implanted 02/10/2020.  Today, denies symptoms of palpitations, chest pain, shortness of breath, orthopnea, PND, lower extremity edema, claudication, dizziness, presyncope, syncope, bleeding, or neurologic sequela. The patient is tolerating medications without difficulties.  He has been feeling well.  He has no chest pain or shortness of breath.  Is able do all of his daily activities.  When he exerts himself, at times he has pain around his pacemaker site when he is using his left arm.  Otherwise he has been feeling well without complaint.  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   Frequent PVCs & fatigue -> MYOVIEW w/ inferior /r MV distribution --> CATH: only RIMA-LAD patent (LIMA atretic & SVGs occluded) --> Staged PCI of Native RCA & Cx; followed by PCI-distal RCA   CAD S/P percutaneous coronary angioplasty 8/'12; 06/'16   a) PCI- Cx-OM W/ 2 overlapping Resolute DES 3x15 & 3x18 stents (3.45mm),  PCI-RCA -  Promus DES 3x38 (3.9mm);; b) 6/'16: [place] DES 2.75X28 (3.0 mm) -- stents patent in 01/2018;  01/2018 -stents remain patent   CAS  (cerebral atherosclerosis)    CAROTID DOPPLER, 08/25/2011 - mildly abnormal   Chronic back pain    Dilated cardiomyopathy ([place]): EF ~30-35%, 01/05/2018   11/2019 - EF ~30%, global HK with Septal dyssynergy --> 6/6/'21 - s/p CRT-P (St. Jude)   Diverticulosis    Erectile dysfunction    Essential hypertension    Medication intolerant -- only able to take minimal doses of medications   Family hx of colon cancer    GERD (gastroesophageal reflux disease)    Gout    Hemorrhoids    Hyperlipidemia with target LDL less than 70    Only able to tolerate very low dose statin - no interest in other options   Kidney stones    Myocardial infarction ([place]) 2005   Polycythemia    S/P CABG x 4 07/2004   Cath for Fatigue & PVCs ==> MV CAD ==> CABG: pRIMA-LAD, pLIMA-LCx, SVG-D1, SVG-rPDA --> ONLY pRIMA-LAD patent   Symptomatic PVCs    As the main symptom for angina   Syncope 2010   ? unclear of etiology   Past Surgical History:  Procedure Laterality Date   BIV PACEMAKER INSERTION CRT-P N/A 02/10/2020   Procedure: BIV PACEMAKER INSERTION CRT-P;  Surgeon: Constance Haw, MD;  Location: [place] [facility];  Service: Cardiovascular;  Laterality: N/A;   CARDIAC CATHETERIZATION  07/15/2004   CABG recommended; continue medical therapy   CARDIAC CATHETERIZATION  04/26/2011   Patent RIMA-LAD, atretic LIMA-Dx (previous) 100  and CTO SVG to Cx and SVG to PDA. LAD 80-90% eccentric stenosis at SP1 that has 70% stenosis. Cx: Large OM and another distal branch with 90-95% stenosis followed by lesion in the OM branch. RCA dominant 60-80% lesion proximally w/ several old lesions. PDA occluded 70-80% annular lesion in the proximal RCA. 3 of 4 grafts CTO RIMA-LAD patent   CARDIAC CATHETERIZATION N/A 02/20/2015   Procedure: Left Heart Cath and Coronary Angiography;  Surgeon: Leonie Man, MD;  Location: [place] [facility]: Widely patent p-mRCA DES with dRCA 50%-PAVG 80% --> PCI. Patent overlapping stents --mCx-OM. 99%  pLAD - patent RIMA-dLAD.  Known CTO SVG-rPL, SVG-OM3, :LIMA-D1.   CATARACT EXTRACTION W/ INTRAOCULAR LENS  IMPLANT, BILATERAL Bilateral ~ 2006/2007   CORONARY ANGIOPLASTY WITH STENT PLACEMENT  04/28/2011; 02/20/2015   PCI-Cx-OM 3 overlapping Resolute DES 3 mm x63mm and 3x49mm --> 3.91mm; PCI to RCA - Promus Element DES 3x67mm --> 3.66mm;; b) dRCA-rPAV: Promus Premier DES 2.11mm X 28 mm (3.0 mm)    CORONARY ARTERY BYPASS GRAFT  07/23/2004   LIMA-Cx RIMA-LAD, SVG-diagonal, SVG-PDA   RIGHT/LEFT HEART CATH AND CORONARY/GRAFT ANGIOGRAPHY N/A 01/09/2018   Procedure: RIGHT/LEFT HEART CATH AND CORONARY/GRAFT ANGIOGRAPHY;  Surgeon: Leonie Man, MD;; Known occluded LIMA-Diag & SVG-RCA, SVG-OM.  Mod-severe pLAD that is grafted after D1 - patent RIMA- wraparound LAD, distal 1/2 of PDA territory. Patent Cx-OM DES (ost Cx mild) & p-mRCA overlapping DES & dRCA-RPAV DES.  PCWP 12 mmHg, LVP-EDP: 140/6 - 14 mmH. RAP 2 mmHg. RVP-EDP 33/0 - 5  mmHg   TRANSTHORACIC ECHOCARDIOGRAM  02/18/2015    Mild concentric LVH, EF 55-60%, no RWMA, Gr 1 DD, aortic sclerosis without stenosis   TRANSTHORACIC ECHOCARDIOGRAM  12/2017; 06/2019   (New LBBB)  EF~35% with diffuse HK of septal & lateral walls.  Mildly dilated LV.  "GR 1 "DD with high filling pressures. ? w/ normal LA size; 10/20: EF 30-35%, LBBB related Septal dyskinesis, GR2 DD (but normal LA size).    TRANSTHORACIC ECHOCARDIOGRAM  11/27/2019   (On optimal/max tolerated dose of carvedilol and Entresto): EF estimated 30% with moderate to severe decreased function and global hypokinesis.  Significant septal-lateral dyssynchrony from LBBB.  Mild LV dilation.  At least GR 1 DD.  Mildly reduced RV function but normal pressures.  Mild aortic sclerosis but no significant regurgitation.   TRANSTHORACIC ECHOCARDIOGRAM  09/14/2020   (s/p CRT-P) EF estimated 30%. Moderately decreased function with global HK. GR 1 DD. Mildly reduced RV function. Mild aortic valve thickening/sclerosis but  no stenosis. (Compared to prior study, no notable change in fxn, but decreased LV r dilation with slight decrease in RV function).   TRANSURETHRAL RESECTION OF PROSTATE  ~ 2002/2003     Current Outpatient Medications  Medication Sig Dispense Refill   ALPRAZolam (XANAX) 0.5 MG tablet Take 0.5 mg by mouth 2 (two) times daily as needed for anxiety.      aspirin EC 81 MG tablet Take 81 mg by mouth daily.      bisoprolol (ZEBETA) 5 MG tablet Take 1 tablet (5 mg total) by mouth in the morning and at bedtime. Or as directed 180 tablet 3   chlorzoxazone (PARAFON) 500 MG tablet Take 500 mg by mouth daily as needed for muscle spasms.     furosemide (LASIX) 20 MG tablet Take 1 tablet (20 mg total) by mouth as needed for edema. 90 tablet 0   nystatin cream (MYCOSTATIN) Apply 1 application topically daily as needed (rash).  nystatin-triamcinolone (MYCOLOG II) cream Apply 1 application topically daily as needed (yeast infections).     Polyethyl Glycol-Propyl Glycol (SYSTANE OP) Place 1 drop into both eyes 2 (two) times daily.     pravastatin (PRAVACHOL) 10 MG tablet Take 10 mg by mouth daily.     SYMBICORT 160-4.5 MCG/ACT inhaler SMARTSIG:2 Puff(s) By Mouth Twice Daily     triamcinolone cream (KENALOG) 0.1 % Apply 1 application topically daily as needed (irritation).      No current facility-administered medications for this visit.    Allergies:   Meperidine hcl and Penicillins   Social History:  The patient  reports that he quit smoking about 57 years ago. His smoking use included cigarettes. He has a 7.00 pack-year smoking history. He has never used smokeless tobacco. He reports that he does not drink alcohol and does not use drugs.   Family History:  The patient's family history includes Colon cancer in his brother; Heart disease in his brother; Heart disease (age of onset: 57) in his father; Hypertension (age of onset: 78) in his brother; Hypertension (age of onset: 81) in his mother; Stomach  cancer in his sister; Stroke (age of onset: 60) in his mother.   ROS:  Please see the history of present illness.   Otherwise, review of systems is positive for none.   All other systems are reviewed and negative.   PHYSICAL EXAM: VS:  BP 132/70   Pulse 60   Ht 5\' 8"  (1.727 m)   Wt 182 lb (82.6 kg)   BMI 27.67 kg/m  , BMI Body mass index is 27.67 kg/m. GEN: Well nourished, well developed, in no acute distress  HEENT: normal  Neck: no JVD, carotid bruits, or masses Cardiac: RRR; no murmurs, rubs, or gallops,no edema  Respiratory:  clear to auscultation bilaterally, normal work of breathing GI: soft, nontender, nondistended, + BS MS: no deformity or atrophy  Skin: warm and dry, device site well healed Neuro:  Strength and sensation are intact Psych: euthymic mood, full affect  EKG:  EKG is ordered today. Personal review of the ekg ordered shows AV paced  Personal review of the device interrogation today. Results in [place]: No results found for requested labs within last 8760 hours.    Lipid Panel     Component Value Date/Time   CHOL 173 01/05/2018 1415   TRIG 144 01/05/2018 1415   HDL 50 01/05/2018 1415   CHOLHDL 3.5 01/05/2018 1415   CHOLHDL 3.3 08/05/2014 0844   VLDL 19 08/05/2014 0844   LDLCALC 94 01/05/2018 1415     Wt Readings from Last 3 Encounters:  02/26/21 182 lb (82.6 kg)  10/09/20 189 lb 6.4 oz (85.9 kg)  05/28/20 197 lb 6.4 oz (89.5 kg)      Other studies Reviewed: Additional studies/ records that were reviewed today include: TTE 06/25/19  Review of the above records today demonstrates:  1. Left ventricular ejection fraction, by visual estimation, is 30 to 35%. The left ventricle has moderate to severely decreased function. Normal left ventricular size. There is mildly increased left ventricular hypertrophy.  2. Abnormal septal motion consistent with left bundle branch block.  3. Left ventricular diastolic Doppler parameters are  consistent with pseudonormalization pattern of LV diastolic filling.  4. Elevated mean left atrial pressure.  5. Global right ventricle has normal systolic function.The right ventricular size is normal. No increase in right ventricular wall thickness.  6. Left atrial size was normal.  7.  Right atrial size was normal.  8. The mitral valve is normal in structure. No evidence of mitral valve regurgitation.  9. The tricuspid valve is normal in structure. Tricuspid valve regurgitation is trivial. 10. The aortic valve is tricuspid Aortic valve regurgitation was not visualized by color flow Doppler. 11. The pulmonic valve was not well visualized. Pulmonic valve regurgitation is trivial by color flow Doppler. 12. The inferior vena cava is normal in size with greater than 50% respiratory variability, suggesting right atrial pressure of 3 mmHg.  [place] 01/09/18 Previously placed Ost RCA to Mid RCA stent (DES) is widely patent. Previously placed Dist RCA- Post Atrio stent (DES) is widely patent. Previously placed Prox Cx to Dist Cx stent (DES) is widely patent. Ost LAD lesion is 80% stenosed. Prox LAD lesion is 60% stenosed. Ost 2nd Diag lesion is 85% stenosed. _______________________________________________________________________________________________ RIMA graft was visualized by angiography and is large. The graft exhibits no disease. --It feels a wraparound LAD that perfuses the distal/apical half of the PDA. SVG-RPDA graft was not injected. Origin lesion is 100% stenosed. Known CTO SVG-OM graft was visualized by angiography. Origin to Prox Graft lesion is 100% stenosed. Known CTO __________________________________________________________________________________________ LIMA-DIAG: (Not Injected) Origin to Prox Graft lesion is 100% stenosed. Known CTO There is mild to moderate left ventricular systolic dysfunction. The left ventricular ejection fraction is 35-45% by visual estimate. Normal right heart  cath pressures with normal cardiac output/index. LV end diastolic pressure is normal.  ASSESSMENT AND PLAN:  1.  Coronary artery disease: Status post CABG and PCI.  No current chest pain.    2.  Chronic systolic heart failure: Ejection fraction 30 to 35%.  Currently on carvedilol and Entresto.  Status post [facility] Jude CRT-P implanted 02/10/2020.  Device functioning appropriately.  He was having PMT.  PVARP was changed.  Rate response was also turned on.  3.  Hypertension: Currently well controlled  4.  PVCs: Minimal noted.   Current medicines are reviewed at length with the patient today.   The patient does not have concerns regarding his medicines.  The following changes were made today: None  Labs/ tests ordered today include:  Orders Placed This Encounter  Procedures   EKG 12-Lead    Disposition:   FU with Deshannon Seide 12 months  Signed, Kristel Durkee Meredith Leeds, MD  02/26/2021 3:56 PM     [place] [id number] [phone] (office) [phone] (fax)

## 2021-02-26 NOTE — Addendum Note (Signed)
Addended by: Raiford Simmonds on: 02/26/2021 09:20 AM   Modules accepted: Orders

## 2021-03-05 NOTE — Progress Notes (Signed)
Remote pacemaker transmission.   

## 2021-03-26 DIAGNOSIS — M545 Low back pain, unspecified: Secondary | ICD-10-CM | POA: Diagnosis not present

## 2021-03-26 DIAGNOSIS — I10 Essential (primary) hypertension: Secondary | ICD-10-CM | POA: Diagnosis not present

## 2021-03-26 DIAGNOSIS — R251 Tremor, unspecified: Secondary | ICD-10-CM | POA: Diagnosis not present

## 2021-05-12 ENCOUNTER — Ambulatory Visit (INDEPENDENT_AMBULATORY_CARE_PROVIDER_SITE_OTHER): Payer: Medicare Other

## 2021-05-12 DIAGNOSIS — I420 Dilated cardiomyopathy: Secondary | ICD-10-CM

## 2021-05-12 LAB — CUP PACEART REMOTE DEVICE CHECK
Battery Remaining Longevity: 79 mo
Battery Remaining Percentage: 84 %
Battery Voltage: 2.99 V
Brady Statistic AP VP Percent: 92 %
Brady Statistic AP VS Percent: 1 %
Brady Statistic AS VP Percent: 6.9 %
Brady Statistic AS VS Percent: 1 %
Brady Statistic RA Percent Paced: 91 %
Date Time Interrogation Session: 20220907020010
Implantable Lead Implant Date: 20210607
Implantable Lead Implant Date: 20210607
Implantable Lead Implant Date: 20210607
Implantable Lead Location: 753858
Implantable Lead Location: 753859
Implantable Lead Location: 753860
Implantable Pulse Generator Implant Date: 20210607
Lead Channel Impedance Value: 440 Ohm
Lead Channel Impedance Value: 480 Ohm
Lead Channel Impedance Value: 760 Ohm
Lead Channel Pacing Threshold Amplitude: 0.5 V
Lead Channel Pacing Threshold Amplitude: 0.75 V
Lead Channel Pacing Threshold Amplitude: 0.875 V
Lead Channel Pacing Threshold Pulse Width: 0.5 ms
Lead Channel Pacing Threshold Pulse Width: 0.5 ms
Lead Channel Pacing Threshold Pulse Width: 0.5 ms
Lead Channel Sensing Intrinsic Amplitude: 3.1 mV
Lead Channel Sensing Intrinsic Amplitude: 6.5 mV
Lead Channel Setting Pacing Amplitude: 2 V
Lead Channel Setting Pacing Amplitude: 2 V
Lead Channel Setting Pacing Amplitude: 2 V
Lead Channel Setting Pacing Pulse Width: 0.5 ms
Lead Channel Setting Pacing Pulse Width: 0.5 ms
Lead Channel Setting Sensing Sensitivity: 2 mV
Pulse Gen Model: 3562
Pulse Gen Serial Number: 3818273

## 2021-05-17 ENCOUNTER — Encounter: Payer: Self-pay | Admitting: Cardiology

## 2021-05-17 ENCOUNTER — Ambulatory Visit (INDEPENDENT_AMBULATORY_CARE_PROVIDER_SITE_OTHER): Payer: Medicare Other | Admitting: Cardiology

## 2021-05-17 ENCOUNTER — Other Ambulatory Visit: Payer: Self-pay

## 2021-05-17 VITALS — BP 124/56 | HR 62 | Ht 68.0 in | Wt 180.0 lb

## 2021-05-17 DIAGNOSIS — Z9861 Coronary angioplasty status: Secondary | ICD-10-CM | POA: Diagnosis not present

## 2021-05-17 DIAGNOSIS — I5042 Chronic combined systolic (congestive) and diastolic (congestive) heart failure: Secondary | ICD-10-CM

## 2021-05-17 DIAGNOSIS — I255 Ischemic cardiomyopathy: Secondary | ICD-10-CM | POA: Diagnosis not present

## 2021-05-17 DIAGNOSIS — I493 Ventricular premature depolarization: Secondary | ICD-10-CM | POA: Diagnosis not present

## 2021-05-17 DIAGNOSIS — I2510 Atherosclerotic heart disease of native coronary artery without angina pectoris: Secondary | ICD-10-CM | POA: Diagnosis not present

## 2021-05-17 DIAGNOSIS — E785 Hyperlipidemia, unspecified: Secondary | ICD-10-CM

## 2021-05-17 DIAGNOSIS — I10 Essential (primary) hypertension: Secondary | ICD-10-CM | POA: Diagnosis not present

## 2021-05-17 DIAGNOSIS — R600 Localized edema: Secondary | ICD-10-CM

## 2021-05-17 DIAGNOSIS — I25708 Atherosclerosis of coronary artery bypass graft(s), unspecified, with other forms of angina pectoris: Secondary | ICD-10-CM | POA: Diagnosis not present

## 2021-05-17 DIAGNOSIS — I420 Dilated cardiomyopathy: Secondary | ICD-10-CM

## 2021-05-17 MED ORDER — BISOPROLOL-HYDROCHLOROTHIAZIDE 2.5-6.25 MG PO TABS: 1.0000 | ORAL_TABLET | Freq: Every day | ORAL | 11 refills | Status: DC

## 2021-05-17 NOTE — Patient Instructions (Addendum)
Medication Instructions:  START Bisoprolol-Hydrochlorothiazide 2.5-6.25 mg once daily in the morning Take Bisoprolol 5 mg in the evening  *If you need a refill on your cardiac medications before your next appointment, please call your pharmacy*   Lab Work: None ordered If you have labs (blood work) drawn today and your tests are completely normal, you will receive your results only by: [place] (if you have MyChart) OR A paper copy in the mail If you have any lab test that is abnormal or we need to change your treatment, we will call you to review the results.   Testing/Procedures: None ordered   Follow-Up: At [facility], you and your health needs are our priority.  As part of our continuing mission to provide you with exceptional heart care, we have created designated Provider Care Teams.  These Care Teams include your primary Cardiologist (physician) and Advanced Practice Providers (APPs -  Physician Assistants and Nurse Practitioners) who all work together to provide you with the care you need, when you need it.  We recommend signing up for the patient portal called "MyChart".  Sign up information is provided on this After Visit Summary.  MyChart is used to connect with patients for Virtual Visits (Telemedicine).  Patients are able to view lab/test results, encounter notes, upcoming appointments, etc.  Non-urgent messages can be sent to your provider as well.   To learn more about what you can do with MyChart, go to [email or web address].    Your next appointment:   6 month(s)  The format for your next appointment:   In Person  Provider:   You may see [name], MD or one of the following Advanced Practice Providers on your designated Care Team:   Rosaria Ferries, PA-C Caron Presume, [place]

## 2021-05-17 NOTE — Progress Notes (Signed)
Primary Care Provider: [name], MD Cardiologist: [name], MD Electrophysiologist: [name], MD  Clinic Note: Chief Complaint  Patient presents with   Follow-up    6 months.   Coronary Artery Disease    No angina, just fatigue   Cardiomyopathy    Little bit tired and short of breath with activity, but no significant PND, orthopnea or edema.    ===================================  ASSESSMENT/PLAN   Problem List Items Addressed This Visit       Cardiology Problems   CAD (coronary artery disease) of artery bypass graft -atretic LIMA- circumflex, occluded SVG-RCA and SVG-diagonal. (Chronic)   Relevant Medications   bisoprolol-hydrochlorothiazide (ZIAC) 2.5-6.25 MG tablet   CAD S/P  PCI of Cx-OM w/ 2 Resolute DES 3.0 x 15 & 3.0 x 18 (3.32m); PCI-pRCA: Promus Element DES 3.0 x 38 (3.245m; dRCA-rPAV: Promus Premier DES 2.75x28 (3 mm) (Chronic)    Pretty much all but 1 graft occluded.  The remainder of his non-LAD vessels are all stented.  The circumflex and RCA as well as distal RCA vessels have stents that are patent based on last cath.  In the absence of active symptoms, would not investigate further at this time.  Plan: Continue aspirin, beta-blocker and minimal dose statin.  This is the much as he can tolerate  Continue to encourage activity and exercise. He is not on Plavix having had issues with bleeding.  Only on aspirin.      Relevant Medications   bisoprolol-hydrochlorothiazide (ZIAC) 2.5-6.25 MG tablet   Dilated cardiomyopathy ([facility] EF ~35%, new LBBB - no change to Coronary Anatony on Cath - Primary (Chronic)    EF did not improve with CRT-P.  But he does feel little bit better.  He is tolerating the higher dose of beta-blocker and I am hoping that over time he may be on titrating further.  Right now he is taking total of 2.5 mg twice daily bisoprolol with 1 dose having a little bit of HCTZ which keeps him from MD uses less Lasix.  If I was  not worried about his renal function, could consider digoxin, but with his advanced age, will prefer to avoid it.  Did not do well on spironolactone in the past nor a ARB.  Could not tolerated Entresto.  Will consider FaWilder Glader Jardiance next visit -> have been reluctant because of his urologic issues..      Relevant Medications   bisoprolol-hydrochlorothiazide (ZIAC) 2.5-6.25 MG tablet   Chronic combined systolic and diastolic heart failure (HCC) (Chronic)    NYHA class II symptoms with well-controlled edema on low-dose Lasix with HCTZ portion of bisoprolol.  Tolerating increased dose of beta-blocker with the 1 dose having HCTZ, essentially taking 2.5 mg twice daily bisoprolol.  Did not tolerate Entresto or even low-dose ARB.  Has not tolerated spironolactone, and I am reluctant of using digoxin given his advanced age and propensity for renal insufficiency.  As long as he is feeling relatively stable, no changes.      Relevant Medications   bisoprolol-hydrochlorothiazide (ZIAC) 2.5-6.25 MG tablet   Hyperlipidemia with target LDL less than 70 (Chronic)    We talked about it very briefly, he is not interested in doing any additional medical therapy for now.  He understands that there is risk, but he does not want to take any risk taking the medications even if it is a PCSK9 inhibitor or inclisiran.  He is agreeable to continue current dose of pravastatin, but not did  not tolerate even higher dose at 20 mg.      Relevant Medications   bisoprolol-hydrochlorothiazide (ZIAC) 2.5-6.25 MG tablet   Essential hypertension (Chronic)    Actually seems to doing better with higher blood pressures, he did not tolerate Entresto.  He is doing okay with bisoprolol.  We reviewed how it adjusting based on what he has been able tolerate. Bisoprolol- HCTC 2.5-6.125 in AM & Bisoprolol 2.5 mg in PM He has never been on tolerating the medications, so I would asked that we do not try to put him back on  Entresto or other medications because this would not work.  He is simply on a low-dose Lasix that he takes as needed plus these 2 medications.      Relevant Medications   bisoprolol-hydrochlorothiazide (ZIAC) 2.5-6.25 MG tablet   PVC's (premature ventricular contractions) (Chronic)    Doing better with Bisoprolol - will change to: Bisoprolol- HCTC 2.5-6.125 in AM & Bisoprolol 2.5 mg in PM      Relevant Medications   bisoprolol-hydrochlorothiazide (ZIAC) 2.5-6.25 MG tablet     Other   Bilateral lower extremity edema (Chronic)    He is taking Lasix 3 times a week, and and seems to be pretty well controlled.  I told him that he can take an extra dose if necessary. I suspect that with the HCTZ component on the morning dose of bisoprolol-HCTZ, the edema is little bit better controlled.       ===================================  HPI:    [name] is a 80 y.o. male with a CARDIAC HISTORY below who presents today for 6 month f/u for CAD, Combined Ischemic/LBBB related CM w/ Chronic Combined CHF (NYHA Class II, Stage D).  CAD->CABG 2004-08-06 (Sx = fatigue & PVCs) --> CATH -> MV CAD --> CABG X 4 (RIMA-LAD, LIMA-Diag, SVG-OM, SVG-RCA) 04/2011 - recurrent Sx -> Myoview + for Inf Ischemia --> Cath => Only RIMA-LAD patent --> DES PCI of Native Cx-OM (2 overlapping Promus DES) & Native RCA June 2016 - DES PCI of the dRCA-RPAV. Since then he has done very well with no active anginal symptoms. COMBINED SYSTOLIC/DIASTOLIC CHF / ISCHEMIC-NON-ISCHEMIC CM 2/2 LBBB 12/2017 - LBBB--> ECHO EF ~35% -> Cath 01/2018 w/ patent stents & RIMA-LAD. EF ~35-40%. Normal RHC (PCWP 12 mmHg, LVEDP 14 mmHg) 06/2019 - Echo EF down to 30-35% - referred to Dr. [name] for CRT (P vs. D) -> delayed in order to"OPTIMIZE Med  Rx"  -- attemtped to place on Carvedilol & Entreso (has never been able totoalerate any meds - BB or ARB in the past)  CRT-P (not Defib b/c felt that with CRT, EF could improve) -St Jude BiVPPM => nominal  improvement in E level  Following CRT-P-- he stopped taking Entresto & was converted to Bisoprolol 2.5 mg.   [name] was last seen on October 09, 2020 -> was doing better having switched to bisoprolol, was no longer taking Entresto.  Only using Lasix once or twice a week with relatively well-controlled edema.  Blood pressures tend to be a little on the higher side, but happy for him.  Starting to feel little better but still had some fatigue.  Not doing much.  Though son who lives in [place] passed away in 08-07-2023 of last year after complications from cardiomyopathy, and the other son in [place] was still "not doing well ".=> He denied having any real orthopnea, or PND, still intermittent edema. Upset that he is no real improvement in EF after CRT-P.  However exertional dyspnea did improve.  No chest pain.  No palpitations. Plan was to try to increase bisoprolol to 5 mg daily and try to restart Entresto 1/2 tablet twice daily.  Recent Hospitalizations: None  He was seen by Dr. [name] on 6/24 -> no chest pain or pressure.  No exertional dyspnea.  Able to do activities of daily living without too much difficulty.  Has little discomfort around pacemaker site but otherwise okay.  Nothing to suggest signs or symptoms of volume overload. -> Device was functioning properly.  PVARP was changed because of him experiencing PMT.  Rate responsiveness turned up.    Reviewed  CV studies:    The following studies were reviewed today: (if available, images/films reviewed: From Epic Chart or Care Everywhere) No new studies.   Interval History:   [name] presents here today for routine follow-up doing okay overall.  He actually has been taking his bisoprolol a bit differently.  He has had a little edema so he started taking his wife's bisoprolol HCTZ basically 2.5/6.25 mg in the morning, and the 2.5 mg in the evening as opposed to taking 2.5 mg twice daily.  Doing this, he has done well from a blood  pressure standpoint and the swelling has improved.  He was not able to tolerate while back on Entresto. Uses Lasix maybe 3-4 times a week but since starting HCTZ has not made it as much.  He still denies any chest pain or pressure with rest or exertion, still has some fatigue, but is not noticing symptoms concerning for PND or orthopnea.  Trivial edema controlled with Lasix.  He still disheartened because it is 1 son passing and the other doing poorly.  He thinks this unfair that his sons passed before he does.  He has not had any signs or symptoms of PND orthopnea and denies any arrhythmias.  He denies any PND orthopnea but does have frequent nocturia which limits his rest.  He does say that he is still not very energetic - doing less. Walking = going shopping ~3-4 x / week.  He is still able to go up & down the stairs in his house several times per day without too much dyspnea & no chest pain.   CV Review of Symptoms (Summary) Cardiovascular ROS: positive for - still has DOE/and some exercise intolerance, but nothing significant.  He is not all that active-in fact less active than he had been. negative for - chest pain, orthopnea, paroxysmal nocturnal dyspnea, rapid heart rate, shortness of breath, or syncope/near syncope or TIA/amaurosis fugax, claudication edema seems better controlled as well.  His son is recently seen an echo showed EF of 20%.  He is actually in the process of trying to get into the transplant consideration.  REVIEWED OF SYSTEMS   Review of Systems  Constitutional:  Positive for malaise/fatigue. Negative for weight loss.  HENT:  Positive for congestion. Negative for nosebleeds.   Eyes: Negative.   Respiratory:  Positive for shortness of breath (Per HPI). Negative for cough (Resolved) and hemoptysis.   Cardiovascular:  Negative for chest pain and leg swelling (Well-controlled).  Gastrointestinal:  Negative for blood in stool and constipation.       Still has some  dyspepsia and trouble diarrhea chesting certain foods.  Genitourinary:  Negative for frequency and hematuria.       Unless he has his urinal nearby, he may have some leaking admit difficulty with voiding once he has a commode.  Musculoskeletal:  Positive for joint pain and myalgias.  Neurological:  Positive for dizziness and weakness. Negative for loss of consciousness and headaches.       He is in pretty good shape.  No memory issues.  He has developed a little bit of baseline tremor.  This is better controlled with a slightly higher dose of beta-blocker.  Psychiatric/Behavioral:  Positive for depression (Seems to be quite down mostly because of all the issues going on around him and the fact that he cannot do what he used to do.). Negative for memory loss. The patient does not have insomnia.  And current there is benefit schedule assessment plan 1 unless this little screen is just somewhat hard then with the extreme limb of the hospital when I am at the office -2 screens are somewhat nicer but even that drives her crazy  I have reviewed and (if needed) personally updated the patient's problem list, medications, allergies, past medical and surgical history, social and family history.   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   Frequent PVCs & fatigue -> MYOVIEW w/ inferior /r MV distribution --> CATH: only RIMA-LAD patent (LIMA atretic & SVGs occluded) --> Staged PCI of Native RCA & Cx; followed by PCI-distal RCA   CAD S/P percutaneous coronary angioplasty 8/'12; 06/'16   a) PCI- Cx-OM W/ 2 overlapping Resolute DES 3x15 & 3x18 stents (3.51m),  PCI-RCA -  Promus DES 3x38 (3.21m;; b) 6/'16: [place] 2.75X28  (3.0 mm) -- stents patent in 01/2018;  01/2018 -stents remain patent   CAS (cerebral atherosclerosis)    CAROTID DOPPLER, 08/25/2011 - mildly abnormal   Chronic back pain    Dilated cardiomyopathy ([facility] EF ~30-35%, 01/05/2018   11/2019 - EF ~30%, global HK with Septal dyssynergy --> 6/6/'21 - s/p CRT-P (St. Jude)   Diverticulosis    Erectile dysfunction    Essential hypertension    Medication intolerant -- only able to take minimal doses of medications   Family hx of colon cancer    GERD (gastroesophageal reflux disease)    Gout    Hemorrhoids    Hyperlipidemia with target LDL less than 70    Only able to tolerate very low dose statin - no interest in other options   Kidney stones    [facility] [place]   Polycythemia    S/P CABG x 4 07/2004   Cath for Fatigue & PVCs ==> MV CAD ==> CABG: pRIMA-LAD, pLIMA-LCx, SVG-D1, SVG-rPDA --> ONLY pRIMA-LAD patent   Symptomatic PVCs    As the main symptom for angina   Syncope 2010   ? unclear of etiology    PAST SURGICAL HISTORY -complete history reviewed.   Past Surgical History:  Procedure Laterality Date   CORONARY ARTERY BYPASS GRAFT  07/23/2004   LIMA-Cx RIMA-LAD, SVG-diagonal, SVG-PDA          CARDIAC CATHETERIZATION N/A 02/20/2015   Procedure: Left Heart Cath and Coronary Angiography;  Surgeon: DaLeonie ManMD;  [facility]: Widely patent p-mRCA DES with dRCA 50%-PAVG 80% --> PCI. Patent overlapping stents --mCx-OM. 99% pLAD - patent RIMA-dLAD.  Known CTO SVG-rPL, SVG-OM3, :LIMA-D1.   CORONARY ANGIOPLASTY  WITH STENT PLACEMENT  Is 02/20/2015   PCI-Cx-OM 3 overlapping Resolute DES 3 mm x36m and 3x177m--> 3.64m16mPCI to RCA - Promus Element DES 3x38m93m> 3.264mm76m) dRCA-rPAV: Promus Premier DES 2.764mm 10m mm (3.0 mm)         RIGHT/LEFT HEART CATH AND CORONARY/GRAFT ANGIOGRAPHY N/A 01/09/2018   Procedure: RIGHT/LEFT HEART CATH AND CORONARY/GRAFT ANGIOGRAPHY;  Surgeon: HardinLeonie Man Known occluded  LIMA-Diag & SVG-RCA, SVG-OM.  Mod-severe pLAD that is grafted after D1 - patent RIMA- wraparound LAD, distal 1/2 of PDA territory. Patent Cx-OM DES (ost Cx mild) & p-mRCA overlapping DES & dRCA-RPAV DES.  PCWP 12 mmHg, LVP-EDP: 140/6 - 14 mmH. RAP 2 mmHg. RVP-EDP 33/0 - 5  mmHg        TRANSTHORACIC ECHOCARDIOGRAM  12/2017; 06/2019   (New LBBB)  EF~35% with diffuse HK of septal & lateral walls.  Mildly dilated LV.  "GR 1 "DD with high filling pressures. ? w/ normal LA size; 10/20: EF 30-35%, LBBB related Septal dyskinesis, GR2 DD (but normal LA size).    TRANSTHORACIC ECHOCARDIOGRAM  09/14/2020   (s/p CRT-P) EF estimated 30%. Moderately decreased function with global HK. GR 1 DD. Mildly reduced RV function. Mild aortic valve thickening/sclerosis but no stenosis. (Compared to prior study, no notable change in fxn, but decreased LV r dilation with slight decrease in RV function).      BIV PACEMAKER INSERTION CRT-P                                                                               02/10/2020   Procedure: BIV PACEMAKER INSERTION CRT-P;  Surgeon: CamnitConstance Haw [facility];  Service: Cardiovascular;  Laterality: N/A;    RLCP 01/2018:  LIMA-Diag, SVG-RCA, SVG-OM known CTO.  Patent RIMA-LAD (severe native LAD disease prior to D1 & D2 both with extensive disease) - retrograde fills to CTO &antegrade wrap-around LAD (distal 1/2 of PDA).  DES in LCx-OM & p-m RCA & dRCA-RPAV widely patent with patent RPDA.  Normal RHC #s: PCWP / LVEDP ~12-14 mmHg. RAP & RVEDP ~0-2 mmHg.   Immunization History  Administered Date(s) Administered   Influenza Whole 06/19/2010, 06/06/2011, 06/08/2012   Pneumococcal Polysaccharide-23 06/07/2007    MEDICATIONS/ALLERGIES   Current Meds  Medication Sig   ALPRAZolam (XANAX) 0.5 MG tablet Take 0.5 mg by mouth 2 (two) times daily as needed for anxiety.    aspirin EC 81 MG tablet Take 81 mg by mouth daily.    bisoprolol (ZEBETA) 5 MG tablet  Take 1 tablet (5 mg total) by mouth in the morning and at bedtime. Or as directed   bisoprolol-hydrochlorothiazide (ZIAC) 2.5-6.25 MG tablet Take 1 tablet by mouth daily.   chlorzoxazone (PARAFON) 500 MG tablet Take 500 mg by mouth daily as needed for muscle spasms.   nystatin cream (MYCOSTATIN) Apply 1 application topically daily as needed (rash).    nystatin-triamcinolone (MYCOLOG II) cream Apply 1 application topically daily as needed (yeast infections).   Polyethyl Glycol-Propyl Glycol (SYSTANE OP) Place 1 drop into both eyes 2 (two) times daily.   pravastatin (PRAVACHOL) 10 MG tablet Take 10 mg by mouth daily.  SYMBICORT 160-4.5 MCG/ACT inhaler SMARTSIG:2 Puff(s) By Mouth Twice Daily   triamcinolone cream (KENALOG) 0.1 % Apply 1 application topically daily as needed (irritation).     Allergies  Allergen Reactions   Meperidine Hcl     Cold sweats, dizziness    Penicillins     Severe headaches Has patient had a PCN reaction causing immediate rash, facial/tongue/throat swelling, SOB or lightheadedness with hypotension: No Has patient had a PCN reaction causing severe rash involving mucus membranes or skin necrosis: No Has patient had a PCN reaction that required hospitalization: No Has patient had a PCN reaction occurring within the last 10 years: No If all of the above answers are "NO", then may proceed with Cephalosporin use.     SOCIAL HISTORY/FAMILY HISTORY   Reviewed in Epic:  Pertinent findings:  Social History   Tobacco Use   Smoking status: Former    Packs/day: 1.00    Years: 7.00    Pack years: 7.00    Types: Cigarettes    Quit date: 09/06/1963    Years since quitting: 57.7   Smokeless tobacco: Never  Substance Use Topics   Alcohol use: No   Drug use: No   Social History   Social History Narrative   He is made to patient of mine. He works for [employer], as a Engineer, manufacturing systems. He is a former smoker but quit in 1965 after 7 years. He does not drink  alcohol.   He is very busy with work, and does not get routine exercise. He plans to work maybe one more year and then will retire. 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    OBJCTIVE -PE, EKG, labs   Wt Readings from Last 3 Encounters:  05/17/21 180 lb (81.6 kg)  02/26/21 182 lb (82.6 kg)  10/09/20 189 lb 6.4 oz (85.9 kg)    Physical Exam: BP (!) 124/56 (BP Location: Left Arm, Patient Position: Sitting, Cuff Size: Normal)   Pulse 62   Ht '5\' 8"'$  (1.727 m)   Wt 180 lb (81.6 kg)   BMI 27.37 kg/m  Physical Exam Vitals reviewed.  Constitutional:      General: He is not in acute distress.    Appearance: Normal appearance. He is normal weight. He is not ill-appearing or toxic-appearing.     Comments: Healthy-appearing.  Well-groomed.  HENT:     Head: Normocephalic and atraumatic.  Neck:     Vascular: No carotid bruit or JVD.  Cardiovascular:     Rate and Rhythm: Normal rate and regular rhythm. Occasional Extrasystoles are present.    Chest Wall: PMI is not displaced.     Pulses: Normal pulses.     Heart sounds: S1 normal and S2 normal. Heart sounds are distant. Murmur heard.  Harsh crescendo-decrescendo early systolic murmur is present with a grade of 1/6 at the upper right sternal border radiating to the neck.    No friction rub. Gallop (Versus split S2) present. S4 sounds present.  Musculoskeletal:        General: Swelling (Trivial) present. Normal range of motion.     Cervical back: Normal range of motion and neck supple.  Skin:    General: Skin is warm and dry.  Neurological:     General: No focal deficit present.     Mental Status: He is alert and oriented to person, place, and time.     Cranial Nerves: No cranial  nerve deficit.     Gait: Gait abnormal.     Comments: Mild pill-rolling tremor  Psychiatric:        Mood and Affect: Mood normal.        Behavior: Behavior normal.        Thought  Content: Thought content normal.        Judgment: Judgment normal.    Adult ECG Report Not checked  Recent Labs:   01/14/2021: TC 204, HDL 49, LDL 140, TG 85 -> although he acknowledges that his lipids are poorly controlled, he is not interested in trying new medicines.  Lab Results  Component Value Date   CREATININE 0.86 01/30/2020   BUN 12 01/30/2020   NA 140 01/30/2020   K 5.1 01/30/2020   CL 102 01/30/2020   CO2 25 01/30/2020   CBC Latest Ref Rng & Units 01/30/2020 01/05/2018 02/21/2015  WBC 3.4 - 10.8 x10E3/uL 9.7 9.8 8.8  Hemoglobin 13.0 - 17.7 g/dL 15.2 16.4 14.3  Hematocrit 37.5 - 51.0 % 45.4 48.3 42.5  Platelets 150 - 450 x10E3/uL 269 242 239    No results found for: HGBA1C No results found for: TSH  ==================================================  COVID-19 Education: The signs and symptoms of COVID-19 were discussed with the patient and how to seek care for testing (follow up with PCP or arrange E-visit).    I spent a total of 34 minutes with the patient spent in direct patient consultation.  Additional time spent with chart review  / charting (studies, outside notes, etc): 16 min Total Time: 50 min  Current medicines are reviewed at length with the patient today.  (+/- concerns) n/a  This visit occurred during the SARS-CoV-2 public health emergency.  Safety protocols were in place, including screening questions prior to the visit, additional usage of staff PPE, and extensive cleaning of exam room while observing appropriate contact time as indicated for disinfecting solutions.  Notice: This dictation was prepared with Dragon dictation along with smaller phrase technology. Any transcriptional errors that result from this process are unintentional and may not be corrected upon review.  Patient Instructions / Medication Changes & Studies & Tests Ordered   Patient Instructions  Medication Instructions:  START Bisoprolol-Hydrochlorothiazide 2.5-6.25 mg once daily in  the morning Take Bisoprolol 5 mg in the evening  *If you need a refill on your cardiac medications before your next appointment, please call your pharmacy*   Lab Work: None ordered If you have labs (blood work) drawn today and your tests are completely normal, you will receive your results only by: MyChart Message (if you have MyChart) OR A paper copy in the mail If you have any lab test that is abnormal or we need to change your treatment, we will call you to review the results.   Testing/Procedures: None ordered   Follow-Up: At [facility], you and your health needs are our priority.  As part of our continuing mission to provide you with exceptional heart care, we have created designated Provider Care Teams.  These Care Teams include your primary Cardiologist (physician) and Advanced Practice Providers (APPs -  Physician Assistants and Nurse Practitioners) who all work together to provide you with the care you need, when you need it.  We recommend signing up for the patient portal called "MyChart".  Sign up information is provided on this After Visit Summary.  MyChart is used to connect with patients for Virtual Visits (Telemedicine).  Patients are able to view lab/test results, encounter notes, upcoming appointments,  etc.  Non-urgent messages can be sent to your provider as well.   To learn more about what you can do with MyChart, go to [email or web address].    Your next appointment:   6 month(s)  The format for your next appointment:   In Person  Provider:   You may see [name], MD or one of the following Advanced Practice Providers on your designated Care Team:   Rosaria Ferries, PA-C Caron Presume, PA-C   Studies Ordered:   No orders of the defined types were placed in this encounter.    [name], M.D., M.S. Interventional Cardiologist   Pager # [phone] Phone # [phone] [address] [place], [place] [address]   Thank you  for choosing Heartcare at [facility]!!

## 2021-05-20 NOTE — Progress Notes (Signed)
Remote pacemaker transmission.   

## 2021-06-05 ENCOUNTER — Encounter: Payer: Self-pay | Admitting: Cardiology

## 2021-06-05 NOTE — Assessment & Plan Note (Addendum)
EF did not improve with CRT-P.  But he does feel little bit better.  He is tolerating the higher dose of beta-blocker and I am hoping that over time he may be on titrating further.  Right now he is taking total of 2.5 mg twice daily bisoprolol with 1 dose having a little bit of HCTZ which keeps him from MD uses less Lasix.  If I was not worried about his renal function, could consider digoxin, but with his advanced age, will prefer to avoid it.  Did not do well on spironolactone in the past nor a ARB.  Could not tolerated Entresto.  Will consider Wilder Glade or Jardiance next visit -> have been reluctant because of his urologic issues.[name]

## 2021-06-05 NOTE — Assessment & Plan Note (Signed)
Doing better with Bisoprolol - will change to: Bisoprolol- HCTC 2.5-6.125 in AM & Bisoprolol 2.5 mg in PM

## 2021-06-05 NOTE — Assessment & Plan Note (Signed)
NYHA class II symptoms with well-controlled edema on low-dose Lasix with HCTZ portion of bisoprolol.  Tolerating increased dose of beta-blocker with the 1 dose having HCTZ, essentially taking 2.5 mg twice daily bisoprolol.  Did not tolerate Entresto or even low-dose ARB.  Has not tolerated spironolactone, and I am reluctant of using digoxin given his advanced age and propensity for renal insufficiency.  As long as he is feeling relatively stable, no changes.

## 2021-06-05 NOTE — Assessment & Plan Note (Signed)
He is taking Lasix 3 times a week, and and seems to be pretty well controlled.  I told him that he can take an extra dose if necessary. I suspect that with the HCTZ component on the morning dose of bisoprolol-HCTZ, the edema is little bit better controlled.

## 2021-06-05 NOTE — Assessment & Plan Note (Signed)
Pretty much all but 1 graft occluded.  The remainder of his non-LAD vessels are all stented.  The circumflex and RCA as well as distal RCA vessels have stents that are patent based on last cath.  In the absence of active symptoms, would not investigate further at this time.  Plan: Continue aspirin, beta-blocker and minimal dose statin.  This is the much as he can tolerate  Continue to encourage activity and exercise. He is not on Plavix having had issues with bleeding.  Only on aspirin.

## 2021-06-05 NOTE — Assessment & Plan Note (Addendum)
We talked about it very briefly, he is not interested in doing any additional medical therapy for now.  He understands that there is risk, but he does not want to take any risk taking the medications even if it is a PCSK9 inhibitor or inclisiran.  He is agreeable to continue current dose of pravastatin, but not did not tolerate even higher dose at 20 mg.

## 2021-06-05 NOTE — Assessment & Plan Note (Signed)
Actually seems to doing better with higher blood pressures, he did not tolerate Entresto.  He is doing okay with bisoprolol.  We reviewed how it adjusting based on what he has been able tolerate. Bisoprolol- HCTC 2.5-6.125 in AM & Bisoprolol 2.5 mg in PM He has never been on tolerating the medications, so I would asked that we do not try to put him back on Entresto or other medications because this would not work.  He is simply on a low-dose Lasix that he takes as needed plus these 2 medications.

## 2021-06-17 DIAGNOSIS — Z23 Encounter for immunization: Secondary | ICD-10-CM | POA: Diagnosis not present

## 2021-06-25 DIAGNOSIS — Z23 Encounter for immunization: Secondary | ICD-10-CM | POA: Diagnosis not present

## 2021-08-02 DIAGNOSIS — I10 Essential (primary) hypertension: Secondary | ICD-10-CM | POA: Diagnosis not present

## 2021-08-02 DIAGNOSIS — R7301 Impaired fasting glucose: Secondary | ICD-10-CM | POA: Diagnosis not present

## 2021-08-04 DIAGNOSIS — I10 Essential (primary) hypertension: Secondary | ICD-10-CM | POA: Diagnosis not present

## 2021-08-06 DIAGNOSIS — E782 Mixed hyperlipidemia: Secondary | ICD-10-CM | POA: Diagnosis not present

## 2021-08-06 DIAGNOSIS — M545 Low back pain, unspecified: Secondary | ICD-10-CM | POA: Diagnosis not present

## 2021-08-06 DIAGNOSIS — I10 Essential (primary) hypertension: Secondary | ICD-10-CM | POA: Diagnosis not present

## 2021-08-06 DIAGNOSIS — G250 Essential tremor: Secondary | ICD-10-CM | POA: Diagnosis not present

## 2021-08-06 DIAGNOSIS — E875 Hyperkalemia: Secondary | ICD-10-CM | POA: Diagnosis not present

## 2021-08-06 DIAGNOSIS — J4540 Moderate persistent asthma, uncomplicated: Secondary | ICD-10-CM | POA: Diagnosis not present

## 2021-08-06 DIAGNOSIS — Z0001 Encounter for general adult medical examination with abnormal findings: Secondary | ICD-10-CM | POA: Diagnosis not present

## 2021-08-06 DIAGNOSIS — R7301 Impaired fasting glucose: Secondary | ICD-10-CM | POA: Diagnosis not present

## 2021-08-06 DIAGNOSIS — I420 Dilated cardiomyopathy: Secondary | ICD-10-CM | POA: Diagnosis not present

## 2021-08-06 DIAGNOSIS — R978 Other abnormal tumor markers: Secondary | ICD-10-CM | POA: Diagnosis not present

## 2021-08-06 DIAGNOSIS — E291 Testicular hypofunction: Secondary | ICD-10-CM | POA: Diagnosis not present

## 2021-08-06 DIAGNOSIS — Z86010 Personal history of colonic polyps: Secondary | ICD-10-CM | POA: Diagnosis not present

## 2021-08-11 ENCOUNTER — Ambulatory Visit (INDEPENDENT_AMBULATORY_CARE_PROVIDER_SITE_OTHER): Payer: Medicare Other

## 2021-08-11 DIAGNOSIS — I420 Dilated cardiomyopathy: Secondary | ICD-10-CM | POA: Diagnosis not present

## 2021-08-11 LAB — CUP PACEART REMOTE DEVICE CHECK
Battery Remaining Longevity: 76 mo
Battery Remaining Percentage: 81 %
Battery Voltage: 2.99 V
Brady Statistic AP VP Percent: 92 %
Brady Statistic AP VS Percent: 1 %
Brady Statistic AS VP Percent: 6.1 %
Brady Statistic AS VS Percent: 1 %
Brady Statistic RA Percent Paced: 91 %
Date Time Interrogation Session: 20221207033427
Implantable Lead Implant Date: 20210607
Implantable Lead Implant Date: 20210607
Implantable Lead Implant Date: 20210607
Implantable Lead Location: 753858
Implantable Lead Location: 753859
Implantable Lead Location: 753860
Implantable Pulse Generator Implant Date: 20210607
Lead Channel Impedance Value: 450 Ohm
Lead Channel Impedance Value: 460 Ohm
Lead Channel Impedance Value: 760 Ohm
Lead Channel Pacing Threshold Amplitude: 0.5 V
Lead Channel Pacing Threshold Amplitude: 0.75 V
Lead Channel Pacing Threshold Amplitude: 0.875 V
Lead Channel Pacing Threshold Pulse Width: 0.5 ms
Lead Channel Pacing Threshold Pulse Width: 0.5 ms
Lead Channel Pacing Threshold Pulse Width: 0.5 ms
Lead Channel Sensing Intrinsic Amplitude: 2.8 mV
Lead Channel Sensing Intrinsic Amplitude: 8.3 mV
Lead Channel Setting Pacing Amplitude: 2 V
Lead Channel Setting Pacing Amplitude: 2 V
Lead Channel Setting Pacing Amplitude: 2 V
Lead Channel Setting Pacing Pulse Width: 0.5 ms
Lead Channel Setting Pacing Pulse Width: 0.5 ms
Lead Channel Setting Sensing Sensitivity: 2 mV
Pulse Gen Model: 3562
Pulse Gen Serial Number: 3818273

## 2021-08-18 ENCOUNTER — Telehealth: Payer: Self-pay

## 2021-08-18 NOTE — Telephone Encounter (Signed)
Successful telephone encounter to patient to discuss meaning of "AMS" and "PAT" noted on MyChart Remote report. Provided explanation and reassurance that device is functioning normally and PAT was only 8 seconds back in November. Patient appreciative of explanation. He was concerned secondary to having "something happen" around that same time that caused him to have intermittent confusion that resolved over 3 days. Patient did not call 911 or seek medical care. Strongly advised patient to seek medical care if there is another change in his medical conditions at this could be the result of a stroke, OTC medications, or numerous other causes. Patient appreciative of education and information. Will continue to follow remote transmissions.

## 2021-08-18 NOTE — Telephone Encounter (Signed)
Patient called in and wants to know if a nurse can call and go over last transmission since he cannot understand it from [employer]

## 2021-08-20 NOTE — Progress Notes (Signed)
Remote pacemaker transmission.   

## 2021-10-06 ENCOUNTER — Telehealth: Payer: Self-pay | Admitting: *Deleted

## 2021-10-06 DIAGNOSIS — E875 Hyperkalemia: Secondary | ICD-10-CM

## 2021-10-06 DIAGNOSIS — R899 Unspecified abnormal finding in specimens from other organs, systems and tissues: Secondary | ICD-10-CM

## 2021-10-06 NOTE — Telephone Encounter (Signed)
Spoke to patient .  Result given . Patient states primary Dr Nevada Crane  did not recheck - lab value for potassium.  Patient aware will  have BMP  rechecked sometime prior to next office visit which is 12/06/21 at 3 pm.  Bmp ordered.

## 2021-10-06 NOTE — Telephone Encounter (Signed)
-----   Message from Leonie Man, MD sent at 09/24/2021  1:39 AM EST ----- 08/04/2021 Na+ 143, K+ 5.6, Cl- 102, HCO3- 26 , BUN 14, Cr 0.96, Glu 96, Ca2+ 9.4; AST 23, ALT 20, AlkP 83 CBC: W 10.5, H/H 15.7/48.5, Plt 267 TC 165, TG 105, HDL 46, LDL 100; A1c 6.0  Potassium level is high.  I would hope that PCP has addressed this already since labs were checked in the end of November.  Would like to see a follow-up potassium level.   Cholesterol levels but is stable with an LDL of 100.  Goal is less than 70, but probably cannot be treated with current medication regimen.  Would need to be more aggressive.  Can discuss follow-up.  [name], MD

## 2021-10-11 DIAGNOSIS — E875 Hyperkalemia: Secondary | ICD-10-CM | POA: Diagnosis not present

## 2021-10-11 DIAGNOSIS — R899 Unspecified abnormal finding in specimens from other organs, systems and tissues: Secondary | ICD-10-CM | POA: Diagnosis not present

## 2021-10-12 LAB — BASIC METABOLIC PANEL
BUN/Creatinine Ratio: 21 (ref 10–24)
BUN: 21 mg/dL (ref 8–27)
CO2: 26 mmol/L (ref 20–29)
Calcium: 9.3 mg/dL (ref 8.6–10.2)
Chloride: 99 mmol/L (ref 96–106)
Creatinine, Ser: 1.01 mg/dL (ref 0.76–1.27)
Glucose: 103 mg/dL — ABNORMAL HIGH (ref 70–99)
Potassium: 5.4 mmol/L — ABNORMAL HIGH (ref 3.5–5.2)
Sodium: 139 mmol/L (ref 134–144)
eGFR: 75 mL/min/{1.73_m2} (ref >59)

## 2021-10-20 ENCOUNTER — Telehealth: Payer: Self-pay | Admitting: Cardiology

## 2021-10-20 DIAGNOSIS — R899 Unspecified abnormal finding in specimens from other organs, systems and tissues: Secondary | ICD-10-CM

## 2021-10-20 DIAGNOSIS — E875 Hyperkalemia: Secondary | ICD-10-CM

## 2021-10-20 NOTE — Telephone Encounter (Signed)
Spoke to the patient. He stated that he was told last Friday to get repeat lab work done due to an elevated potassium. He went to [facility] but they did not have any orders.

## 2021-10-20 NOTE — Telephone Encounter (Signed)
New Message:      Patient said he went to [facility] in [place] and they said they did not have an order.r

## 2021-10-22 ENCOUNTER — Other Ambulatory Visit: Payer: Self-pay | Admitting: *Deleted

## 2021-10-22 DIAGNOSIS — E875 Hyperkalemia: Secondary | ICD-10-CM

## 2021-10-22 DIAGNOSIS — R899 Unspecified abnormal finding in specimens from other organs, systems and tissues: Secondary | ICD-10-CM

## 2021-10-22 NOTE — Telephone Encounter (Signed)
Left a message, per the dpr, that a BMET has been ordered and he can have it done at any [facility] or the office.

## 2021-10-23 LAB — BASIC METABOLIC PANEL
BUN/Creatinine Ratio: 16 (ref 10–24)
BUN: 16 mg/dL (ref 8–27)
CO2: 26 mmol/L (ref 20–29)
Calcium: 9.1 mg/dL (ref 8.6–10.2)
Chloride: 101 mmol/L (ref 96–106)
Creatinine, Ser: 0.99 mg/dL (ref 0.76–1.27)
Glucose: 93 mg/dL (ref 70–99)
Potassium: 4.6 mmol/L (ref 3.5–5.2)
Sodium: 140 mmol/L (ref 134–144)
eGFR: 77 mL/min/{1.73_m2} (ref >59)

## 2021-10-27 IMAGING — CR DG CHEST 2V
2 series · 2 of 2 positions shown · non-contrast
Comparison: Chest x-ray dated December 06, 2013.

CLINICAL DATA: Pacemaker placement.

EXAM:
CHEST - 2 VIEW

[w chest pa]
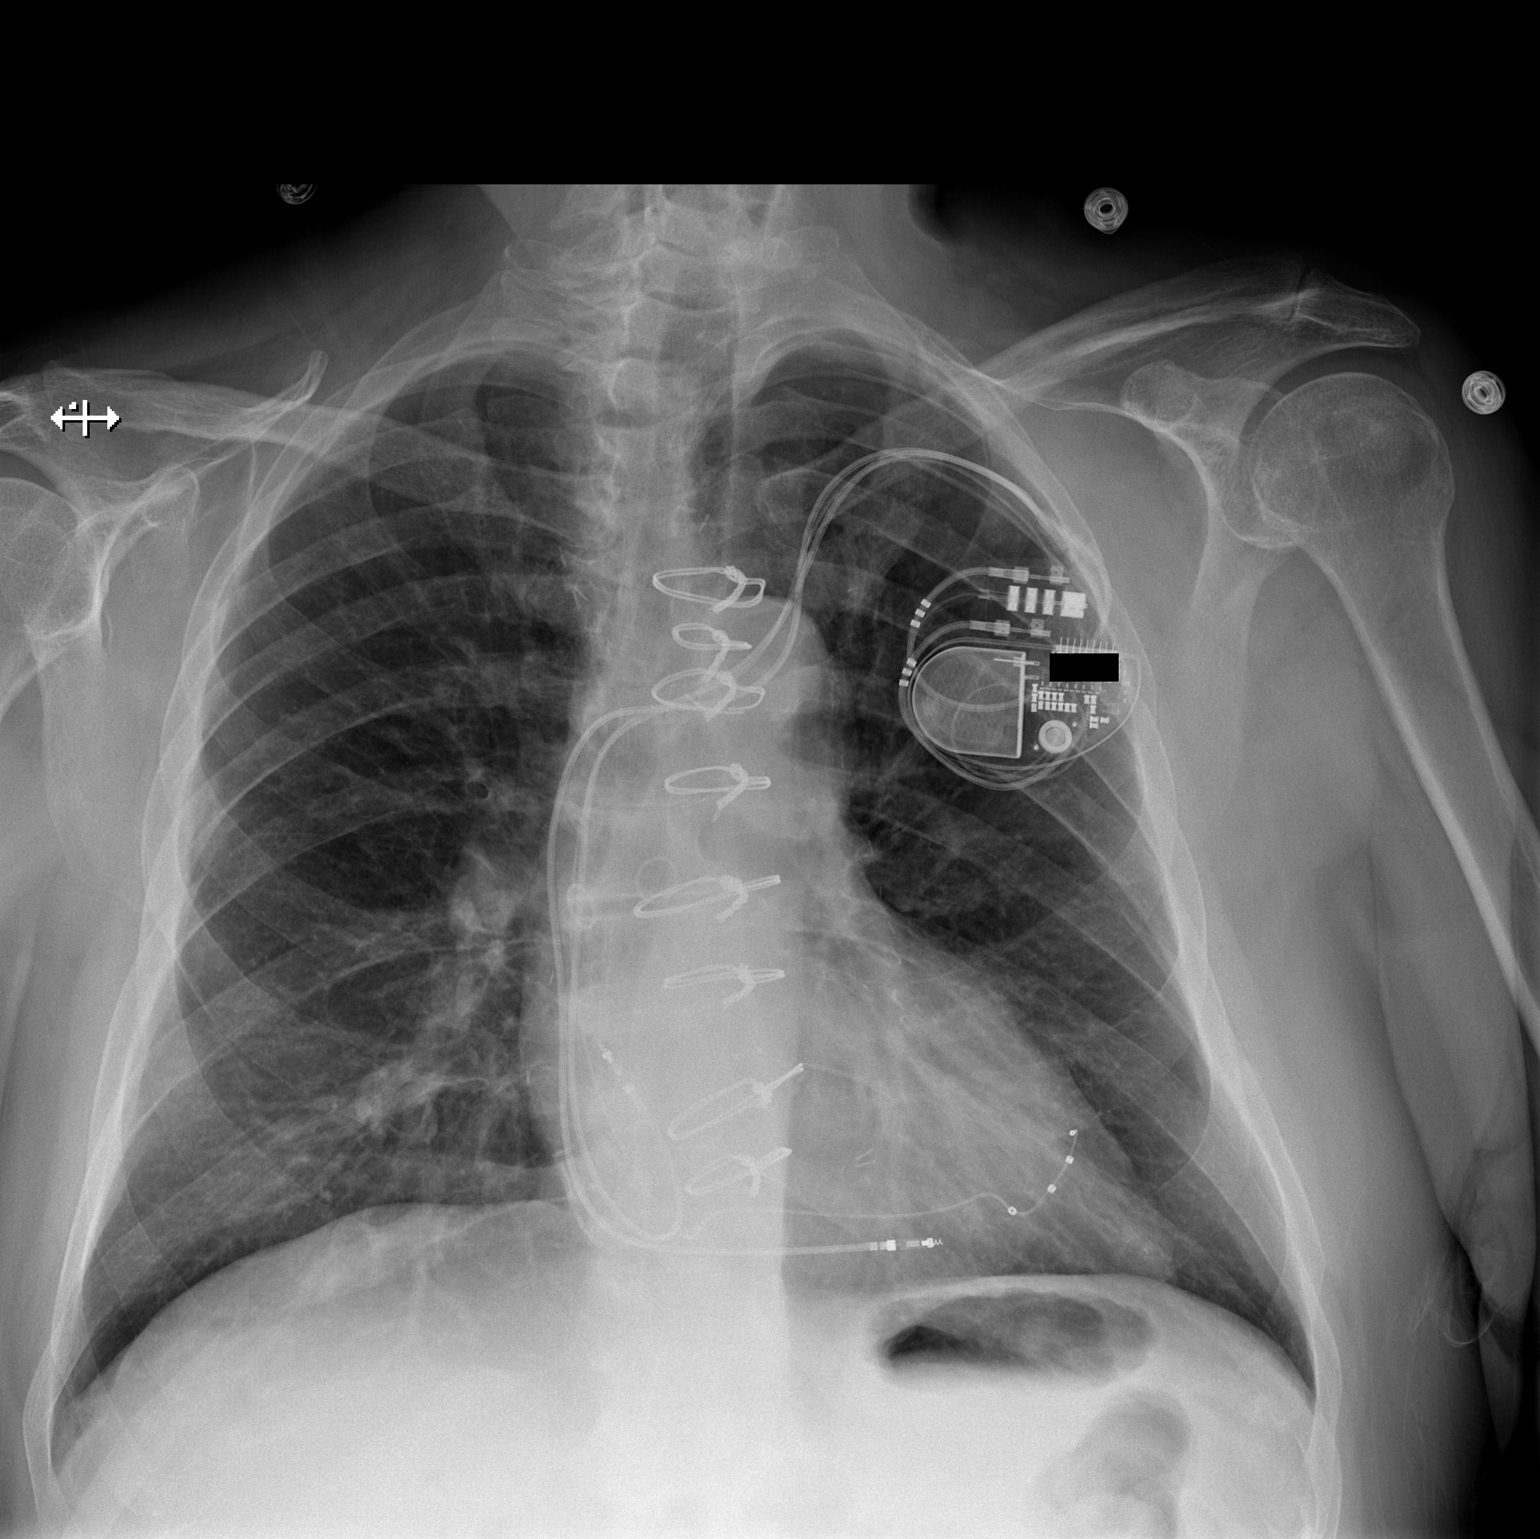

[w chest lat]
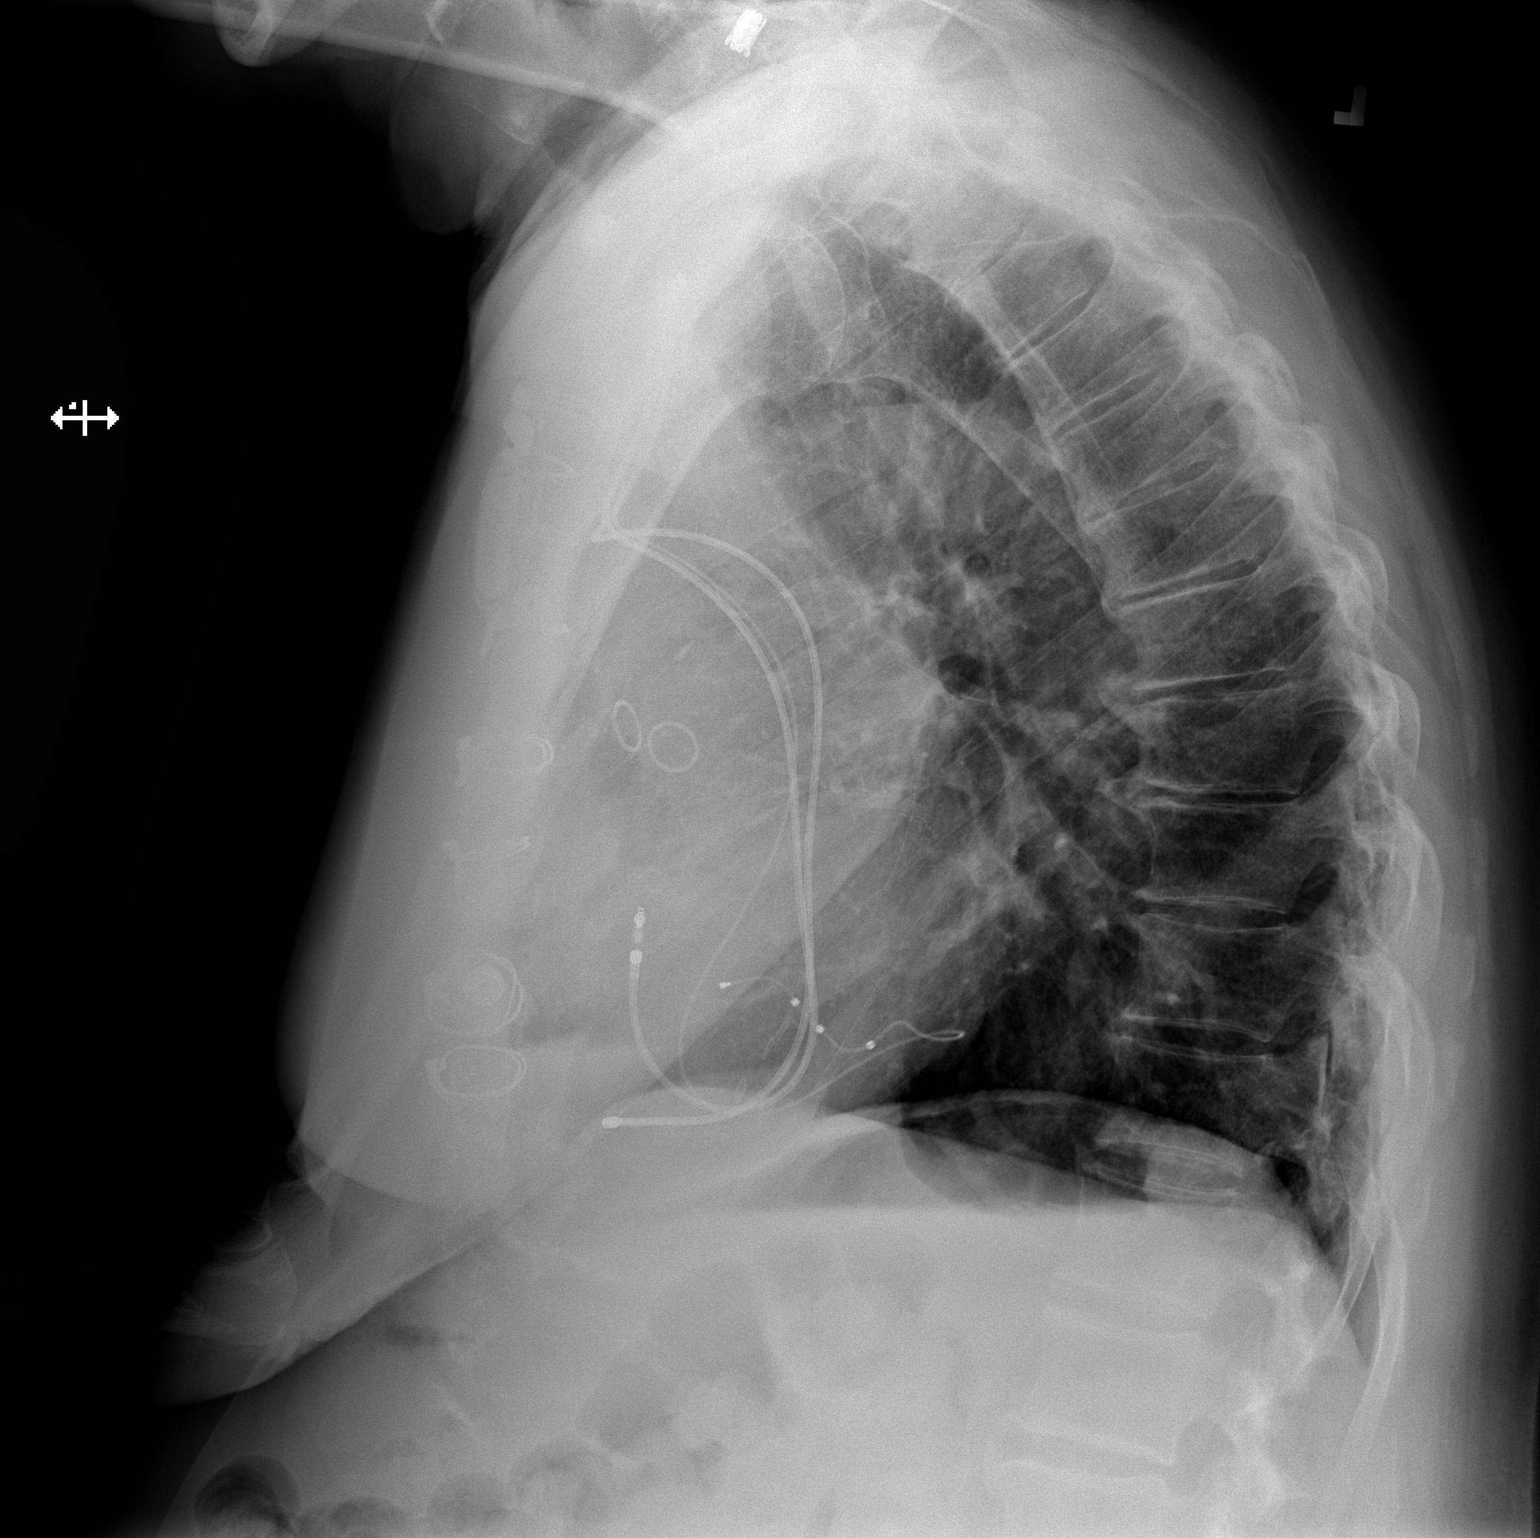

[2 of 2 positions shown; findings below may reference images not displayed]

FINDINGS: New left chest wall pacemaker with leads terminating in the right
atrium, right ventricle, and coronary sinus. The heart size and
mediastinal contours are within normal limits. Prior CABG. Normal
pulmonary vascularity. No focal consolidation, pleural effusion, or
pneumothorax. No acute osseous abnormality.
IMPRESSION: 1. New left chest wall pacemaker without complication.

## 2021-11-10 ENCOUNTER — Ambulatory Visit (INDEPENDENT_AMBULATORY_CARE_PROVIDER_SITE_OTHER): Payer: Medicare Other

## 2021-11-10 DIAGNOSIS — I5042 Chronic combined systolic (congestive) and diastolic (congestive) heart failure: Secondary | ICD-10-CM

## 2021-11-10 LAB — CUP PACEART REMOTE DEVICE CHECK
Battery Remaining Longevity: 72 mo
Battery Remaining Percentage: 77 %
Battery Voltage: 2.99 V
Brady Statistic AP VP Percent: 93 %
Brady Statistic AP VS Percent: 1 %
Brady Statistic AS VP Percent: 5.6 %
Brady Statistic AS VS Percent: 1 %
Brady Statistic RA Percent Paced: 92 %
Date Time Interrogation Session: 20230308020009
Implantable Lead Implant Date: 20210607
Implantable Lead Implant Date: 20210607
Implantable Lead Implant Date: 20210607
Implantable Lead Location: 753858
Implantable Lead Location: 753859
Implantable Lead Location: 753860
Implantable Pulse Generator Implant Date: 20210607
Lead Channel Impedance Value: 440 Ohm
Lead Channel Impedance Value: 460 Ohm
Lead Channel Impedance Value: 750 Ohm
Lead Channel Pacing Threshold Amplitude: 0.625 V
Lead Channel Pacing Threshold Amplitude: 0.75 V
Lead Channel Pacing Threshold Amplitude: 1 V
Lead Channel Pacing Threshold Pulse Width: 0.5 ms
Lead Channel Pacing Threshold Pulse Width: 0.5 ms
Lead Channel Pacing Threshold Pulse Width: 0.5 ms
Lead Channel Sensing Intrinsic Amplitude: 3.1 mV
Lead Channel Sensing Intrinsic Amplitude: 9.2 mV
Lead Channel Setting Pacing Amplitude: 2 V
Lead Channel Setting Pacing Amplitude: 2 V
Lead Channel Setting Pacing Amplitude: 2 V
Lead Channel Setting Pacing Pulse Width: 0.5 ms
Lead Channel Setting Pacing Pulse Width: 0.5 ms
Lead Channel Setting Sensing Sensitivity: 2 mV
Pulse Gen Model: 3562
Pulse Gen Serial Number: 3818273

## 2021-11-23 NOTE — Progress Notes (Signed)
Remote pacemaker transmission.   

## 2021-12-06 ENCOUNTER — Ambulatory Visit (INDEPENDENT_AMBULATORY_CARE_PROVIDER_SITE_OTHER): Payer: Medicare Other | Admitting: Cardiology

## 2021-12-06 ENCOUNTER — Encounter: Payer: Self-pay | Admitting: Cardiology

## 2021-12-06 VITALS — BP 150/80 | HR 61 | Ht 68.0 in | Wt 194.4 lb

## 2021-12-06 DIAGNOSIS — I493 Ventricular premature depolarization: Secondary | ICD-10-CM | POA: Diagnosis not present

## 2021-12-06 DIAGNOSIS — I10 Essential (primary) hypertension: Secondary | ICD-10-CM | POA: Diagnosis not present

## 2021-12-06 DIAGNOSIS — M791 Myalgia, unspecified site: Secondary | ICD-10-CM | POA: Diagnosis not present

## 2021-12-06 DIAGNOSIS — H832X9 Labyrinthine dysfunction, unspecified ear: Secondary | ICD-10-CM

## 2021-12-06 DIAGNOSIS — Z9861 Coronary angioplasty status: Secondary | ICD-10-CM | POA: Diagnosis not present

## 2021-12-06 DIAGNOSIS — G4733 Obstructive sleep apnea (adult) (pediatric): Secondary | ICD-10-CM

## 2021-12-06 DIAGNOSIS — I5042 Chronic combined systolic (congestive) and diastolic (congestive) heart failure: Secondary | ICD-10-CM

## 2021-12-06 DIAGNOSIS — E785 Hyperlipidemia, unspecified: Secondary | ICD-10-CM

## 2021-12-06 DIAGNOSIS — R600 Localized edema: Secondary | ICD-10-CM

## 2021-12-06 DIAGNOSIS — T466X5A Adverse effect of antihyperlipidemic and antiarteriosclerotic drugs, initial encounter: Secondary | ICD-10-CM

## 2021-12-06 DIAGNOSIS — I420 Dilated cardiomyopathy: Secondary | ICD-10-CM

## 2021-12-06 DIAGNOSIS — I2510 Atherosclerotic heart disease of native coronary artery without angina pectoris: Secondary | ICD-10-CM | POA: Diagnosis not present

## 2021-12-06 DIAGNOSIS — I25708 Atherosclerosis of coronary artery bypass graft(s), unspecified, with other forms of angina pectoris: Secondary | ICD-10-CM | POA: Diagnosis not present

## 2021-12-06 DIAGNOSIS — I951 Orthostatic hypotension: Secondary | ICD-10-CM | POA: Diagnosis not present

## 2021-12-06 NOTE — Progress Notes (Signed)
? ? ? ?Primary Care Provider: [name], MD ?Cardiologist: [name], MD ?Electrophysiologist: [name], MD ? ?Clinic Note: ?Chief Complaint  ?Patient presents with  ? Follow-up  ?  Stable.  Doing well.  ? Coronary Artery Disease  ?  No angina  ? Congestive Heart Failure  ?  Stable.  No active symptoms.  ? ? ?=================================== ? ?ASSESSMENT/PLAN  ? ?Problem List Items Addressed This Visit   ? ?  ? Cardiology Problems  ? CAD (coronary artery disease) of artery bypass graft -atretic LIMA- circumflex, occluded SVG-RCA and SVG-diagonal. (Chronic)  ? Relevant Orders  ? EKG 12-Lead (Completed)  ? CAD S/P  PCI of Cx-OM w/ 2 Resolute DES 3.0 x 15 & 3.0 x 18 (3.21m); PCI-pRCA: Promus Element DES 3.0 x 38 (3.274m; dRCA-rPAV: Promus Premier DES 2.75x28 (3 mm) (Chronic)  ?  All the one graft was occluded (RIMA-LAD) now all remaining coronary arteries have been stented (LCx, RCA and rPAV). ? ?No active anginal symptoms.  He initially notes having frequent PVCs as an anginal equivalent.  He is not feeling any PVCs since we titrated beta-blocker dose. ? ?Plan: Continue aspirin, beta-blocker. ? ?Intolerant of statins-not willing to take additional medications. ?No ARB-unable to tolerate lower blood pressures. ?  ?  ? Relevant Orders  ? EKG 12-Lead (Completed)  ? Dilated cardiomyopathy ([place] EF ~35%, new LBBB - no change to Coronary [place]at[place] on Cath (Chronic)  ?  Unfortunately, his EF did not improve with CRT-P, and we have not really been able to titrate guideline directed medical therapy because of his intolerances to everything.  Despite this, symptomatically CRT-P has improved his symptoms of dyspnea and fatigue.  Edema is well controlled with no PND orthopnea. ?  ?  ? Relevant Orders  ? EKG 12-Lead (Completed)  ? 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  NYHA IIB Symptoms, stable.  Edema well controlled with HCTZ and low-dose intermittent Lasix.  He  says that his energy level of dyspnea has definitely improved since he is paced.  He was upset to see that his EF did not improve, but he does state that the patient has improved his energy level and decrease his dyspnea. ? ?Only tolerating bisoprolol with minimal dose of HCTZ.  Otherwise has not tolerated ARB or Entresto.  I am very reluctant to even consider SGLT2 inhibitor, basically the fact that he pretty much to talk despite everything he takes.. ?  ?  ? Relevant Orders  ? EKG 12-Lead (Completed)  ? Hyperlipidemia with target LDL less than 70 (Chronic)  ?  He did not tolerate even half tablet (10 mg) p.o. pravastatin every other day. ? ?At this point, distal somewhat try other medications.  Is not really interested in trying injection medications. ?  ?  ? Essential hypertension (Chronic)  ?  Target range for him for systolic pressures in September 2 100 2060.  He becomes symptomatic if we try to push his medicines further anyway.  Therefore we will simply continue current doses of bisoprolol and bisoprolol-HCTZ.  We have tried ARB as well as Entresto but he has not tolerated.  He also did not tolerate spironolactone. ?  ?  ? PVC's (premature ventricular contractions) (Chronic)  ?  He says he really is not feeling PVCs anymore since taking the 2.5 mg bisoprolol twice daily. ?  ?  ? Orthostatic hypotension - with tremor (Chronic)  ?  Pretty much not tolerant  of any type of blood pressure medications.  His systolic blood pressure is usually in the 120s-150s at home. ? ?At this point I think he is well within target range for his blood pressure recordings.  With his easy fatigue and orthostatic type symptoms, would not pressure medications any further.  In fact he probably would not take them regardless. ?  ?  ?  ? Other  ? Bilateral lower extremity edema (Chronic)  ?  Well-controlled with his PRN Lasix and low-dose HCTZ. ?  ?  ? Vestibular disequilibrium  ? Myalgia due to statin (Chronic)  ?  He has not tried  atorvastatin, rosuvastatin and pravastatin as well as distant history of simvastatin.  Has not tolerated any of that.  He also did not tolerate ezetimibe. ? ?Not interested in other medications.  We will simply treat with diet and exercise ?  ?  ? ?=================================== ? ?HPI:   ? ?[name] is a 81 y.o. male with a CARDIAC HISTORY below who presents today for 6 month f/u for CAD, Combined Ischemic/LBBB related CM w/ Chronic Combined CHF (NYHA Class II, Stage D). ? ?CAD->CABG 07/2004 (Sx = fatigue & PVCs) --> CATH -> MV CAD --> CABG X 4 (RIMA-LAD, LIMA-Diag, SVG-OM, SVG-RCA) ?04/2011 - recurrent Sx -> Myoview + for Inf Ischemia --> Cath => Only RIMA-LAD patent --> DES PCI of Native Cx-OM (2 overlapping Promus DES) & Native RCA ?June 2016 - DES PCI of the dRCA-RPAV. Since then he has done very well with no active anginal symptoms. 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 EF ~35-40%. Normal RHC (PCWP 12 mmHg, LVEDP 14 mmHg) ?06/2019 - Echo EF down to 30-35% - referred to Dr. [name] for CRT (P vs. D) -> delayed in order to"OPTIMIZE Med  Rx"  -- attemtped to place on Carvedilol & Entreso (has never been able totoalerate any meds - BB or ARB in the past)  ?CRT-P (not Defib b/c felt that with CRT, EF could improve) -St Jude BiVPPM => nominal improvement in E level  ?Following CRT-P-- he stopped taking Entresto & was converted to Bisoprolol 2.5 mg.  ? ?[name] was last seen on 05/2021 for routine follow-up, doing overall pretty well.  He had just taken his bisoprolol for history still taking a total of 2.5 mg twice daily but is taking 2.5/625 bisoprolol-HCTZ in the mornings, and regular bisoprolol 2.5 mg in the evening.  This has dramatically improved his edema.  Was not able to tolerate going back onto [employer].  He was using his Lasix 3-4 times a week prior to starting his HCTZ but then since  starting the HCTZ back, maybe 2 or 3 times a week. ?-> Denied chest pain or pressure with rest or exertion.  Still some fatigue and exercise intolerance/DOE.Marland Kitchen  No PND orthopnea.  No arrhythmia symptoms.  No longer feeling PVCs. ?->Somewhat upset because of his 1 son having passed away and the other son simply doing worse with progressive cardiomyopathy (about ready to give up his practice) -> potentially near end-stage requiring heart transplant.. ? ?Recent Hospitalizations: None ? ?Reviewed  CV studies:   ? ?The following studies were reviewed today: (if available, images/films reviewed: From Epic Chart or Care Everywhere) ?No new studies. ? ? ?Interval History:  ? ?[name] presents here today doing fairly well.  He is admitted not very active, but still is always doing  things.  He is little upset that he cannot lose weight, and thinks maybe he is definitely slowing down.  Its not an acute finding, more of a chronic issue over the last year or so.  He is not having palpitations or PVCs that he had had.  He thinks it clearly since he had the BiV pacer placed and he is feeling symptomatically better even though the numbers do not show that his EF is improved. ? ?He has not really had any PND, orthopnea and trivial edema.  Not really using Lasix-maybe 2-3 times a week.  The minuscule dose of HCTZ that he takes when he takes a half of the bisoprolol and HCTZ in the morning seems to keep his edema at bay.  He says his blood pressures are usually higher in the morning, but then once he takes his bisoprolol HCTZ the pressures come down nicely.  He has not had any dizziness or wooziness. ? ?He is pretty happy with where his medicines are, but indicated that he did not tolerate taking the 10 mg pravastatin every day.  He is trying to take it every other day and was still having issues with myalgias and arthralgias.  As is usually the case, he just does not tolerate most medicines.  He does not seem to be all that  interested in injection medications. ? ?He said back in the October (May 10) he had a spell where he just could not concentrate, temporarily had memory loss with difficulty in speech that lasted may be an hour or 2.

## 2021-12-06 NOTE — Patient Instructions (Addendum)
Medication Instructions:  ? ?No changes ? ?*If you need a refill on your cardiac medications before your next appointment, please call your pharmacy* ? ? ?Lab Work: ?Not needed ? ? ? ?Testing/Procedures: ? ?No tneeded ? ?Follow-Up: ?At [facility], you and your health needs are our priority.  As part of our continuing mission to provide you with exceptional heart care, we have created designated Provider Care Teams.  These Care Teams include your primary Cardiologist (physician) and Advanced Practice Providers (APPs -  Physician Assistants and Nurse Practitioners) who all work together to provide you with the care you need, when you need it. ? ?  ? ?Your next appointment:   ?6 month(s) Oct 2023 ? ?The format for your next appointment:   ?In Person ? ?Provider:   ?[name], MD  ? ? ?

## 2021-12-16 ENCOUNTER — Encounter: Payer: Self-pay | Admitting: Cardiology

## 2021-12-16 DIAGNOSIS — T466X5A Adverse effect of antihyperlipidemic and antiarteriosclerotic drugs, initial encounter: Secondary | ICD-10-CM | POA: Insufficient documentation

## 2021-12-16 NOTE — Assessment & Plan Note (Addendum)
NYHA IIB Symptoms, stable.  Edema well controlled with HCTZ and low-dose intermittent Lasix.  He says that his energy level of dyspnea has definitely improved since he is paced.  He was upset to see that his EF did not improve, but he does state that the patient has improved his energy level and decrease his dyspnea. ? ?Only tolerating bisoprolol with minimal dose of HCTZ.  Otherwise has not tolerated ARB or Entresto.  I am very reluctant to even consider SGLT2 inhibitor, basically the fact that he pretty much to talk despite everything he takes.Marland Kitchen ?

## 2021-12-16 NOTE — Assessment & Plan Note (Signed)
He has not tried atorvastatin, rosuvastatin and pravastatin as well as distant history of simvastatin.  Has not tolerated any of that.  He also did not tolerate ezetimibe. ? ?Not interested in other medications.  We will simply treat with diet and exercise ?

## 2021-12-16 NOTE — Assessment & Plan Note (Signed)
Pretty much not tolerant of any type of blood pressure medications.  His systolic blood pressure is usually in the 120s-150s at home. ? ?At this point I think he is well within target range for his blood pressure recordings.  With his easy fatigue and orthostatic type symptoms, would not pressure medications any further.  In fact he probably would not take them regardless. ?

## 2021-12-16 NOTE — Assessment & Plan Note (Signed)
Well-controlled with his PRN Lasix and low-dose HCTZ. ?

## 2021-12-16 NOTE — Assessment & Plan Note (Signed)
Unfortunately, his EF did not improve with CRT-P, and we have not really been able to titrate guideline directed medical therapy because of his intolerances to everything.  Despite this, symptomatically CRT-P has improved his symptoms of dyspnea and fatigue.  Edema is well controlled with no PND orthopnea. ?

## 2021-12-16 NOTE — Assessment & Plan Note (Signed)
Target range for him for systolic pressures in September 2 100 2060.  He becomes symptomatic if we try to push his medicines further anyway.  Therefore we will simply continue current doses of bisoprolol and bisoprolol-HCTZ.  We have tried ARB as well as Entresto but he has not tolerated.  He also did not tolerate spironolactone. ?

## 2021-12-16 NOTE — Assessment & Plan Note (Signed)
He says he really is not feeling PVCs anymore since taking the 2.5 mg bisoprolol twice daily. ?

## 2021-12-16 NOTE — Assessment & Plan Note (Signed)
He did not tolerate even half tablet (10 mg) p.o. pravastatin every other day. ? ?At this point, distal somewhat try other medications.  Is not really interested in trying injection medications. ?

## 2021-12-16 NOTE — Assessment & Plan Note (Signed)
All the one graft was occluded (RIMA-LAD) now all remaining coronary arteries have been stented (LCx, RCA and rPAV). ? ?No active anginal symptoms.  He initially notes having frequent PVCs as an anginal equivalent.  He is not feeling any PVCs since we titrated beta-blocker dose. ? ?Plan: Continue aspirin, beta-blocker. ? ?Intolerant of statins-not willing to take additional medications. ?No ARB-unable to tolerate lower blood pressures. ?

## 2022-01-18 DIAGNOSIS — N281 Cyst of kidney, acquired: Secondary | ICD-10-CM | POA: Diagnosis not present

## 2022-01-18 DIAGNOSIS — N200 Calculus of kidney: Secondary | ICD-10-CM | POA: Diagnosis not present

## 2022-01-27 DIAGNOSIS — N138 Other obstructive and reflux uropathy: Secondary | ICD-10-CM | POA: Diagnosis not present

## 2022-01-27 DIAGNOSIS — N529 Male erectile dysfunction, unspecified: Secondary | ICD-10-CM | POA: Diagnosis not present

## 2022-01-27 DIAGNOSIS — N200 Calculus of kidney: Secondary | ICD-10-CM | POA: Diagnosis not present

## 2022-01-27 DIAGNOSIS — N35013 Post-traumatic anterior urethral stricture: Secondary | ICD-10-CM | POA: Diagnosis not present

## 2022-01-27 DIAGNOSIS — N401 Enlarged prostate with lower urinary tract symptoms: Secondary | ICD-10-CM | POA: Diagnosis not present

## 2022-01-27 DIAGNOSIS — R972 Elevated prostate specific antigen [PSA]: Secondary | ICD-10-CM | POA: Diagnosis not present

## 2022-01-27 DIAGNOSIS — N281 Cyst of kidney, acquired: Secondary | ICD-10-CM | POA: Diagnosis not present

## 2022-01-27 DIAGNOSIS — E291 Testicular hypofunction: Secondary | ICD-10-CM | POA: Diagnosis not present

## 2022-02-01 DIAGNOSIS — H5022 Vertical strabismus, left eye: Secondary | ICD-10-CM | POA: Diagnosis not present

## 2022-02-01 DIAGNOSIS — H53451 Other localized visual field defect, right eye: Secondary | ICD-10-CM | POA: Diagnosis not present

## 2022-02-01 DIAGNOSIS — Z961 Presence of intraocular lens: Secondary | ICD-10-CM | POA: Diagnosis not present

## 2022-02-03 DIAGNOSIS — R7301 Impaired fasting glucose: Secondary | ICD-10-CM | POA: Diagnosis not present

## 2022-02-03 DIAGNOSIS — E782 Mixed hyperlipidemia: Secondary | ICD-10-CM | POA: Diagnosis not present

## 2022-02-07 DIAGNOSIS — R978 Other abnormal tumor markers: Secondary | ICD-10-CM | POA: Diagnosis not present

## 2022-02-07 DIAGNOSIS — Z86010 Personal history of colonic polyps: Secondary | ICD-10-CM | POA: Diagnosis not present

## 2022-02-07 DIAGNOSIS — R7301 Impaired fasting glucose: Secondary | ICD-10-CM | POA: Diagnosis not present

## 2022-02-07 DIAGNOSIS — E291 Testicular hypofunction: Secondary | ICD-10-CM | POA: Diagnosis not present

## 2022-02-07 DIAGNOSIS — J4540 Moderate persistent asthma, uncomplicated: Secondary | ICD-10-CM | POA: Diagnosis not present

## 2022-02-07 DIAGNOSIS — M545 Low back pain, unspecified: Secondary | ICD-10-CM | POA: Diagnosis not present

## 2022-02-07 DIAGNOSIS — G250 Essential tremor: Secondary | ICD-10-CM | POA: Diagnosis not present

## 2022-02-07 DIAGNOSIS — I10 Essential (primary) hypertension: Secondary | ICD-10-CM | POA: Diagnosis not present

## 2022-02-07 DIAGNOSIS — E782 Mixed hyperlipidemia: Secondary | ICD-10-CM | POA: Diagnosis not present

## 2022-02-07 DIAGNOSIS — I5042 Chronic combined systolic (congestive) and diastolic (congestive) heart failure: Secondary | ICD-10-CM | POA: Diagnosis not present

## 2022-02-07 DIAGNOSIS — E875 Hyperkalemia: Secondary | ICD-10-CM | POA: Diagnosis not present

## 2022-02-07 DIAGNOSIS — I420 Dilated cardiomyopathy: Secondary | ICD-10-CM | POA: Diagnosis not present

## 2022-02-09 ENCOUNTER — Ambulatory Visit (INDEPENDENT_AMBULATORY_CARE_PROVIDER_SITE_OTHER): Payer: Medicare Other

## 2022-02-09 DIAGNOSIS — I420 Dilated cardiomyopathy: Secondary | ICD-10-CM | POA: Diagnosis not present

## 2022-02-10 LAB — CUP PACEART REMOTE DEVICE CHECK
Battery Remaining Longevity: 70 mo
Battery Remaining Percentage: 74 %
Battery Voltage: 2.99 V
Brady Statistic AP VP Percent: 93 %
Brady Statistic AP VS Percent: 1 %
Brady Statistic AS VP Percent: 5.1 %
Brady Statistic AS VS Percent: 1 %
Brady Statistic RA Percent Paced: 92 %
Date Time Interrogation Session: 20230607020009
Implantable Lead Implant Date: 20210607
Implantable Lead Implant Date: 20210607
Implantable Lead Implant Date: 20210607
Implantable Lead Location: 753858
Implantable Lead Location: 753859
Implantable Lead Location: 753860
Implantable Pulse Generator Implant Date: 20210607
Lead Channel Impedance Value: 450 Ohm
Lead Channel Impedance Value: 460 Ohm
Lead Channel Impedance Value: 790 Ohm
Lead Channel Pacing Threshold Amplitude: 0.625 V
Lead Channel Pacing Threshold Amplitude: 0.75 V
Lead Channel Pacing Threshold Amplitude: 0.875 V
Lead Channel Pacing Threshold Pulse Width: 0.5 ms
Lead Channel Pacing Threshold Pulse Width: 0.5 ms
Lead Channel Pacing Threshold Pulse Width: 0.5 ms
Lead Channel Sensing Intrinsic Amplitude: 3.7 mV
Lead Channel Sensing Intrinsic Amplitude: 7 mV
Lead Channel Setting Pacing Amplitude: 2 V
Lead Channel Setting Pacing Amplitude: 2 V
Lead Channel Setting Pacing Amplitude: 2 V
Lead Channel Setting Pacing Pulse Width: 0.5 ms
Lead Channel Setting Pacing Pulse Width: 0.5 ms
Lead Channel Setting Sensing Sensitivity: 2 mV
Pulse Gen Model: 3562
Pulse Gen Serial Number: 3818273

## 2022-02-18 NOTE — Progress Notes (Signed)
Remote pacemaker transmission.   

## 2022-02-24 DIAGNOSIS — L570 Actinic keratosis: Secondary | ICD-10-CM | POA: Diagnosis not present

## 2022-02-24 DIAGNOSIS — L820 Inflamed seborrheic keratosis: Secondary | ICD-10-CM | POA: Diagnosis not present

## 2022-02-24 DIAGNOSIS — L821 Other seborrheic keratosis: Secondary | ICD-10-CM | POA: Diagnosis not present

## 2022-02-24 DIAGNOSIS — D485 Neoplasm of uncertain behavior of skin: Secondary | ICD-10-CM | POA: Diagnosis not present

## 2022-03-29 DIAGNOSIS — L821 Other seborrheic keratosis: Secondary | ICD-10-CM | POA: Diagnosis not present

## 2022-03-29 DIAGNOSIS — L820 Inflamed seborrheic keratosis: Secondary | ICD-10-CM | POA: Diagnosis not present

## 2022-03-29 DIAGNOSIS — L570 Actinic keratosis: Secondary | ICD-10-CM | POA: Diagnosis not present

## 2022-04-13 DIAGNOSIS — D485 Neoplasm of uncertain behavior of skin: Secondary | ICD-10-CM | POA: Diagnosis not present

## 2022-04-13 DIAGNOSIS — D1801 Hemangioma of skin and subcutaneous tissue: Secondary | ICD-10-CM | POA: Diagnosis not present

## 2022-04-13 DIAGNOSIS — L814 Other melanin hyperpigmentation: Secondary | ICD-10-CM | POA: Diagnosis not present

## 2022-04-13 DIAGNOSIS — L821 Other seborrheic keratosis: Secondary | ICD-10-CM | POA: Diagnosis not present

## 2022-04-13 DIAGNOSIS — L820 Inflamed seborrheic keratosis: Secondary | ICD-10-CM | POA: Diagnosis not present

## 2022-04-13 DIAGNOSIS — L578 Other skin changes due to chronic exposure to nonionizing radiation: Secondary | ICD-10-CM | POA: Diagnosis not present

## 2022-04-13 DIAGNOSIS — L570 Actinic keratosis: Secondary | ICD-10-CM | POA: Diagnosis not present

## 2022-05-11 ENCOUNTER — Ambulatory Visit (INDEPENDENT_AMBULATORY_CARE_PROVIDER_SITE_OTHER): Payer: Medicare Other

## 2022-05-11 DIAGNOSIS — I420 Dilated cardiomyopathy: Secondary | ICD-10-CM | POA: Diagnosis not present

## 2022-05-11 LAB — CUP PACEART REMOTE DEVICE CHECK
Battery Remaining Longevity: 66 mo
Battery Remaining Percentage: 71 %
Battery Voltage: 2.99 V
Brady Statistic AP VP Percent: 92 %
Brady Statistic AP VS Percent: 1 %
Brady Statistic AS VP Percent: 4.7 %
Brady Statistic AS VS Percent: 1 %
Brady Statistic RA Percent Paced: 91 %
Date Time Interrogation Session: 20230906023507
Implantable Lead Implant Date: 20210607
Implantable Lead Implant Date: 20210607
Implantable Lead Implant Date: 20210607
Implantable Lead Location: 753858
Implantable Lead Location: 753859
Implantable Lead Location: 753860
Implantable Pulse Generator Implant Date: 20210607
Lead Channel Impedance Value: 440 Ohm
Lead Channel Impedance Value: 450 Ohm
Lead Channel Impedance Value: 760 Ohm
Lead Channel Pacing Threshold Amplitude: 0.75 V
Lead Channel Pacing Threshold Amplitude: 0.75 V
Lead Channel Pacing Threshold Amplitude: 1 V
Lead Channel Pacing Threshold Pulse Width: 0.5 ms
Lead Channel Pacing Threshold Pulse Width: 0.5 ms
Lead Channel Pacing Threshold Pulse Width: 0.5 ms
Lead Channel Sensing Intrinsic Amplitude: 10.1 mV
Lead Channel Sensing Intrinsic Amplitude: 4.2 mV
Lead Channel Setting Pacing Amplitude: 2 V
Lead Channel Setting Pacing Amplitude: 2 V
Lead Channel Setting Pacing Amplitude: 2 V
Lead Channel Setting Pacing Pulse Width: 0.5 ms
Lead Channel Setting Pacing Pulse Width: 0.5 ms
Lead Channel Setting Sensing Sensitivity: 2 mV
Pulse Gen Model: 3562
Pulse Gen Serial Number: 3818273

## 2022-05-30 NOTE — Progress Notes (Signed)
Remote pacemaker transmission.   

## 2022-06-21 DIAGNOSIS — Z23 Encounter for immunization: Secondary | ICD-10-CM | POA: Diagnosis not present

## 2022-06-27 ENCOUNTER — Ambulatory Visit (INDEPENDENT_AMBULATORY_CARE_PROVIDER_SITE_OTHER): Payer: Medicare Other | Admitting: Internal Medicine

## 2022-06-27 ENCOUNTER — Other Ambulatory Visit (INDEPENDENT_AMBULATORY_CARE_PROVIDER_SITE_OTHER): Payer: Medicare Other

## 2022-06-27 ENCOUNTER — Encounter: Payer: Self-pay | Admitting: Internal Medicine

## 2022-06-27 VITALS — BP 162/102 | HR 65 | Ht 68.0 in | Wt 190.0 lb

## 2022-06-27 DIAGNOSIS — Z9861 Coronary angioplasty status: Secondary | ICD-10-CM

## 2022-06-27 DIAGNOSIS — Z86010 Personal history of colonic polyps: Secondary | ICD-10-CM

## 2022-06-27 DIAGNOSIS — R109 Unspecified abdominal pain: Secondary | ICD-10-CM | POA: Diagnosis not present

## 2022-06-27 DIAGNOSIS — I2510 Atherosclerotic heart disease of native coronary artery without angina pectoris: Secondary | ICD-10-CM | POA: Diagnosis not present

## 2022-06-27 DIAGNOSIS — Z800 Family history of malignant neoplasm of digestive organs: Secondary | ICD-10-CM | POA: Diagnosis not present

## 2022-06-27 DIAGNOSIS — M6208 Separation of muscle (nontraumatic), other site: Secondary | ICD-10-CM | POA: Diagnosis not present

## 2022-06-27 DIAGNOSIS — K219 Gastro-esophageal reflux disease without esophagitis: Secondary | ICD-10-CM

## 2022-06-27 LAB — BASIC METABOLIC PANEL
BUN: 15 mg/dL (ref 6–23)
CO2: 31 mEq/L (ref 19–32)
Calcium: 9.8 mg/dL (ref 8.4–10.5)
Chloride: 101 mEq/L (ref 96–112)
Creatinine, Ser: 0.9 mg/dL (ref 0.40–1.50)
GFR: 79.47 mL/min (ref >60.00)
Glucose, Bld: 102 mg/dL — ABNORMAL HIGH (ref 70–99)
Potassium: 4.4 mEq/L (ref 3.5–5.1)
Sodium: 137 mEq/L (ref 135–145)

## 2022-06-27 NOTE — Progress Notes (Signed)
HISTORY OF PRESENT ILLNESS:  [name] is a 81 y.o. male with multiple significant medical problems as listed below.  He presents himself today with a chief complaint of abdominal discomfort possibly related to hernia.  Patient does have a history of GERD, family history of gastric cancer, and personal history of helical back to pylori having been treated.  Last upper endoscopy March 2018.  Testing for Helicobacter pylori was negative.  He also has a history of colon polyps (both adenomatous and sessile serrated) family history of colon cancer in his brother less than age 22.  Last complete colonoscopy March 2018.  Diminutive polyps only.  Aged out of surveillance.  Patient tells me that he has noticed protrusion of his abdomen with standing.  Can be uncomfortable when leaning against a counter.  Also an area that he describes as "hard" area near the navel.  No nausea or vomiting.  No change in bowel habits from baseline.  He did undergo CT scan of the abdomen and pelvis without contrast in 2015.  Kidney stone disease noted.  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   Frequent PVCs & fatigue -> MYOVIEW w/ inferior /r MV distribution --> CATH: only RIMA-LAD patent (LIMA atretic & SVGs occluded) --> Staged PCI of Native RCA & Cx; followed by PCI-distal RCA   CAD S/P percutaneous coronary angioplasty 8/'12; 06/'16   a) PCI- Cx-OM W/ 2 overlapping Resolute DES 3x15 & 3x18 stents (3.53m),  PCI-RCA -  Promus DES 3x38 (3.25m;; b) 6/'16: PCPaulES 2.75X28 (3.0 mm) -- stents patent in 01/2018;  01/2018 -stents remain patent   CAS  (cerebral atherosclerosis)    CAROTID DOPPLER, 08/25/2011 - mildly abnormal   Chronic back pain    Dilated cardiomyopathy ([facility] EF ~30-35%, 01/05/2018   11/2019 - EF ~30%, global HK with Septal dyssynergy --> 6/6/'21 - s/p CRT-P (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 unclear of etiology    Past Surgical History:  Procedure Laterality Date   BIV PACEMAKER INSERTION CRT-P N/A 02/10/2020   Procedure: BIV PACEMAKER INSERTION CRT-P;  Surgeon: CaConstance HawMD;  [facility];  Service: Cardiovascular;  Laterality: N/A;   CARDIAC CATHETERIZATION  07/15/2004   CABG recommended; continue medical therapy   CARDIAC CATHETERIZATION  04/26/2011   Patent RIMA-LAD, atretic  LIMA-Dx (previous) 100 and CTO SVG to Cx and SVG to PDA. LAD 80-90% eccentric stenosis at SP1 that has 70% stenosis. Cx: Large OM and another distal branch with 90-95% stenosis followed by lesion in the OM branch. RCA dominant 60-80% lesion proximally w/ several old lesions. PDA occluded 70-80% annular lesion in the proximal RCA. 3 of 4 grafts CTO RIMA-LAD patent   CARDIAC CATHETERIZATION N/A 02/20/2015   Procedure: Left Heart Cath and Coronary Angiography;  Surgeon: Leonie Man, MD;  Location: [facility]: Widely patent p-mRCA DES with dRCA 50%-PAVG 80% --> PCI. Patent overlapping stents --mCx-OM. 99%  pLAD - patent RIMA-dLAD.  Known CTO SVG-rPL, SVG-OM3, :LIMA-D1.   CATARACT EXTRACTION W/ INTRAOCULAR LENS  IMPLANT, BILATERAL Bilateral ~ 2006/2007   CORONARY ANGIOPLASTY WITH STENT PLACEMENT  04/28/2011; 02/20/2015   PCI-Cx-OM 3 overlapping Resolute DES 3 mm x64m and 3x164m--> 3.69m48mPCI to RCA - Promus Element DES 3x38m30m> 3.269mm41m) dRCA-rPAV: Promus Premier DES 2.769mm 47m mm (3.0 mm)    CORONARY ARTERY BYPASS GRAFT  07/23/2004   LIMA-Cx RIMA-LAD, SVG-diagonal, SVG-PDA   RIGHT/LEFT HEART CATH AND CORONARY/GRAFT ANGIOGRAPHY N/A 01/09/2018   Procedure: RIGHT/LEFT HEART CATH AND CORONARY/GRAFT ANGIOGRAPHY;  Surgeon: HardinLeonie Man Known occluded LIMA-Diag & SVG-RCA, SVG-OM.  Mod-severe pLAD that is grafted after D1 - patent RIMA- wraparound LAD, distal 1/2 of PDA territory. Patent Cx-OM DES (ost Cx mild) & p-mRCA overlapping DES & dRCA-RPAV DES.  PCWP 12 mmHg, LVP-EDP: 140/6 - 14 mmH. RAP 2 mmHg. RVP-EDP 33/0 - 5  mmHg   TRANSTHORACIC ECHOCARDIOGRAM  02/18/2015    Mild concentric LVH, EF 55-60%, no RWMA, Gr 1 DD, aortic sclerosis without stenosis   TRANSTHORACIC ECHOCARDIOGRAM  12/2017; 06/2019   (New LBBB)  EF~35% with diffuse HK of septal & lateral walls.  Mildly dilated LV.  "GR 1 "DD with high filling pressures. ? w/ normal LA size; 10/20: EF 30-35%, LBBB related Septal dyskinesis, GR2 DD (but normal LA size).    TRANSTHORACIC ECHOCARDIOGRAM  11/27/2019   (On optimal/max tolerated dose of carvedilol and Entresto): EF estimated 30% with moderate to severe decreased function and global hypokinesis.  Significant septal-lateral dyssynchrony from LBBB.  Mild LV dilation.  At least GR 1 DD.  Mildly reduced RV function but normal pressures.  Mild aortic sclerosis but no significant regurgitation.   TRANSTHORACIC ECHOCARDIOGRAM  09/14/2020   (s/p CRT-P) EF estimated 30%. Moderately decreased function with global HK. GR 1 DD. Mildly reduced RV function. Mild aortic valve thickening/sclerosis but  no stenosis. (Compared to prior study, no notable change in fxn, but decreased LV r dilation with slight decrease in RV function).   TRANSURETHRAL RESECTION OF PROSTATE  ~ 2002/2003    Social History [name]  reports that he quit smoking about 58 years ago. His smoking use included cigarettes. He has a 7.00 pack-year smoking history. He has never used smokeless tobacco. He reports that he does not drink alcohol and does not use drugs.  family history includes Colon cancer in his brother; Heart disease in his brother; Heart disease (age of onset: 68) in73is father; Hypertension (age of onset: 14) in76is brother; Hypertension (age of onset: 49) in31is mother; Stomach cancer in his sister; Stroke (age of onset: 89) in45is mother.  Allergies  Allergen Reactions   Meperidine Hcl     Cold sweats, dizziness    Penicillins     Severe headaches Has patient had a PCN  reaction causing immediate rash, facial/tongue/throat swelling, SOB or lightheadedness with hypotension: No Has patient had a PCN reaction causing severe rash involving mucus membranes or skin necrosis: No Has patient had a PCN reaction that required hospitalization: No Has patient had a PCN reaction occurring within the last 10 years: No If all of the above answers are "NO", then may proceed with Cephalosporin use.        PHYSICAL EXAMINATION: Vital signs: BP (!) 162/102   Pulse 65   Ht '5\' 8"'$  (1.727 m)   Wt 190 lb (86.2 kg)   BMI 28.89 kg/m   Constitutional: generally well-appearing, no acute distress.  Hearing aids Psychiatric: alert and oriented x3, cooperative Eyes: extraocular movements intact, anicteric, conjunctiva pink Mouth: oral pharynx moist, no lesions Neck: supple no lymphadenopathy Cardiovascular: heart regular rate and rhythm, no murmur Lungs: clear to auscultation bilaterally Abdomen: soft, nontender, nondistended, no obvious ascites, no peritoneal signs, normal bowel sounds, no organomegaly.  There is  diastases of the rectus abdominis without certain hernia. Rectal: Omitted Extremities: no clubbing, cyanosis, or lower extremity edema bilaterally Skin: no lesions on visible extremities Neuro: No focal deficits.  Cranial nerves intact  ASSESSMENT:  1.  Abdominal discomfort and abnormal physical exam consistent with diastases rectus abdominis. 2.  Personal history of colon polyps 3.  Family history of colon cancer and gastric cancer 4.  GERD and history of H. pylori, treated. 5.  Multiple medical problems   PLAN:  1.  Discussion on abdominal diastases 2.  Contrast CT scan of the abdomen pelvis to rule out underlying hernia not appreciated on physical exam, given the discomfort. 3.  Aged out of surveillance colonoscopy.  Discussed with him, after he raised the question. A total time of 45 minutes was spent preparing to see the patient, obtaining history, performing comprehensive physical exam, counseling and educating the patient regarding the above listed issues, ordering advanced imaging, and documenting clinical information in the health record

## 2022-06-27 NOTE — Patient Instructions (Signed)
_______________________________________________________  If you are age 82 or older, your body mass index should be between 23-30. Your Body mass index is 28.89 kg/m. If this is out of the aforementioned range listed, please consider follow up with your Primary Care Provider.  If you are age 39 or younger, your body mass index should be between 19-25. Your Body mass index is 28.89 kg/m. If this is out of the aformentioned range listed, please consider follow up with your Primary Care Provider.   ________________________________________________________  The [facility] providers would like to encourage you to use [facility] to communicate with providers for non-urgent requests or questions.  Due to long hold times on the telephone, sending your provider a message by [facility] may be a faster and more efficient way to get a response.  Please allow 48 business hours for a response.  Please remember that this is for non-urgent requests.  _______________________________________________________ Your provider has requested that you go to the basement level for lab work before leaving today. Press "B" on the elevator. The lab is located at the first door on the left as you exit the elevator.  You have been scheduled for a CT scan of the abdomen and pelvis at [facility], 1st floor Radiology. You are scheduled on 07/01/2022 at 12:30pm. You should arrive 15 minutes prior to your appointment time for registration.  We are giving you 2 bottles of contrast today that you will need to drink before arriving for the exam. The solution may taste better if refrigerated so put them in the refrigerator when you get home, but do NOT add ice or any other liquid to this solution as that would dilute it. Shake well before drinking.   Please follow the written instructions below on the day of your exam:   1) Do not eat anything after 8:30am (4 hours prior to your test)    You may take any medications as prescribed  with a small amount of water, if necessary. If you take any of the following medications: METFORMIN, GLUCOPHAGE, GLUCOVANCE, AVANDAMET, RIOMET, FORTAMET, [place] MET, JANUMET, GLUMETZA or METAGLIP, you MAY be asked to HOLD this medication 48 hours AFTER the exam.   The purpose of you drinking the oral contrast is to aid in the visualization of your intestinal tract. The contrast solution may cause some diarrhea. Depending on your individual set of symptoms, you may also receive an intravenous injection of x-ray contrast/dye. Plan on being at [facility] for 45 minutes or longer, depending on the type of exam you are having performed.   If you have any questions regarding your exam or if you need to reschedule, you may call [facility] at [phone] between the hours of 8:00 am and 5:00 pm, Monday-Friday.

## 2022-07-01 ENCOUNTER — Ambulatory Visit (HOSPITAL_COMMUNITY)
Admission: RE | Admit: 2022-07-01 | Discharge: 2022-07-01 | Disposition: A | Discharge status: Home / Self Care | Payer: Medicare Other | Source: Ambulatory Visit | Attending: Internal Medicine | Admitting: Internal Medicine

## 2022-07-01 DIAGNOSIS — R109 Unspecified abdominal pain: Secondary | ICD-10-CM | POA: Diagnosis not present

## 2022-07-01 DIAGNOSIS — I700 Atherosclerosis of aorta: Secondary | ICD-10-CM | POA: Diagnosis not present

## 2022-07-01 MED ORDER — SODIUM CHLORIDE (PF) 0.9 % IJ SOLN
INTRAMUSCULAR | Status: AC
  Filled 2022-07-01: qty 50

## 2022-07-01 MED ORDER — IOHEXOL 300 MG/ML  SOLN
100.0000 mL | Freq: Once | INTRAMUSCULAR | Status: AC | PRN
  Administered 2022-07-01: 100 mL via INTRAVENOUS

## 2022-07-02 ENCOUNTER — Other Ambulatory Visit: Payer: Self-pay | Admitting: Cardiology

## 2022-07-04 ENCOUNTER — Encounter: Payer: Self-pay | Admitting: Internal Medicine

## 2022-07-09 DIAGNOSIS — Z23 Encounter for immunization: Secondary | ICD-10-CM | POA: Diagnosis not present

## 2022-07-11 ENCOUNTER — Encounter: Payer: Self-pay | Admitting: Internal Medicine

## 2022-08-02 DIAGNOSIS — E782 Mixed hyperlipidemia: Secondary | ICD-10-CM | POA: Diagnosis not present

## 2022-08-02 DIAGNOSIS — R7301 Impaired fasting glucose: Secondary | ICD-10-CM | POA: Diagnosis not present

## 2022-08-09 DIAGNOSIS — E291 Testicular hypofunction: Secondary | ICD-10-CM | POA: Diagnosis not present

## 2022-08-09 DIAGNOSIS — E875 Hyperkalemia: Secondary | ICD-10-CM | POA: Diagnosis not present

## 2022-08-09 DIAGNOSIS — I10 Essential (primary) hypertension: Secondary | ICD-10-CM | POA: Diagnosis not present

## 2022-08-09 DIAGNOSIS — I5042 Chronic combined systolic (congestive) and diastolic (congestive) heart failure: Secondary | ICD-10-CM | POA: Diagnosis not present

## 2022-08-09 DIAGNOSIS — M545 Low back pain, unspecified: Secondary | ICD-10-CM | POA: Diagnosis not present

## 2022-08-09 DIAGNOSIS — R7301 Impaired fasting glucose: Secondary | ICD-10-CM | POA: Diagnosis not present

## 2022-08-09 DIAGNOSIS — G250 Essential tremor: Secondary | ICD-10-CM | POA: Diagnosis not present

## 2022-08-09 DIAGNOSIS — Z86010 Personal history of colonic polyps: Secondary | ICD-10-CM | POA: Diagnosis not present

## 2022-08-09 DIAGNOSIS — E782 Mixed hyperlipidemia: Secondary | ICD-10-CM | POA: Diagnosis not present

## 2022-08-09 DIAGNOSIS — R978 Other abnormal tumor markers: Secondary | ICD-10-CM | POA: Diagnosis not present

## 2022-08-09 DIAGNOSIS — I420 Dilated cardiomyopathy: Secondary | ICD-10-CM | POA: Diagnosis not present

## 2022-08-09 DIAGNOSIS — J4540 Moderate persistent asthma, uncomplicated: Secondary | ICD-10-CM | POA: Diagnosis not present

## 2022-08-10 ENCOUNTER — Ambulatory Visit (INDEPENDENT_AMBULATORY_CARE_PROVIDER_SITE_OTHER): Payer: Medicare Other

## 2022-08-10 DIAGNOSIS — I420 Dilated cardiomyopathy: Secondary | ICD-10-CM | POA: Diagnosis not present

## 2022-08-10 LAB — CUP PACEART REMOTE DEVICE CHECK
Battery Remaining Longevity: 62 mo
Battery Remaining Percentage: 68 %
Battery Voltage: 2.99 V
Brady Statistic AP VP Percent: 92 %
Brady Statistic AP VS Percent: 1 %
Brady Statistic AS VP Percent: 4.7 %
Brady Statistic AS VS Percent: 1 %
Brady Statistic RA Percent Paced: 90 %
Date Time Interrogation Session: 20231206020010
Implantable Lead Connection Status: 753985
Implantable Lead Connection Status: 753985
Implantable Lead Connection Status: 753985
Implantable Lead Implant Date: 20210607
Implantable Lead Implant Date: 20210607
Implantable Lead Implant Date: 20210607
Implantable Lead Location: 753858
Implantable Lead Location: 753859
Implantable Lead Location: 753860
Implantable Pulse Generator Implant Date: 20210607
Lead Channel Impedance Value: 440 Ohm
Lead Channel Impedance Value: 440 Ohm
Lead Channel Impedance Value: 730 Ohm
Lead Channel Pacing Threshold Amplitude: 0.75 V
Lead Channel Pacing Threshold Amplitude: 0.75 V
Lead Channel Pacing Threshold Amplitude: 0.875 V
Lead Channel Pacing Threshold Pulse Width: 0.5 ms
Lead Channel Pacing Threshold Pulse Width: 0.5 ms
Lead Channel Pacing Threshold Pulse Width: 0.5 ms
Lead Channel Sensing Intrinsic Amplitude: 4.2 mV
Lead Channel Sensing Intrinsic Amplitude: 9.2 mV
Lead Channel Setting Pacing Amplitude: 2 V
Lead Channel Setting Pacing Amplitude: 2 V
Lead Channel Setting Pacing Amplitude: 2 V
Lead Channel Setting Pacing Pulse Width: 0.5 ms
Lead Channel Setting Pacing Pulse Width: 0.5 ms
Lead Channel Setting Sensing Sensitivity: 2 mV
Pulse Gen Model: 3562
Pulse Gen Serial Number: 3818273

## 2022-08-25 DIAGNOSIS — N281 Cyst of kidney, acquired: Secondary | ICD-10-CM | POA: Diagnosis not present

## 2022-08-25 DIAGNOSIS — N138 Other obstructive and reflux uropathy: Secondary | ICD-10-CM | POA: Diagnosis not present

## 2022-08-25 DIAGNOSIS — N401 Enlarged prostate with lower urinary tract symptoms: Secondary | ICD-10-CM | POA: Diagnosis not present

## 2022-08-25 DIAGNOSIS — N200 Calculus of kidney: Secondary | ICD-10-CM | POA: Diagnosis not present

## 2022-08-25 DIAGNOSIS — N529 Male erectile dysfunction, unspecified: Secondary | ICD-10-CM | POA: Diagnosis not present

## 2022-08-25 DIAGNOSIS — R972 Elevated prostate specific antigen [PSA]: Secondary | ICD-10-CM | POA: Diagnosis not present

## 2022-08-25 DIAGNOSIS — E291 Testicular hypofunction: Secondary | ICD-10-CM | POA: Diagnosis not present

## 2022-08-25 DIAGNOSIS — N35013 Post-traumatic anterior urethral stricture: Secondary | ICD-10-CM | POA: Diagnosis not present

## 2022-09-02 NOTE — Progress Notes (Signed)
Remote pacemaker transmission.   

## 2022-09-06 NOTE — Progress Notes (Signed)
Labs resulted 08/04/2022:  Na+ 140, K+ 5.5*, Cl- 102, HCO3-25, BUN 15, Cr 0.9, Glu 100, Ca2+ 9.6; AST 20, ALT 18, AlkP 80 CBC: W 8.7, H/H 14.6/44 point, Plt 237 TC 235, TG 109, HDL 44, LDL 171** =; A1c 6.3  Labs reviewed. STEMI little high.  Lipids are very poorly controlled.  Would like to talk about the possibility of PCSK9 inhibitors follow-up.  [name], MD

## 2022-10-22 ENCOUNTER — Encounter (HOSPITAL_COMMUNITY): Payer: Self-pay | Admitting: Emergency Medicine

## 2022-10-22 ENCOUNTER — Emergency Department (HOSPITAL_COMMUNITY): Payer: Medicare Other

## 2022-10-22 ENCOUNTER — Emergency Department (HOSPITAL_COMMUNITY)
Admission: EM | Admit: 2022-10-22 | Discharge: 2022-10-22 | Disposition: A | Discharge status: Home / Self Care | Payer: Medicare Other | Attending: Emergency Medicine | Admitting: Emergency Medicine

## 2022-10-22 ENCOUNTER — Other Ambulatory Visit: Payer: Self-pay

## 2022-10-22 DIAGNOSIS — Z7982 Long term (current) use of aspirin: Secondary | ICD-10-CM | POA: Diagnosis not present

## 2022-10-22 DIAGNOSIS — K5730 Diverticulosis of large intestine without perforation or abscess without bleeding: Secondary | ICD-10-CM | POA: Diagnosis not present

## 2022-10-22 DIAGNOSIS — N200 Calculus of kidney: Secondary | ICD-10-CM | POA: Diagnosis not present

## 2022-10-22 DIAGNOSIS — R112 Nausea with vomiting, unspecified: Secondary | ICD-10-CM | POA: Diagnosis present

## 2022-10-22 DIAGNOSIS — Z950 Presence of cardiac pacemaker: Secondary | ICD-10-CM | POA: Diagnosis not present

## 2022-10-22 DIAGNOSIS — I700 Atherosclerosis of aorta: Secondary | ICD-10-CM | POA: Diagnosis not present

## 2022-10-22 DIAGNOSIS — N132 Hydronephrosis with renal and ureteral calculous obstruction: Secondary | ICD-10-CM | POA: Diagnosis not present

## 2022-10-22 LAB — COMPREHENSIVE METABOLIC PANEL
ALT: 22 U/L (ref 0–44)
AST: 24 U/L (ref 15–41)
Albumin: 4.1 g/dL (ref 3.5–5.0)
Alkaline Phosphatase: 69 U/L (ref 38–126)
Anion gap: 8 (ref 5–15)
BUN: 15 mg/dL (ref 8–23)
CO2: 27 mmol/L (ref 22–32)
Calcium: 9.2 mg/dL (ref 8.9–10.3)
Chloride: 102 mmol/L (ref 98–111)
Creatinine, Ser: 1.06 mg/dL (ref 0.61–1.24)
GFR, Estimated: >60 mL/min (ref >60)
Glucose, Bld: 170 mg/dL — ABNORMAL HIGH (ref 70–99)
Potassium: 4.7 mmol/L (ref 3.5–5.1)
Sodium: 137 mmol/L (ref 135–145)
Total Bilirubin: 0.7 mg/dL (ref 0.3–1.2)
Total Protein: 6.7 g/dL (ref 6.5–8.1)

## 2022-10-22 LAB — URINALYSIS, ROUTINE W REFLEX MICROSCOPIC
Bacteria, UA: NONE SEEN
Bilirubin Urine: NEGATIVE
Glucose, UA: NEGATIVE mg/dL
Ketones, ur: NEGATIVE mg/dL
Leukocytes,Ua: NEGATIVE
Nitrite: NEGATIVE
Protein, ur: 100 mg/dL — AB
Specific Gravity, Urine: 1.02 (ref 1.005–1.030)
pH: 5 (ref 5.0–8.0)

## 2022-10-22 LAB — CBC WITH DIFFERENTIAL/PLATELET
Abs Immature Granulocytes: 0.1 10*3/uL — ABNORMAL HIGH (ref 0.00–0.07)
Basophils Absolute: 0.1 10*3/uL (ref 0.0–0.1)
Basophils Relative: 0 %
Eosinophils Absolute: 0 10*3/uL (ref 0.0–0.5)
Eosinophils Relative: 0 %
HCT: 46.7 % (ref 39.0–52.0)
Hemoglobin: 15.3 g/dL (ref 13.0–17.0)
Immature Granulocytes: 1 %
Lymphocytes Relative: 4 %
Lymphs Abs: 0.7 10*3/uL (ref 0.7–4.0)
MCH: 28.2 pg (ref 26.0–34.0)
MCHC: 32.8 g/dL (ref 30.0–36.0)
MCV: 86.2 fL (ref 80.0–100.0)
Monocytes Absolute: 0.5 10*3/uL (ref 0.1–1.0)
Monocytes Relative: 3 %
Neutro Abs: 18.4 10*3/uL — ABNORMAL HIGH (ref 1.7–7.7)
Neutrophils Relative %: 92 %
Platelets: 246 10*3/uL (ref 150–400)
RBC: 5.42 MIL/uL (ref 4.22–5.81)
RDW: 13.2 % (ref 11.5–15.5)
WBC: 19.8 10*3/uL — ABNORMAL HIGH (ref 4.0–10.5)
nRBC: 0 % (ref 0.0–0.2)

## 2022-10-22 MED ORDER — SODIUM CHLORIDE 0.9 % IV BOLUS
500.0000 mL | Freq: Once | INTRAVENOUS | Status: AC
  Administered 2022-10-22: 500 mL via INTRAVENOUS

## 2022-10-22 MED ORDER — MORPHINE SULFATE (PF) 4 MG/ML IV SOLN
4.0000 mg | Freq: Once | INTRAVENOUS | Status: AC
  Administered 2022-10-22: 4 mg via INTRAVENOUS
  Filled 2022-10-22: qty 1

## 2022-10-22 MED ORDER — HYDROCODONE-ACETAMINOPHEN 5-325 MG PO TABS: 1.0000 | ORAL_TABLET | Freq: Four times a day (QID) | ORAL | 0 refills | Status: DC | PRN

## 2022-10-22 MED ORDER — KETOROLAC TROMETHAMINE 15 MG/ML IJ SOLN
7.5000 mg | Freq: Once | INTRAMUSCULAR | Status: AC
  Administered 2022-10-22: 7.5 mg via INTRAVENOUS
  Filled 2022-10-22: qty 1

## 2022-10-22 NOTE — Discharge Instructions (Addendum)
As discussed, with your diagnosis of kidney stone it is importantly follow-up with our urologist in 48 hours.  Take all medication as prescribed and monitor your condition carefully.  Do not hesitate to return here for concerning changes in your condition.

## 2022-10-22 NOTE — ED Provider Notes (Signed)
[facility] Provider Note   CSN: [id number] Arrival date & time: 10/22/22  [id number]     History  Chief Complaint  Patient presents with   Flank Pain    [name] is a 82 y.o. male.  HPI Patient presents with right flank pain radiating towards his testicles.  Onset was 2 days ago.  Since that time pain has been persistent.  There is decreased urination as well.  There is associated nausea and 1 episode of vomiting earlier today.  He does have a history of kidney stones, multiple prior occasions, but has never required stenting, nor lithotripsy.    Home Medications Prior to Admission medications   Medication Sig Start Date End Date Taking? Authorizing Provider  ALPRAZolam Duanne Moron) 0.5 MG tablet Take 0.5 mg by mouth 2 (two) times daily as needed for anxiety (tremors).   Yes [provider]  aspirin EC 81 MG tablet Take 81 mg by mouth daily.    Yes [provider]  bisoprolol (ZEBETA) 5 MG tablet TAKE 1 TABLET BY MOUTH IN THE MORNING AND 1 AT BEDTIME OR  AS  DIRECTED Patient taking differently: Take 5 mg by mouth in the morning and at bedtime. 07/04/22  Yes Leonie Man, MD  chlorzoxazone (PARAFON) 500 MG tablet Take 500 mg by mouth daily as needed for muscle spasms.   Yes [provider]  ezetimibe-simvastatin (VYTORIN) 10-10 MG tablet Take 1 tablet by mouth at bedtime.   Yes [provider]  furosemide (LASIX) 20 MG tablet Take 1 tablet (20 mg total) by mouth as needed for edema. 09/03/18  Yes Leonie Man, MD  HYDROcodone-acetaminophen (NORCO/VICODIN) 5-325 MG tablet Take 1 tablet by mouth every 6 (six) hours as needed for severe pain. 10/22/22  Yes Carmin Muskrat, MD  Polyethyl Glycol-Propyl Glycol (SYSTANE OP) Place 1 drop into both eyes 2 (two) times daily.   Yes [provider]  SYMBICORT 160-4.5 MCG/ACT inhaler Inhale 2 puffs into the lungs in the morning and at bedtime. 08/22/22  Yes  [provider]      Allergies    Meperidine hcl and Penicillins    Review of Systems   Review of Systems  All other systems reviewed and are negative.   Physical Exam Updated Vital Signs BP 137/74   Pulse 74   Temp 97.9 F (36.6 C) (Oral)   Resp 13   Ht 5' 8"$  (1.727 m)   Wt 83.9 kg   SpO2 97%   BMI 28.13 kg/m  Physical Exam Vitals and nursing note reviewed.  Constitutional:      General: He is not in acute distress.    Appearance: He is well-developed. He is not ill-appearing, toxic-appearing or diaphoretic.  HENT:     Head: Normocephalic and atraumatic.  Eyes:     Conjunctiva/sclera: Conjunctivae normal.  Cardiovascular:     Rate and Rhythm: Normal rate and regular rhythm.  Pulmonary:     Effort: Pulmonary effort is normal. No respiratory distress.     Breath sounds: No stridor.  Abdominal:     General: There is no distension.  Skin:    General: Skin is warm and dry.  Neurological:     Mental Status: He is alert and oriented to person, place, and time.     ED Results / Procedures / Treatments   Labs (all labs ordered are listed, but only abnormal results are displayed) Labs Reviewed  COMPREHENSIVE METABOLIC PANEL -  Abnormal; Notable for the following components:      Result Value   Glucose, Bld 170 (*)    All other components within normal limits  CBC WITH DIFFERENTIAL/PLATELET - Abnormal; Notable for the following components:   WBC 19.8 (*)    Neutro Abs 18.4 (*)    Abs Immature Granulocytes 0.10 (*)    All other components within normal limits  URINALYSIS, ROUTINE W REFLEX MICROSCOPIC - Abnormal; Notable for the following components:   Hgb urine dipstick LARGE (*)    Protein, ur 100 (*)    All other components within normal limits    EKG None  Radiology CT Renal Stone Study  Result Date: 10/22/2022 CLINICAL DATA:  Flank pain EXAM: CT ABDOMEN AND PELVIS WITHOUT CONTRAST TECHNIQUE: Multidetector CT imaging of the abdomen and pelvis was  performed following the standard protocol without IV contrast. RADIATION DOSE REDUCTION: This exam was performed according to the departmental dose-optimization program which includes automated exposure control, adjustment of the mA and/or kV according to patient size and/or use of iterative reconstruction technique. COMPARISON:  07/01/2022 FINDINGS: Lower chest: No acute abnormality.  There are pacer wires. Hepatobiliary: No focal liver abnormality is seen. No gallstones, gallbladder wall thickening, or biliary dilatation. Pancreas: Unremarkable. No pancreatic ductal dilatation or surrounding inflammatory changes. Spleen: Normal in size without focal abnormality. Adrenals/Urinary Tract: Bilateral nephrolithiasis identified. Largest stone is 5 mm right midpole. Largest stone on the left is 4 mm mid to lower pole. There is right-sided perinephric stranding and hydronephrosis with a proximal right ureteral stone that measures 8 mm. Stomach/Bowel: Stomach is within normal limits. Appendix appears normal. No evidence of bowel wall thickening, distention, or inflammatory changes. Sigmoid diverticulosis identified without diverticulitis. Vascular/Lymphatic: Aortic atherosclerosis. No enlarged abdominal or pelvic lymph nodes. Retroaortic left renal vein noted. Reproductive: Prostate is unremarkable. Other: No abdominal wall hernia or abnormality. No abdominopelvic ascites. Musculoskeletal: No acute or significant osseous findings. L5-S1 disc space narrowing and sclerosis consistent with degenerative disc disease. IMPRESSION: 1. Bilateral nephrolithiasis. 2. Proximal right ureteral 8 mm stone with obstruction. 3. Diverticulosis. Electronically Signed   By: Sammie Bench M.D.   On: 10/22/2022 09:11    Procedures Procedures    Medications Ordered in ED Medications  ketorolac (TORADOL) 15 MG/ML injection 7.5 mg (has no administration in time range)  sodium chloride 0.9 % bolus 500 mL (500 mLs Intravenous New  Bag/Given 10/22/22 1021)  morphine (PF) 4 MG/ML injection 4 mg (4 mg Intravenous Given 10/22/22 1018)    ED Course/ Medical Decision Making/ A&P                             Medical Decision Making With multiple medical issues including CAD, hyperlipidemia, BPH, kidney stones presents with flank pain radiating to his testicles.  On exam patient is awake, alert, has a nontender genital exam, low suspicion for torsion, greater suspicion for kidney stone, possible obstruction versus infection, bacteremia, sepsis.  Amount and/or Complexity of Data Reviewed External Data Reviewed: notes. Labs: ordered. Decision-making details documented in ED Course. Radiology: ordered and independent interpretation performed. Decision-making details documented in ED Course. Discussion of management or test interpretation with external provider(s): I discussed the patient's findings with our urologist, Dr. Alyson Ingles.  If pain is controlled, no evidence for other acute phenomena patient will be seen in 48 hours in the office.  Risk Prescription drug management. Decision regarding hospitalization.   12:53 PM Patient markedly better after  analgesics.  Reviewed all findings, urinalysis without evidence for infection, he does have an 8 mm stone on CT scan, will require prompt urology follow-up, but given his improvement here, absence of hemodynamic instability, absence of evidence for bacteremia, sepsis other complicating phenomena, patient appropriate for close outpatient follow-up.        Final Clinical Impression(s) / ED Diagnoses Final diagnoses:  Kidney stone    Rx / DC Orders ED Discharge Orders          Ordered    HYDROcodone-acetaminophen (NORCO/VICODIN) 5-325 MG tablet  Every 6 hours PRN        10/22/22 1253              Carmin Muskrat, MD 10/22/22 1253

## 2022-10-22 NOTE — ED Triage Notes (Signed)
Patient c/o right flank pain that radiates into testicles bilaterally that started x2 days ago. Per patient nausea, vomiting, and decreased urinary flow. Denies any swelling in testicles, diarrhea, known fever, or hematuria. Patient reports hx of kidney stones. Patient took "an old hydrocodone" from previous kidney stones at 4am with no relief.

## 2022-10-24 ENCOUNTER — Other Ambulatory Visit: Payer: Self-pay

## 2022-10-24 DIAGNOSIS — N200 Calculus of kidney: Secondary | ICD-10-CM

## 2022-10-26 ENCOUNTER — Telehealth: Payer: Self-pay | Admitting: Cardiology

## 2022-10-26 ENCOUNTER — Ambulatory Visit (INDEPENDENT_AMBULATORY_CARE_PROVIDER_SITE_OTHER): Payer: Medicare Other | Admitting: Urology

## 2022-10-26 ENCOUNTER — Encounter: Payer: Self-pay | Admitting: Urology

## 2022-10-26 ENCOUNTER — Encounter (HOSPITAL_COMMUNITY): Payer: Self-pay | Admitting: Certified Registered"

## 2022-10-26 ENCOUNTER — Telehealth: Payer: Self-pay | Admitting: *Deleted

## 2022-10-26 ENCOUNTER — Ambulatory Visit (HOSPITAL_COMMUNITY)
Admission: RE | Admit: 2022-10-26 | Discharge: 2022-10-26 | Disposition: A | Discharge status: Home / Self Care | Payer: Medicare Other | Source: Ambulatory Visit | Attending: Urology | Admitting: Urology

## 2022-10-26 ENCOUNTER — Other Ambulatory Visit: Payer: Self-pay | Admitting: Urology

## 2022-10-26 VITALS — BP 155/69 | HR 75

## 2022-10-26 DIAGNOSIS — N201 Calculus of ureter: Secondary | ICD-10-CM

## 2022-10-26 DIAGNOSIS — N200 Calculus of kidney: Secondary | ICD-10-CM

## 2022-10-26 DIAGNOSIS — R109 Unspecified abdominal pain: Secondary | ICD-10-CM | POA: Diagnosis not present

## 2022-10-26 LAB — MICROSCOPIC EXAMINATION: Bacteria, UA: NONE SEEN

## 2022-10-26 LAB — URINALYSIS, ROUTINE W REFLEX MICROSCOPIC
Bilirubin, UA: NEGATIVE
Glucose, UA: NEGATIVE
Leukocytes,UA: NEGATIVE
Nitrite, UA: NEGATIVE
Specific Gravity, UA: 1.015 (ref 1.005–1.030)
Urobilinogen, Ur: 0.2 mg/dL (ref 0.2–1.0)
pH, UA: 6 (ref 5.0–7.5)

## 2022-10-26 MED ORDER — TAMSULOSIN HCL 0.4 MG PO CAPS: 0.4000 mg | ORAL_CAPSULE | Freq: Every day | ORAL | 0 refills | Status: DC

## 2022-10-26 MED ORDER — ONDANSETRON HCL 4 MG PO TABS: 4.0000 mg | ORAL_TABLET | Freq: Three times a day (TID) | ORAL | 0 refills | Status: AC | PRN

## 2022-10-26 MED ORDER — OXYCODONE-ACETAMINOPHEN 5-325 MG PO TABS: 1.0000 | ORAL_TABLET | ORAL | 0 refills | Status: DC | PRN

## 2022-10-26 NOTE — Telephone Encounter (Signed)
Pt calling back and stated could not get through to [facility]. Pt is hoping to have emergent lithotripsy. Scheduled pt for urgent tele clearance for Friday, 2/21.  Pt could not go over med list do to getting another phone call but did clarify pt's tele appt.     Patient Consent for Virtual Visit         [name] has provided verbal consent on 10/26/2022 for a virtual visit (video or telephone).   CONSENT FOR VIRTUAL VISIT FOR:  [name]  By participating in this virtual visit I agree to the following:  I hereby voluntarily request, consent and authorize [facility] and its employed or contracted physicians, physician assistants, nurse practitioners or other licensed health care professionals (the Practitioner), to provide me with telemedicine health care services (the "Services") as deemed necessary by the treating Practitioner. I acknowledge and consent to receive the Services by the Practitioner via telemedicine. I understand that the telemedicine visit will involve communicating with the Practitioner through live audiovisual communication technology and the disclosure of certain medical information by electronic transmission. I acknowledge that I have been given the opportunity to request an in-person assessment or other available alternative prior to the telemedicine visit and am voluntarily participating in the telemedicine visit.  I understand that I have the right to withhold or withdraw my consent to the use of telemedicine in the course of my care at any time, without affecting my right to future care or treatment, and that the Practitioner or I may terminate the telemedicine visit at any time. I understand that I have the right to inspect all information obtained and/or recorded in the course of the telemedicine visit and may receive copies of available information for a reasonable fee.  I understand that some of the potential risks of receiving the Services via telemedicine  include:  Delay or interruption in medical evaluation due to technological equipment failure or disruption; Information transmitted may not be sufficient (e.g. poor resolution of images) to allow for appropriate medical decision making by the Practitioner; and/or  In rare instances, security protocols could fail, causing a breach of personal health information.  Furthermore, I acknowledge that it is my responsibility to provide information about my medical history, conditions and care that is complete and accurate to the best of my ability. I acknowledge that Practitioner's advice, recommendations, and/or decision may be based on factors not within their control, such as incomplete or inaccurate data provided by me or distortions of diagnostic images or specimens that may result from electronic transmissions. I understand that the practice of medicine is not an exact science and that Practitioner makes no warranties or guarantees regarding treatment outcomes. I acknowledge that a copy of this consent can be made available to me via my patient portal ([facility]), or I can request a printed copy by calling the office of [facility].    I understand that my insurance will be billed for this visit.   I have read or had this consent read to me. I understand the contents of this consent, which adequately explains the benefits and risks of the Services being provided via telemedicine.  I have been provided ample opportunity to ask questions regarding this consent and the Services and have had my questions answered to my satisfaction. I give my informed consent for the services to be provided through the use of telemedicine in my medical care

## 2022-10-26 NOTE — Patient Instructions (Addendum)
ESWL for Kidney Stones  Extracorporeal shock wave lithotripsy (ESWL) is a treatment that can help break up kidney stones that are too large to pass on their own.  This is a nonsurgical procedure that breaks up a kidney stone with shock waves. These shock waves pass through your body and focus on the kidney stone. They cause the kidney stone to break into smaller pieces (fragments) while it is still in the urinary tract. The fragments of stone can pass more easily out of your body in the urine. Tell a health care provider about: Any allergies you have. All medicines you are taking, including vitamins, herbs, eye drops, creams, and over-the-counter medicines. Any problems you or family members have had with anesthetic medicines. Any bleeding problems you have. Any surgeries you have had. Any medical conditions you have. Whether you are pregnant or may be pregnant. What are the risks? Your health care provider will talk with you about risks. These may include: Infection. Bleeding from the kidney. Bruising of the kidney or skin. Scarring of the kidney. This can lead to: Increased blood pressure. Poor kidney function. Return (recurrence) of kidney stones. Damage to other structures or organs. This may include the liver, colon, spleen, or pancreas. Blockage (obstruction) of the tube that carries urine from the kidney to the bladder (ureter). Failure of the kidney stone to break into fragments. What happens before the procedure? When to stop eating and drinking Follow instructions from your health care provider about what you may eat and drink. These may include: 8 hours before your procedure Stop eating most foods. Do not eat meat, fried foods, or fatty foods. Eat only light foods, such as toast or crackers. All liquids are okay except energy drinks and alcohol. 6 hours before your procedure Stop eating. Drink only clear liquids, such as water, clear fruit juice, black coffee, plain tea,  and sports drinks. Do not drink energy drinks or alcohol. 2 hours before your procedure Stop drinking all liquids. You may be allowed to take medicines with small sips of water. If you do not follow your health care provider's instructions, your procedure may be delayed or canceled. Medicines Ask your health care provider about: Changing or stopping your regular medicines. These include any diabetes medicines or blood thinners you take. Taking medicines such as aspirin and ibuprofen. These medicines can thin your blood. Do not take them unless your health care provider tells you to. Taking over-the-counter medicines, vitamins, herbs, and supplements. Tests You may have tests, such as: Blood tests. Urine tests. Imaging tests. This may include a CT scan. Surgery safety Ask your health care provider: How your surgery site will be marked. What steps will be taken to help prevent infection. These steps may include: Washing skin with a soap that kills germs. Receiving antibiotics. General instructions If you will be going home right after the procedure, plan to have a responsible adult: Take you home from the hospital or clinic. You will not be allowed to drive. Care for you for the time you are told. What happens during the procedure?  An IV will be inserted into one of your veins. You may be given: A sedative. This helps you relax. Anesthesia. This will: Numb certain areas of your body. Make you fall asleep for surgery. A water-filled cushion may be placed behind your kidney or on your abdomen. In some cases, you may be placed in a tub of lukewarm water. Your body will be positioned in a way that makes it   easier to target the kidney stone. An X-ray or ultrasound exam will be done to locate your stone. Shock waves will be aimed at the stone. If you are awake, you may feel a tapping sensation as the shock waves pass through your body. A small mesh tube (stent) may be placed in your  ureter. This will help keep urine flowing from the kidney if the fragments of the stone have been blocking the ureter. The stent will be removed at a later time by your health care provider. The procedure may vary among health care providers and hospitals. What happens after the procedure? Your blood pressure, heart rate, breathing rate, and blood oxygen level will be monitored until you leave the hospital or clinic. You may have an X-ray after the procedure to see how many of the kidney stones were broken up. This will also show how much of the stone has passed. If there are still large fragments after treatment, you may need to have a second procedure at a later time. This information is not intended to replace advice given to you by your health care provider. Make sure you discuss any questions you have with your health care provider. Document Revised: 12/23/2021 Document Reviewed: 12/23/2021 Elsevier Patient Education  2023 Elsevier Inc.  

## 2022-10-26 NOTE — Telephone Encounter (Signed)
S/w pt to set up telephone clearance.  Pt stated surgery was this Friday or Monday.  Stated according to chart pt is set up in [facility] for March 1 to have surgery.  Pt stated this cannot be right and is calling [facility] to check.  Pt will call back pre-op team when he t/w [facility].

## 2022-10-26 NOTE — Telephone Encounter (Signed)
[facility] is calling to speak with you appt patients medical clearance appt

## 2022-10-26 NOTE — Telephone Encounter (Signed)
Primary Cardiologist:David Ellyn Hack, MD   Preoperative team, please contact this patient and set up a phone call appointment for further preoperative risk assessment. Please obtain consent and complete medication review. Thank you for your help.   Per office protocol, he may hold aspirin for 5 days prior to procedure pending no s/s of ACS at the time of the virtual visit and should resume as soon as hemodynamically stable postoperatively.   Emmaline Life, NP-C  10/26/2022, 4:07 PM [address], [address] Office [phone] Fax [phone]

## 2022-10-26 NOTE — Progress Notes (Signed)
10/26/2022 10:16 AM   [name] 1941/04/29 [id number]  Referring provider: [name], MD [address] [name],  [place] [address]  nephrolithiasis   HPI: Mr [name] is a 82yo here for evaluation of nephrolithiasis. Starting last week he developed sharp, severe right flank pain. He went to the Er 2/17 and was diagnosed with a 65m right proximal ureteral calculus. KUB from today shows a right ureteral calculus and the level of L3. He has nausea but no vomiting. He was discharged from the ER on medical expulsive therapy with Norco. He has had over 10 stone events in the past. 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   Frequent PVCs & fatigue -> MYOVIEW w/ inferior /r MV distribution --> CATH: only RIMA-LAD patent (LIMA atretic & SVGs occluded) --> Staged PCI of Native RCA & Cx; followed by PCI-distal RCA   CAD S/P percutaneous coronary angioplasty 8/'12; 06/'16   a) PCI- Cx-OM W/ 2 overlapping Resolute DES 3x15 & 3x18 stents (3.577m,  PCI-RCA -  Promus DES 3x38 (3.2572m; b) 6/'16: PCIWesternportS 2.75X28 (3.0 mm) -- stents patent in 01/2018;  01/2018 -stents remain patent   CAS (cerebral atherosclerosis)    CAROTID DOPPLER, 08/25/2011 - mildly abnormal   Chronic back pain    Dilated cardiomyopathy ([place] ~30-35%, 01/05/2018   11/2019 - EF ~30%, global HK with Septal dyssynergy --> 6/6/'21 - s/p CRT-P (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 unclear of etiology    Surgical History: Past Surgical History:  Procedure Laterality Date   BIV PACEMAKER INSERTION CRT-P N/A 02/10/2020   Procedure: BIV PACEMAKER INSERTION CRT-P;  Surgeon: [name];  Location: [facility] [place] [facility];  Service: Cardiovascular;  Laterality: N/A;   CARDIAC CATHETERIZATION  07/15/2004   CABG recommended; continue medical therapy   CARDIAC CATHETERIZATION  04/26/2011   Patent RIMA-LAD, atretic LIMA-Dx (previous) 100 and CTO SVG to Cx and SVG to PDA. LAD 80-90% eccentric stenosis at SP1 that has 70% stenosis. Cx: Large OM and another distal branch with 90-95% stenosis followed by lesion in the OM branch. RCA dominant 60-80% lesion proximally w/ several old lesions. PDA occluded 70-80% annular lesion in the proximal RCA. 3 of 4 grafts CTO RIMA-LAD patent   CARDIAC CATHETERIZATION N/A 02/20/2015   Procedure: Left Heart Cath and Coronary Angiography;  Surgeon: [name];  [facility]: Widely patent p-mRCA DES with dRCA 50%-PAVG 80% --> PCI. Patent overlapping stents --mCx-OM. 99% pLAD - patent RIMA-dLAD.  Known CTO SVG-rPL, SVG-OM3, :LIMA-D1.   CATARACT EXTRACTION W/ INTRAOCULAR LENS  IMPLANT, BILATERAL Bilateral ~ 2006/2007   CORONARY ANGIOPLASTY WITH STENT PLACEMENT  04/28/2011; 02/20/2015   PCI-Cx-OM 3 overlapping Resolute DES 3 mm x757m and 3x126m--> 3.57m41mPCI to RCA - Promus Element DES 3x38m53m> 3.257mm83m) dRCA-rPAV: Promus Premier DES 2.757mm 457m mm (3.0 mm)    CORONARY ARTERY BYPASS GRAFT  07/23/2004   LIMA-Cx RIMA-LAD, SVG-diagonal, SVG-PDA   RIGHT/LEFT HEART CATH  AND CORONARY/GRAFT ANGIOGRAPHY N/A 01/09/2018   Procedure: RIGHT/LEFT HEART CATH AND CORONARY/GRAFT ANGIOGRAPHY;  Surgeon: [name] Known occluded LIMA-Diag & SVG-RCA, SVG-OM.  Mod-severe pLAD that is grafted after D1 - patent RIMA- wraparound LAD, distal 1/2 of PDA territory. Patent Cx-OM DES (ost Cx mild) & p-mRCA overlapping DES & dRCA-RPAV DES.  PCWP 12 mmHg, LVP-EDP: 140/6 - 14 mmH. RAP 2 mmHg. RVP-EDP 33/0 - 5  mmHg   TRANSTHORACIC ECHOCARDIOGRAM  02/18/2015    Mild concentric LVH, EF 55-60%, no RWMA, Gr 1 DD, aortic sclerosis without stenosis   TRANSTHORACIC ECHOCARDIOGRAM  12/2017; 06/2019   (New LBBB)  EF~35% with diffuse HK of septal & lateral walls.  Mildly dilated LV.  "GR 1 "DD with high filling pressures. ? w/ normal LA size; 10/20: EF 30-35%, LBBB related Septal dyskinesis, GR2 DD (but normal LA size).    TRANSTHORACIC ECHOCARDIOGRAM  11/27/2019   (On optimal/max tolerated dose of carvedilol and Entresto): EF estimated 30% with moderate to severe decreased function and global hypokinesis.  Significant septal-lateral dyssynchrony from LBBB.  Mild LV dilation.  At least GR 1 DD.  Mildly reduced RV function but normal pressures.  Mild aortic sclerosis but no significant regurgitation.   TRANSTHORACIC ECHOCARDIOGRAM  09/14/2020   (s/p CRT-P) EF estimated 30%. Moderately decreased function with global HK. GR 1 DD. Mildly reduced RV function. Mild aortic valve thickening/sclerosis but no stenosis. (Compared to prior study, no notable change in fxn, but decreased LV r dilation with slight decrease in RV function).   TRANSURETHRAL RESECTION OF PROSTATE  ~ 2002/2003    Home Medications:  Allergies as of 10/26/2022       Reactions   Meperidine Hcl    Cold sweats, dizziness    Penicillins    Severe headaches Has patient had a PCN reaction causing immediate rash, facial/tongue/throat swelling, SOB or lightheadedness with hypotension: No Has patient had a PCN reaction causing  severe rash involving mucus membranes or skin necrosis: No Has patient had a PCN reaction that required hospitalization: No Has patient had a PCN reaction occurring within the last 10 years: No If all of the above answers are "NO", then may proceed with Cephalosporin use.        Medication List        Accurate as of October 26, 2022 10:16 AM. If you have any questions, ask your nurse or doctor.          ALPRAZolam 0.5 MG tablet Commonly known as: XANAX Take 0.5 mg by mouth 2 (two) times daily as needed for anxiety (tremors).   aspirin EC 81 MG tablet Take 81 mg by mouth daily.   bisoprolol 5 MG tablet Commonly known as: ZEBETA TAKE 1 TABLET BY MOUTH IN THE MORNING AND 1 AT BEDTIME OR  AS  DIRECTED What changed: See the new instructions.  chlorzoxazone 500 MG tablet Commonly known as: PARAFON Take 500 mg by mouth daily as needed for muscle spasms.   furosemide 20 MG tablet Commonly known as: LASIX Take 1 tablet (20 mg total) by mouth as needed for edema.   HYDROcodone-acetaminophen 5-325 MG tablet Commonly known as: NORCO/VICODIN Take 1 tablet by mouth every 6 (six) hours as needed for severe pain.   Symbicort 160-4.5 MCG/ACT inhaler Generic drug: budesonide-formoterol Inhale 2 puffs into the lungs in the morning and at bedtime.   SYSTANE OP Place 1 drop into both eyes 2 (two) times daily.   Vytorin 10-10 MG tablet Generic drug: ezetimibe-simvastatin Take 1 tablet by mouth at bedtime.        Allergies:  Allergies  Allergen Reactions   Meperidine Hcl     Cold sweats, dizziness    Penicillins     Severe headaches Has patient had a PCN reaction causing immediate rash, facial/tongue/throat swelling, SOB or lightheadedness with hypotension: No Has patient had a PCN reaction causing severe rash involving mucus membranes or skin necrosis: No Has patient had a PCN reaction that required hospitalization: No Has patient had a PCN reaction occurring within the  last 10 years: No If all of the above answers are "NO", then may proceed with Cephalosporin use.     Family History: Family History  Problem Relation Age of Onset   Heart disease Father 65   Heart disease Brother    Colon cancer Brother        and liver cancer   Stroke Mother 52   Hypertension Mother 100   Stomach cancer Sister    Hypertension Brother 28   Esophageal cancer Neg Hx    Rectal cancer Neg Hx     Social History:  reports that he quit smoking about 59 years ago. His smoking use included cigarettes. He has a 7.00 pack-year smoking history. He has never used smokeless tobacco. He reports that he does not drink alcohol and does not use drugs.  ROS: All other review of systems were reviewed and are negative except what is noted above in HPI  Physical Exam: BP (!) 155/69   Pulse 75   Constitutional:  Alert and oriented, No acute distress. HEENT: [place] AT, moist mucus membranes.  Trachea midline, no masses. Cardiovascular: No clubbing, cyanosis, or edema. Respiratory: Normal respiratory effort, no increased work of breathing. GI: Abdomen is soft, nontender, nondistended, no abdominal masses GU: No CVA tenderness.  Lymph: No cervical or inguinal lymphadenopathy. Skin: No rashes, bruises or suspicious lesions. Neurologic: Grossly intact, no focal deficits, moving all 4 extremities. Psychiatric: Normal mood and affect.  Laboratory Data: Lab Results  Component Value Date   WBC 19.8 (H) 10/22/2022   HGB 15.3 10/22/2022   HCT 46.7 10/22/2022   MCV 86.2 10/22/2022   PLT 246 10/22/2022    Lab Results  Component Value Date   CREATININE 1.06 10/22/2022    No results found for: "PSA"  No results found for: "TESTOSTERONE"  No results found for: "HGBA1C"  Urinalysis    Component Value Date/Time   COLORURINE YELLOW 10/22/2022 [place] 10/22/2022 0950   LABSPEC 1.020 10/22/2022 0950   PHURINE 5.0 10/22/2022 0950   GLUCOSEU NEGATIVE 10/22/2022 0950    HGBUR LARGE (A) 10/22/2022 0950   BILIRUBINUR NEGATIVE 10/22/2022 0950   KETONESUR NEGATIVE 10/22/2022 0950   PROTEINUR 100 (A) 10/22/2022 0950   UROBILINOGEN 1.0 03/06/2014 1406   NITRITE NEGATIVE 10/22/2022 0950   LEUKOCYTESUR NEGATIVE  10/22/2022 0950    Lab Results  Component Value Date   BACTERIA NONE SEEN 10/22/2022    Pertinent Imaging: Ct 10/22/2022 and KUB today: Images reviewed and discussed with the patient  No results found for this or any previous visit.  No results found for this or any previous visit.  No results found for this or any previous visit.  No results found for this or any previous visit.  No results found for this or any previous visit.  No valid procedures specified. No results found for this or any previous visit.  Results for orders placed during the hospital encounter of 10/22/22  CT Renal Stone Study  Narrative CLINICAL DATA:  Flank pain  EXAM: CT ABDOMEN AND PELVIS WITHOUT CONTRAST  TECHNIQUE: Multidetector CT imaging of the abdomen and pelvis was performed following the standard protocol without IV contrast.  RADIATION DOSE REDUCTION: This exam was performed according to the departmental dose-optimization program which includes automated exposure control, adjustment of the mA and/or kV according to patient size and/or use of iterative reconstruction technique.  COMPARISON:  07/01/2022  FINDINGS: Lower chest: No acute abnormality.  There are pacer wires.  Hepatobiliary: No focal liver abnormality is seen. No gallstones, gallbladder wall thickening, or biliary dilatation.  Pancreas: Unremarkable. No pancreatic ductal dilatation or surrounding inflammatory changes.  Spleen: Normal in size without focal abnormality.  Adrenals/Urinary Tract: Bilateral nephrolithiasis identified. Largest stone is 5 mm right midpole. Largest stone on the left is 4 mm mid to lower pole. There is right-sided perinephric stranding  and hydronephrosis with a proximal right ureteral stone that measures 8 mm.  Stomach/Bowel: Stomach is within normal limits. Appendix appears normal. No evidence of bowel wall thickening, distention, or inflammatory changes. Sigmoid diverticulosis identified without diverticulitis.  Vascular/Lymphatic: Aortic atherosclerosis. No enlarged abdominal or pelvic lymph nodes. Retroaortic left renal vein noted.  Reproductive: Prostate is unremarkable.  Other: No abdominal wall hernia or abnormality. No abdominopelvic ascites.  Musculoskeletal: No acute or significant osseous findings. L5-S1 disc space narrowing and sclerosis consistent with degenerative disc disease.  IMPRESSION: 1. Bilateral nephrolithiasis. 2. Proximal right ureteral 8 mm stone with obstruction. 3. Diverticulosis.   Electronically Signed By: Sammie Bench M.D. On: 10/22/2022 09:11   Assessment & Plan:    1. Right ureteral calculus -We discussed the management of kidney stones. These options include observation, ureteroscopy, shockwave lithotripsy (ESWL) and percutaneous nephrolithotomy (PCNL). We discussed which options are relevant to the patient's stone(s). We discussed the natural history of kidney stones as well as the complications of untreated stones and the impact on quality of life without treatment as well as with each of the above listed treatments. We also discussed the efficacy of each treatment in its ability to clear the stone burden. With any of these management options I discussed the signs and symptoms of infection and the need for emergent treatment should these be experienced. For each option we discussed the ability of each procedure to clear the patient of their stone burden.   For observation I described the risks which include but are not limited to silent renal damage, life-threatening infection, need for emergent surgery, failure to pass stone and pain.   For ureteroscopy I described the  risks which include bleeding, infection, damage to contiguous structures, positioning injury, ureteral stricture, ureteral avulsion, ureteral injury, need for prolonged ureteral stent, inability to perform ureteroscopy, need for an interval procedure, inability to clear stone burden, stent discomfort/pain, heart attack, stroke, pulmonary embolus and the inherent risks  with general anesthesia.   For shockwave lithotripsy I described the risks which include arrhythmia, kidney contusion, kidney hemorrhage, need for transfusion, pain, inability to adequately break up stone, inability to pass stone fragments, Steinstrasse, infection associated with obstructing stones, need for alternate surgical procedure, need for repeat shockwave lithotripsy, MI, CVA, PE and the inherent risks with anesthesia/conscious sedation.   For PCNL I described the risks including positioning injury, pneumothorax, hydrothorax, need for chest tube, inability to clear stone burden, renal laceration, arterial venous fistula or malformation, need for embolization of kidney, loss of kidney or renal function, need for repeat procedure, need for prolonged nephrostomy tube, ureteral avulsion, MI, CVA, PE and the inherent risks of general anesthesia.   - The patient would like to proceed with right ESWL - Urinalysis, Routine w reflex microscopic   No follow-ups on file.  Nicolette Bang, MD  [facility] [place]

## 2022-10-26 NOTE — Telephone Encounter (Signed)
   Pre-operative Risk Assessment    Patient Name: [name]  DOB: 1940/12/15 MRN: [id number]      Request for Surgical Clearance    Procedure:   shock wave lithotripsy   Date of Surgery:  Clearance 11/04/22                                 Surgeon:  Dr. Aggie Cosier Group or Practice Name:  [facility] Phone number:  [phone] Fax number:  [phone]   Type of Clearance Requested:   - Medical  - Pharmacy:  Hold Aspirin 3 days prior    Type of Anesthesia:  MAC   Additional requests/questions:      SignedMilbert Coulter   10/26/2022, 3:48 PM

## 2022-10-27 ENCOUNTER — Other Ambulatory Visit: Payer: Self-pay | Admitting: Urology

## 2022-10-28 ENCOUNTER — Ambulatory Visit: Payer: Medicare Other | Attending: Cardiovascular Disease | Admitting: Student

## 2022-10-28 DIAGNOSIS — Z01810 Encounter for preprocedural cardiovascular examination: Secondary | ICD-10-CM | POA: Diagnosis not present

## 2022-10-28 NOTE — Progress Notes (Signed)
Virtual Visit via Telephone Note   Because of [name]'s co-morbid illnesses, he is at least at moderate risk for complications without adequate follow up.  This format is felt to be most appropriate for this patient at this time.  The patient did not have access to video technology/had technical difficulties with video requiring transitioning to audio format only (telephone).  All issues noted in this document were discussed and addressed.  No physical exam could be performed with this format.  Please refer to the patient's chart for his consent to telehealth for [name].  Evaluation Performed:  Preoperative cardiovascular risk assessment _____________   Date:  10/28/2022   Patient ID:  [name], DOB 10/15/1940, MRN [id number] Patient Location:  Home Provider location:   Office  Primary Care Provider:  [name], MD Primary Cardiologist:  [name], MD  Chief Complaint / Patient Profile   82 y.o. 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 Alyson Ingles and presents today for telephonic preoperative cardiovascular risk assessment.  History of Present Illness    [name] is a 83 y.o. male who presents via Engineer, civil (consulting) for a telehealth visit today.  Pt was last seen in cardiology clinic on 12/06/2021 by Dr. Ellyn Hack.  At that time Odin A Trulock was doing well.  The patient is now pending procedure as outlined above. Since his last visit, he is doing well from a cardiac standpoint. Patient denies shortness of breath or dyspnea on exertion. No chest pain, pressure, or tightness. Denies lower extremity edema, orthopnea, or PND. No palpitations. He stays active around his home performing all household  duties and yard work stating "I do it all!"   Past Medical History    Past Medical History:  Diagnosis Date   Allergic rhinitis    Arthritis    "entire body" (02/19/2015)   Asthma    Asthmatic bronchitis    Biventricular cardiac pacemaker in situ 02/10/2020   [place] (Dr. 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   Frequent PVCs & fatigue -> MYOVIEW w/ inferior /r MV distribution --> CATH: only RIMA-LAD patent (LIMA atretic & SVGs occluded) --> Staged PCI of Native RCA & Cx; followed by PCI-distal RCA   CAD S/P percutaneous coronary angioplasty 8/'12; 06/'16   a) PCI- Cx-OM W/ 2 overlapping Resolute DES 3x15 & 3x18 stents (3.66m),  PCI-RCA -  Promus DES 3x38 (3.272m;; b) 6/'16: PCI-dRCA-rPAV Promus DES 2.XA:[id number] mm) -- stents patent in 01/2018;  01/2018 -stents remain patent   CAS (cerebral atherosclerosis)    CAROTID DOPPLER, 08/25/2011 - mildly abnormal   Chronic back pain    [place] ~30-35%, 01/05/2018   11/2019 - EF ~30%, global HK with Septal dyssynergy --> 6/6/'21 - s/p CRT-P ([employer])   Diverticulosis    Erectile dysfunction    Essential hypertension    Medication intolerant -- only able to take minimal doses of medications   Family hx of colon cancer    GERD (gastroesophageal reflux disease)    Gout    Hemorrhoids    Hyperlipidemia with target LDL less than 70    Only able to tolerate very low dose statin -  no interest in other options   Kidney stones    Myocardial infarction ([place]) 2005   Polycythemia    S/P CABG x 4 07/2004   Cath for Fatigue & PVCs ==> MV CAD ==> CABG: pRIMA-LAD, pLIMA-LCx, SVG-D1, SVG-rPDA --> ONLY pRIMA-LAD patent   Symptomatic PVCs    As the main symptom for angina   Syncope 2010   ? unclear of etiology   Past Surgical History:  Procedure Laterality Date   BIV PACEMAKER INSERTION CRT-P N/A 02/10/2020   Procedure: BIV PACEMAKER INSERTION CRT-P;  Surgeon: Constance Haw,  MD;  Location: [place] [facility];  Service: Cardiovascular;  Laterality: N/A;   CARDIAC CATHETERIZATION  07/15/2004   CABG recommended; continue medical therapy   CARDIAC CATHETERIZATION  04/26/2011   Patent RIMA-LAD, atretic LIMA-Dx (previous) 100 and CTO SVG to Cx and SVG to PDA. LAD 80-90% eccentric stenosis at SP1 that has 70% stenosis. Cx: Large OM and another distal branch with 90-95% stenosis followed by lesion in the OM branch. RCA dominant 60-80% lesion proximally w/ several old lesions. PDA occluded 70-80% annular lesion in the proximal RCA. 3 of 4 grafts CTO RIMA-LAD patent   CARDIAC CATHETERIZATION N/A 02/20/2015   Procedure: Left Heart Cath and Coronary Angiography;  Surgeon: Leonie Man, MD;  Location: [place] [facility]: Widely patent p-mRCA DES with dRCA 50%-PAVG 80% --> PCI. Patent overlapping stents --mCx-OM. 99% pLAD - patent RIMA-dLAD.  Known CTO SVG-rPL, SVG-OM3, :LIMA-D1.   CATARACT EXTRACTION W/ INTRAOCULAR LENS  IMPLANT, BILATERAL Bilateral ~ 2006/2007   CORONARY ANGIOPLASTY WITH STENT PLACEMENT  04/28/2011; 02/20/2015   PCI-Cx-OM 3 overlapping Resolute DES 3 mm x60m and 3x168m--> 3.26m41mPCI to RCA - Promus Element DES 3x38m62m> 3.226mm37m) dRCA-rPAV: Promus Premier DES 2.726mm 42m mm (3.0 mm)    CORONARY ARTERY BYPASS GRAFT  07/23/2004   LIMA-Cx RIMA-LAD, SVG-diagonal, SVG-PDA   RIGHT/LEFT HEART CATH AND CORONARY/GRAFT ANGIOGRAPHY N/A 01/09/2018   Procedure: RIGHT/LEFT HEART CATH AND CORONARY/GRAFT ANGIOGRAPHY;  Surgeon: HardinLeonie Man Known occluded LIMA-Diag & SVG-RCA, SVG-OM.  Mod-severe pLAD that is grafted after D1 - patent RIMA- wraparound LAD, distal 1/2 of PDA territory. Patent Cx-OM DES (ost Cx mild) & p-mRCA overlapping DES & dRCA-RPAV DES.  PCWP 12 mmHg, LVP-EDP: 140/6 - 14 mmH. RAP 2 mmHg. RVP-EDP 33/0 - 5  mmHg   TRANSTHORACIC ECHOCARDIOGRAM  02/18/2015    Mild concentric LVH, EF 55-60%, no RWMA, Gr 1 DD, aortic sclerosis without stenosis    TRANSTHORACIC ECHOCARDIOGRAM  12/2017; 06/2019   (New LBBB)  EF~35% with diffuse HK of septal & lateral walls.  Mildly dilated LV.  "GR 1 "DD with high filling pressures. ? w/ normal LA size; 10/20: EF 30-35%, LBBB related Septal dyskinesis, GR2 DD (but normal LA size).    TRANSTHORACIC ECHOCARDIOGRAM  11/27/2019   (On optimal/max tolerated dose of carvedilol and Entresto): EF estimated 30% with moderate to severe decreased function and global hypokinesis.  Significant septal-lateral dyssynchrony from LBBB.  Mild LV dilation.  At least GR 1 DD.  Mildly reduced RV function but normal pressures.  Mild aortic sclerosis but no significant regurgitation.   TRANSTHORACIC ECHOCARDIOGRAM  09/14/2020   (s/p CRT-P) EF estimated 30%. Moderately decreased function with global HK. GR 1 DD. Mildly reduced RV function. Mild aortic valve thickening/sclerosis but no stenosis. (Compared to prior study, no notable change in fxn, but decreased LV r dilation with slight decrease in RV function).   TRANSURETHRAL RESECTION OF  PROSTATE  ~ 2002/2003    Allergies  Allergies  Allergen Reactions   Meperidine Hcl     Cold sweats, dizziness    Penicillins     Severe headaches Has patient had a PCN reaction causing immediate rash, facial/tongue/throat swelling, SOB or lightheadedness with hypotension: No Has patient had a PCN reaction causing severe rash involving mucus membranes or skin necrosis: No Has patient had a PCN reaction that required hospitalization: No Has patient had a PCN reaction occurring within the last 10 years: No If all of the above answers are "NO", then may proceed with Cephalosporin use.     Home Medications    Prior to Admission medications   Medication Sig Start Date End Date Taking? Authorizing Provider  ALPRAZolam Duanne Moron) 0.5 MG tablet Take 0.5 mg by mouth 2 (two) times daily as needed for anxiety (tremors).    [provider]  aspirin EC 81 MG tablet Take 81 mg by mouth daily.      [provider]  bisoprolol (ZEBETA) 5 MG tablet TAKE 1 TABLET BY MOUTH IN THE MORNING AND 1 AT BEDTIME OR  AS  DIRECTED Patient taking differently: Take 5 mg by mouth in the morning and at bedtime. 07/04/22   Leonie Man, MD  chlorzoxazone (PARAFON) 500 MG tablet Take 500 mg by mouth daily as needed for muscle spasms.    [provider]  ezetimibe-simvastatin (VYTORIN) 10-10 MG tablet Take 1 tablet by mouth at bedtime.    [provider]  furosemide (LASIX) 20 MG tablet Take 1 tablet (20 mg total) by mouth as needed for edema. 09/03/18   Leonie Man, MD  HYDROcodone-acetaminophen (NORCO/VICODIN) 5-325 MG tablet Take 1 tablet by mouth every 6 (six) hours as needed for severe pain. 10/22/22   Carmin Muskrat, MD  ondansetron (ZOFRAN) 4 MG tablet Take 1 tablet (4 mg total) by mouth every 8 (eight) hours as needed for nausea or vomiting. 10/26/22   McKenzie, Candee Furbish, MD  oxyCODONE-acetaminophen (PERCOCET) 5-325 MG tablet Take 1 tablet by mouth every 4 (four) hours as needed. 10/26/22   McKenzie, Candee Furbish, MD  Polyethyl Glycol-Propyl Glycol (SYSTANE OP) Place 1 drop into both eyes 2 (two) times daily.    [provider]  SYMBICORT 160-4.5 MCG/ACT inhaler Inhale 2 puffs into the lungs in the morning and at bedtime. 08/22/22   [provider]  tamsulosin (FLOMAX) 0.4 MG CAPS capsule Take 1 capsule (0.4 mg total) by mouth daily after supper. 10/26/22   McKenzie, Candee Furbish, MD    Physical Exam    Vital Signs:  Kareem A Goodchild does not have vital signs available for review today.  Given telephonic nature of communication, physical exam is limited. AAOx3. NAD. Normal affect.  Speech and respirations are unlabored.  Accessory Clinical Findings    None  Assessment & Plan    Primary Cardiologist: [name], MD  Preoperative cardiovascular risk assessment. Shockwave lithotripsy.   Chart reviewed as part of pre-operative protocol coverage.  According to the RCRI, patient has a 6.6% risk of MACE. Patient reports activity equivalent to >4.0 METS (moderate housework and yard work to include weeding, raking and mowing).   Given past medical history and time since last visit, based on ACC/AHA guidelines, Finbar A Quinney would be at acceptable risk for the planned procedure without further cardiovascular testing.   Patient was advised that if he develops new symptoms prior to surgery to contact our office to arrange a follow-up appointment.  he verbalized understanding.  Regarding ASA therapy, we recommend continuation of ASA throughout the perioperative period.  However, if the surgeon feels that cessation of ASA is required in the perioperative period, it may be stopped 5-7 days prior to surgery with a plan to resume it as soon as felt to be feasible from a surgical standpoint in the post-operative period.  I will route this recommendation to the requesting party via Epic fax function.  Please call with questions.  Time:   Today, I have spent 6 minutes with the patient with telehealth technology discussing medical history, symptoms, and management plan.     Mayra Reel, NP  10/28/2022, 8:58 AM

## 2022-10-31 ENCOUNTER — Encounter (HOSPITAL_BASED_OUTPATIENT_CLINIC_OR_DEPARTMENT_OTHER): Payer: Self-pay | Admitting: Urology

## 2022-10-31 NOTE — Telephone Encounter (Signed)
I s/w Selita today about the pt, see pre op notes

## 2022-10-31 NOTE — Telephone Encounter (Signed)
S/w Selita from requesting office, who stated that the they are needing device clearance , but had not heard back yet about this. Will also need an EKG if not done within 6 months prior. I have scheduled the pt to come in tomorrow at 3 pm for EKG. I will send message to device clinic about device clearance. I will send FYI to [facility].

## 2022-10-31 NOTE — Progress Notes (Signed)
Talked with patient. Instructions given to pt and wife. Arrival time 1030 NPO after MN. May take AM meds with sip of water. Hx and meds reviewed. Patient relayed to nurse that he has had a fever. Informed to call [facility] To let them know. Bring in blue folder when they come

## 2022-11-01 ENCOUNTER — Ambulatory Visit: Payer: Medicare Other

## 2022-11-01 ENCOUNTER — Telehealth: Payer: Self-pay | Admitting: Cardiology

## 2022-11-01 DIAGNOSIS — U071 COVID-19: Secondary | ICD-10-CM | POA: Diagnosis not present

## 2022-11-01 NOTE — Telephone Encounter (Signed)
Patient called to cancel his nurse visit, will call back later to reshd.  Please advise

## 2022-11-01 NOTE — Telephone Encounter (Signed)
Selita, called and left vm pt has COVID and his procedure has been cancelled. Selita, states they will let us know when procedure has been rescheduled if still needed.

## 2022-11-01 NOTE — Telephone Encounter (Signed)
I had a phone visit with this patient 2/23. He may be trying to get in touch with the urology office.  Thanks!  DW

## 2022-11-01 NOTE — Telephone Encounter (Signed)
Pt called our office today to cancel his EKG that he was needing for pre op clearance per the surgeon office. They needed an EKG that was not more than 6 months old from the date of surgery. I will update the requesting office as FYI.

## 2022-11-02 NOTE — Telephone Encounter (Signed)
Called pt to see if EKG is still needed prior to procedure.  Pt reports has COVID and will call back to reschedule appointment once feeling better. Reports procedure will be in around 3 more weeks.  Pt needs to schedule an OV with Dr. Ellyn Hack will send  message to scheduling to f/u.

## 2022-11-03 ENCOUNTER — Telehealth: Payer: Self-pay

## 2022-11-03 DIAGNOSIS — N200 Calculus of kidney: Secondary | ICD-10-CM

## 2022-11-03 NOTE — Telephone Encounter (Signed)
Reviewed with Dr. [name].  Appears atrial driven, will continue to monitor.  Patient made aware.

## 2022-11-03 NOTE — Telephone Encounter (Signed)
Device alert for HVR, duration 54 sec, HR 176 EGM shows regular 1:1, atrial driven 1 AMS, [id number] in duration Route to triage [place] with patient.  He has active COVID infection just dx 2-3 days ago and is on Paxlovid.  He is experiencing chest congestion/diarrhea/fatigue and weakness and is following his PCP for this.  I did inform him that if he is not improving or getting worse with his symptoms, he should f/u with his primary care doctor.   Will forward to Dr. [name] for review and if anything further at this point.  Other than this episode, overall, ventricular rates appear controlled.

## 2022-11-03 NOTE — Telephone Encounter (Signed)
Received email from Surgery scheduler from [facility].  "Ileene Musa,    Dr. Noland Fordyce patient [name] dob: 1940/12/12 tested positive for COVID today so his ESWL was cancelled.  Per the new guidelines, the patient can be rescheduled 2 weeks after his symptoms have resolved. The patient states he would like to have someone from your office call him next week and schedule him for another x-ray prior to ESWL being rescheduled.  Thanks.  Se'lita"  I called patient to let patient know xray order placed. No answer. Left message to return call to office

## 2022-11-04 ENCOUNTER — Telehealth: Payer: Self-pay

## 2022-11-04 ENCOUNTER — Encounter (HOSPITAL_BASED_OUTPATIENT_CLINIC_OR_DEPARTMENT_OTHER): Admission: RE | Payer: Self-pay | Source: Home / Self Care

## 2022-11-04 ENCOUNTER — Ambulatory Visit (HOSPITAL_BASED_OUTPATIENT_CLINIC_OR_DEPARTMENT_OTHER): Admission: RE | Admit: 2022-11-04 | Payer: Medicare Other | Source: Home / Self Care | Admitting: Urology

## 2022-11-04 HISTORY — DX: Presence of cardiac pacemaker: Z95.0

## 2022-11-04 SURGERY — LITHOTRIPSY, ESWL
Anesthesia: Monitor Anesthesia Care | Laterality: Right

## 2022-11-04 NOTE — Telephone Encounter (Signed)
Patient is aware that another X-ray is scheduled and patient need to wait two weeks after symptoms resolved. Patient voiced understanding

## 2022-11-08 ENCOUNTER — Telehealth: Payer: Self-pay

## 2022-11-08 ENCOUNTER — Other Ambulatory Visit: Payer: Self-pay | Admitting: Urology

## 2022-11-08 ENCOUNTER — Ambulatory Visit (HOSPITAL_COMMUNITY)
Admission: RE | Admit: 2022-11-08 | Discharge: 2022-11-08 | Disposition: A | Discharge status: Home / Self Care | Payer: Medicare Other | Source: Ambulatory Visit | Attending: Urology | Admitting: Urology

## 2022-11-08 ENCOUNTER — Ambulatory Visit (INDEPENDENT_AMBULATORY_CARE_PROVIDER_SITE_OTHER): Payer: Medicare Other | Admitting: Urology

## 2022-11-08 VITALS — BP 154/78 | HR 67

## 2022-11-08 DIAGNOSIS — R109 Unspecified abdominal pain: Secondary | ICD-10-CM | POA: Diagnosis not present

## 2022-11-08 DIAGNOSIS — N50819 Testicular pain, unspecified: Secondary | ICD-10-CM | POA: Diagnosis not present

## 2022-11-08 DIAGNOSIS — N390 Urinary tract infection, site not specified: Secondary | ICD-10-CM | POA: Diagnosis not present

## 2022-11-08 DIAGNOSIS — N200 Calculus of kidney: Secondary | ICD-10-CM

## 2022-11-08 DIAGNOSIS — N201 Calculus of ureter: Secondary | ICD-10-CM

## 2022-11-08 DIAGNOSIS — N202 Calculus of kidney with calculus of ureter: Secondary | ICD-10-CM | POA: Diagnosis not present

## 2022-11-08 MED ORDER — HYDROCODONE-ACETAMINOPHEN 5-325 MG PO TABS: 1.0000 | ORAL_TABLET | Freq: Four times a day (QID) | ORAL | 0 refills | Status: DC | PRN

## 2022-11-08 NOTE — H&P (View-Only) (Signed)
History of Present Illness: This man comes in today as a walk-in with flank and right testicle pain.  He says he feels like his bladder is on fire.  ESL of right proximal ureteral stone was canceled last week due to the patient having COVID.  He is running low on his hydrocodone.  He says that the oxycodone is too strong.  He has had no fever or chills. 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 Camnitz)   CAD of autologous vein bypass graft without angina 04/2011   Frequent PVCs & fatigue -> MYOVIEW w/ inferior /r MV distribution --> CATH: only RIMA-LAD patent (LIMA atretic & SVGs occluded) --> Staged PCI of Native RCA & Cx; followed by PCI-distal RCA   CAD S/P percutaneous coronary angioplasty 8/'12; 06/'16   a) PCI- Cx-OM W/ 2 overlapping Resolute DES 3x15 & 3x18 stents (3.5mm),  PCI-RCA -  Promus DES 3x38 (3.25mm);; b) 6/'16: PCI-dRCA-rPAV Promus DES 2.75X28 (3.0 mm) -- stents patent in 01/2018;  01/2018 -stents remain patent   CAS (cerebral atherosclerosis)    CAROTID DOPPLER, 08/25/2011 - mildly abnormal   Chronic back pain    Dilated cardiomyopathy (HCC): EF ~30-35%, 01/05/2018   11/2019 - EF ~30%, global HK with Septal dyssynergy --> 6/6/'21 - s/p CRT-P ([employer])   Diverticulosis    Erectile dysfunction    Essential hypertension    Medication intolerant -- only able to take minimal doses of medications   Family hx of colon cancer    GERD (gastroesophageal reflux disease)    Gout    Hemorrhoids    Hyperlipidemia with target LDL less than 70    Only able to tolerate very low dose statin - no interest in other options   Kidney stones    Myocardial infarction ([facility]) 2005   Polycythemia    Presence of permanent cardiac pacemaker    S/P CABG x 4 07/2004   Cath for Fatigue & PVCs ==> MV CAD ==> CABG:  pRIMA-LAD, pLIMA-LCx, SVG-D1, SVG-rPDA --> ONLY pRIMA-LAD patent   Symptomatic PVCs    As the main symptom for angina   Syncope 2010   ? unclear of etiology    Past Surgical History:  Procedure Laterality Date   BIV PACEMAKER INSERTION CRT-P N/A 02/10/2020   Procedure: BIV PACEMAKER INSERTION CRT-P;  Surgeon: Camnitz, Will Martin, MD;  Location: [facility];  Service: Cardiovascular;  Laterality: N/A;   CARDIAC CATHETERIZATION  07/15/2004   CABG recommended; continue medical therapy   CARDIAC CATHETERIZATION  04/26/2011   Patent RIMA-LAD, atretic LIMA-Dx (previous) 100 and CTO SVG to Cx and SVG to PDA. LAD 80-90% eccentric stenosis at SP1 that has 70% stenosis. Cx: Large OM and another distal branch with 90-95% stenosis followed by lesion in the OM branch. RCA dominant 60-80% lesion proximally w/ several old lesions. PDA occluded 70-80% annular lesion in the proximal RCA. 3 of 4 grafts CTO RIMA-LAD patent   CARDIAC CATHETERIZATION N/A 02/20/2015   Procedure: Left Heart Cath and Coronary Angiography;  Surgeon: David W Harding, MD;  Location: [facility]: Widely patent p-mRCA DES with dRCA 50%-PAVG 80% --> PCI. Patent overlapping stents --mCx-OM. 99% pLAD - patent RIMA-dLAD.  Known CTO SVG-rPL, SVG-OM3, :LIMA-D1.   CATARACT EXTRACTION W/ INTRAOCULAR LENS  IMPLANT,   BILATERAL Bilateral ~ 2006/2007   CORONARY ANGIOPLASTY WITH STENT PLACEMENT  04/28/2011; 02/20/2015   PCI-Cx-OM 3 overlapping Resolute DES 3 mm x15mm and 3x18mm --> 3.5mm; PCI to RCA - Promus Element DES 3x38mm --> 3.25mm;; b) dRCA-rPAV: Promus Premier DES 2.75mm X 28 mm (3.0 mm)    CORONARY ARTERY BYPASS GRAFT  07/23/2004   LIMA-Cx RIMA-LAD, SVG-diagonal, SVG-PDA   RIGHT/LEFT HEART CATH AND CORONARY/GRAFT ANGIOGRAPHY N/A 01/09/2018   Procedure: RIGHT/LEFT HEART CATH AND CORONARY/GRAFT ANGIOGRAPHY;  Surgeon: Harding, David W, MD;; Known occluded LIMA-Diag & SVG-RCA, SVG-OM.  Mod-severe pLAD that is grafted after D1 - patent  RIMA- wraparound LAD, distal 1/2 of PDA territory. Patent Cx-OM DES (ost Cx mild) & p-mRCA overlapping DES & dRCA-RPAV DES.  PCWP 12 mmHg, LVP-EDP: 140/6 - 14 mmH. RAP 2 mmHg. RVP-EDP 33/0 - 5  mmHg   TRANSTHORACIC ECHOCARDIOGRAM  02/18/2015    Mild concentric LVH, EF 55-60%, no RWMA, Gr 1 DD, aortic sclerosis without stenosis   TRANSTHORACIC ECHOCARDIOGRAM  12/2017; 06/2019   (New LBBB)  EF~35% with diffuse HK of septal & lateral walls.  Mildly dilated LV.  "GR 1 "DD with high filling pressures. ? w/ normal LA size; 10/20: EF 30-35%, LBBB related Septal dyskinesis, GR2 DD (but normal LA size).    TRANSTHORACIC ECHOCARDIOGRAM  11/27/2019   (On optimal/max tolerated dose of carvedilol and Entresto): EF estimated 30% with moderate to severe decreased function and global hypokinesis.  Significant septal-lateral dyssynchrony from LBBB.  Mild LV dilation.  At least GR 1 DD.  Mildly reduced RV function but normal pressures.  Mild aortic sclerosis but no significant regurgitation.   TRANSTHORACIC ECHOCARDIOGRAM  09/14/2020   (s/p CRT-P) EF estimated 30%. Moderately decreased function with global HK. GR 1 DD. Mildly reduced RV function. Mild aortic valve thickening/sclerosis but no stenosis. (Compared to prior study, no notable change in fxn, but decreased LV r dilation with slight decrease in RV function).   TRANSURETHRAL RESECTION OF PROSTATE  ~ 2002/2003    Home Medications:  Allergies as of 11/08/2022       Reactions   Meperidine Hcl    Cold sweats, dizziness    Penicillins    Severe headaches Has patient had a PCN reaction causing immediate rash, facial/tongue/throat swelling, SOB or lightheadedness with hypotension: No Has patient had a PCN reaction causing severe rash involving mucus membranes or skin necrosis: No Has patient had a PCN reaction that required hospitalization: No Has patient had a PCN reaction occurring within the last 10 years: No If all of the above answers are "NO", then may  proceed with Cephalosporin use.        Medication List        Accurate as of November 08, 2022  5:01 PM. If you have any questions, ask your nurse or doctor.          ALPRAZolam 0.5 MG tablet Commonly known as: XANAX Take 0.5 mg by mouth 2 (two) times daily as needed for anxiety (tremors).   aspirin EC 81 MG tablet Take 81 mg by mouth daily.   bisoprolol 5 MG tablet Commonly known as: ZEBETA TAKE 1 TABLET BY MOUTH IN THE MORNING AND 1 AT BEDTIME OR  AS  DIRECTED What changed: See the new instructions.   chlorzoxazone 500 MG tablet Commonly known as: PARAFON Take 500 mg by mouth daily as needed for muscle spasms.   furosemide 20 MG tablet Commonly known as: LASIX Take 1 tablet (20 mg total) by mouth   as needed for edema.   HYDROcodone-acetaminophen 5-325 MG tablet Commonly known as: NORCO/VICODIN Take 1 tablet by mouth every 6 (six) hours as needed for severe pain.   ondansetron 4 MG tablet Commonly known as: Zofran Take 1 tablet (4 mg total) by mouth every 8 (eight) hours as needed for nausea or vomiting.   oxyCODONE-acetaminophen 5-325 MG tablet Commonly known as: Percocet Take 1 tablet by mouth every 4 (four) hours as needed.   Symbicort 160-4.5 MCG/ACT inhaler Generic drug: budesonide-formoterol Inhale 2 puffs into the lungs in the morning and at bedtime.   SYSTANE OP Place 1 drop into both eyes 2 (two) times daily.   tamsulosin 0.4 MG Caps capsule Commonly known as: FLOMAX Take 1 capsule (0.4 mg total) by mouth daily after supper.   Vytorin 10-10 MG tablet Generic drug: ezetimibe-simvastatin Take 1 tablet by mouth at bedtime.        Allergies:  Allergies  Allergen Reactions   Meperidine Hcl     Cold sweats, dizziness    Penicillins     Severe headaches Has patient had a PCN reaction causing immediate rash, facial/tongue/throat swelling, SOB or lightheadedness with hypotension: No Has patient had a PCN reaction causing severe rash involving  mucus membranes or skin necrosis: No Has patient had a PCN reaction that required hospitalization: No Has patient had a PCN reaction occurring within the last 10 years: No If all of the above answers are "NO", then may proceed with Cephalosporin use.     Family History  Problem Relation Age of Onset   Heart disease Father 68   Heart disease Brother    Colon cancer Brother        and liver cancer   Stroke Mother 89   Hypertension Mother 49   Stomach cancer Sister    Hypertension Brother 14   Esophageal cancer Neg Hx    Rectal cancer Neg Hx     Social History:  reports that he quit smoking about 59 years ago. His smoking use included cigarettes. He has a 7.00 pack-year smoking history. He has never used smokeless tobacco. He reports that he does not drink alcohol and does not use drugs.  ROS: A complete review of systems was performed.  All systems are negative except for pertinent findings as noted.  Physical Exam:  Vital signs in last 24 hours: BP (!) 154/78   Pulse 67  Constitutional:  Alert and oriented, No acute distress Cardiovascular: Regular rate  Respiratory: Normal respiratory effort Neurologic: Grossly intact, no focal deficits Psychiatric: Normal mood and affect  I have reviewed prior pt notes  I have reviewed urinalysis results  I have independently reviewed prior imaging--CT scan and most recent KUB reviewed   Impression/Assessment:  Right proximal ureteral stone-lithotripsy canceled because of COVID.  He has had treatment and is less symptomatic now  Plan:  I gave him a refill of his hydrocodone  I sent him out for KUB today  We will work at scheduling/rescheduling his shockwave lithotripsy.  

## 2022-11-08 NOTE — Progress Notes (Signed)
Patient came in to drop off urine. Patient states his bladder is on fire and having testicle pain. Patient was moved  from nurse visit to office visit. Urine culture pending

## 2022-11-08 NOTE — Progress Notes (Signed)
History of Present Illness: This man comes in today as a walk-in with flank and right testicle pain.  He says he feels like his bladder is on fire.  ESL of right proximal ureteral stone was canceled last week due to the patient having COVID.  He is running low on his hydrocodone.  He says that the oxycodone is too strong.  He has had no fever or chills. 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   Frequent PVCs & fatigue -> MYOVIEW w/ inferior /r MV distribution --> CATH: only RIMA-LAD patent (LIMA atretic & SVGs occluded) --> Staged PCI of Native RCA & Cx; followed by PCI-distal RCA   CAD S/P percutaneous coronary angioplasty 8/'12; 06/'16   a) PCI- Cx-OM W/ 2 overlapping Resolute DES 3x15 & 3x18 stents (3.18m),  PCI-RCA -  Promus DES 3x38 (3.254m;; b) 6/'16[place]3.0 mm[place] -- stents patent in 01/2018;  01/2018 -stents remain patent   CAS (cerebral atherosclerosis)    CAROTID DOPPLER, 08/25/2011 - mildly abnormal   Chronic back pain    Dilated cardiomyopathy ([place] EF ~30-35%, 01/05/2018   11/2019 - EF ~30%, global HK with Septal dyssynergy --> 6/6/'21 - s/p CRT-P (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 unclear of etiology    Past Surgical History:  Procedure Laterality Date   BIV PACEMAKER INSERTION CRT-P N/A 02/10/2020   Procedure: BIV PACEMAKER INSERTION CRT-P;  Surgeon: CaConstance HawMD;  [facility];  Service: Cardiovascular;  Laterality: N/A;   CARDIAC CATHETERIZATION  07/15/2004   CABG recommended; continue medical therapy   CARDIAC CATHETERIZATION  04/26/2011   Patent RIMA-LAD, atretic LIMA-Dx (previous) 100 and CTO SVG to Cx and SVG to PDA. LAD 80-90% eccentric stenosis at SP1 that has 70% stenosis. Cx: Large OM and another distal branch with 90-95% stenosis followed by lesion in the OM branch. RCA dominant 60-80% lesion proximally w/ several old lesions. PDA occluded 70-80% annular lesion in the proximal RCA. 3 of 4 grafts CTO RIMA-LAD patent   CARDIAC CATHETERIZATION N/A 02/20/2015   Procedure: Left Heart Cath and Coronary Angiography;  Surgeon: DaLeonie ManMD;  [facility]: Widely patent p-mRCA DES with dRCA 50%-PAVG 80% --> PCI. Patent overlapping stents --mCx-OM. 99% pLAD - patent RIMA-dLAD.  Known CTO SVG-rPL, SVG-OM3, :LIMA-D1.   CATARACT EXTRACTION W/ INTRAOCULAR LENS  IMPLANT,  BILATERAL Bilateral ~ 2006/2007   CORONARY ANGIOPLASTY WITH STENT PLACEMENT  04/28/2011; 02/20/2015   PCI-Cx-OM 3 overlapping Resolute DES 3 mm x60m and 3x12m--> 3.62m72mPCI to RCA - Promus Element DES 3x38m862m> 3.262mm60m) dRCA-rPAV: Promus Premier DES 2.762mm 36m mm (3.0 mm)    CORONARY ARTERY BYPASS GRAFT  07/23/2004   LIMA-Cx RIMA-LAD, SVG-diagonal, SVG-PDA   RIGHT/LEFT HEART CATH AND CORONARY/GRAFT ANGIOGRAPHY N/A 01/09/2018   Procedure: RIGHT/LEFT HEART CATH AND CORONARY/GRAFT ANGIOGRAPHY;  Surgeon: HardinLeonie Man Known occluded LIMA-Diag & SVG-RCA, SVG-OM.  Mod-severe pLAD that is grafted after D1 - patent  RIMA- wraparound LAD, distal 1/2 of PDA territory. Patent Cx-OM DES (ost Cx mild) & p-mRCA overlapping DES & dRCA-RPAV DES.  PCWP 12 mmHg, LVP-EDP: 140/6 - 14 mmH. RAP 2 mmHg. RVP-EDP 33/0 - 5  mmHg   TRANSTHORACIC ECHOCARDIOGRAM  02/18/2015    Mild concentric LVH, EF 55-60%, no RWMA, Gr 1 DD, aortic sclerosis without stenosis   TRANSTHORACIC ECHOCARDIOGRAM  12/2017; 06/2019   (New LBBB)  EF~35% with diffuse HK of septal & lateral walls.  Mildly dilated LV.  "GR 1 "DD with high filling pressures. ? w/ normal LA size; 10/20: EF 30-35%, LBBB related Septal dyskinesis, GR2 DD (but normal LA size).    TRANSTHORACIC ECHOCARDIOGRAM  11/27/2019   (On optimal/max tolerated dose of carvedilol and Entresto): EF estimated 30% with moderate to severe decreased function and global hypokinesis.  Significant septal-lateral dyssynchrony from LBBB.  Mild LV dilation.  At least GR 1 DD.  Mildly reduced RV function but normal pressures.  Mild aortic sclerosis but no significant regurgitation.   TRANSTHORACIC ECHOCARDIOGRAM  09/14/2020   (s/p CRT-P) EF estimated 30%. Moderately decreased function with global HK. GR 1 DD. Mildly reduced RV function. Mild aortic valve thickening/sclerosis but no stenosis. (Compared to prior study, no notable change in fxn, but decreased LV r dilation with slight decrease in RV function).   TRANSURETHRAL RESECTION OF PROSTATE  ~ 2002/2003    Home Medications:  Allergies as of 11/08/2022       Reactions   Meperidine Hcl    Cold sweats, dizziness    Penicillins    Severe headaches Has patient had a PCN reaction causing immediate rash, facial/tongue/throat swelling, SOB or lightheadedness with hypotension: No Has patient had a PCN reaction causing severe rash involving mucus membranes or skin necrosis: No Has patient had a PCN reaction that required hospitalization: No Has patient had a PCN reaction occurring within the last 10 years: No If all of the above answers are "NO", then may  proceed with Cephalosporin use.        Medication List        Accurate as of November 08, 2022  5:01 PM. If you have any questions, ask your nurse or doctor.          ALPRAZolam 0.5 MG tablet Commonly known as: XANAX Take 0.5 mg by mouth 2 (two) times daily as needed for anxiety (tremors).   aspirin EC 81 MG tablet Take 81 mg by mouth daily.   bisoprolol 5 MG tablet Commonly known as: ZEBETA TAKE 1 TABLET BY MOUTH IN THE MORNING AND 1 AT BEDTIME OR  AS  DIRECTED What changed: See the new instructions.   chlorzoxazone 500 MG tablet Commonly known as: PARAFON Take 500 mg by mouth daily as needed for muscle spasms.   furosemide 20 MG tablet Commonly known as: LASIX Take 1 tablet (20 mg total) by mouth  as needed for edema.   HYDROcodone-acetaminophen 5-325 MG tablet Commonly known as: NORCO/VICODIN Take 1 tablet by mouth every 6 (six) hours as needed for severe pain.   ondansetron 4 MG tablet Commonly known as: Zofran Take 1 tablet (4 mg total) by mouth every 8 (eight) hours as needed for nausea or vomiting.   oxyCODONE-acetaminophen 5-325 MG tablet Commonly known as: Percocet Take 1 tablet by mouth every 4 (four) hours as needed.   Symbicort 160-4.5 MCG/ACT inhaler Generic drug: budesonide-formoterol Inhale 2 puffs into the lungs in the morning and at bedtime.   SYSTANE OP Place 1 drop into both eyes 2 (two) times daily.   tamsulosin 0.4 MG Caps capsule Commonly known as: FLOMAX Take 1 capsule (0.4 mg total) by mouth daily after supper.   Vytorin 10-10 MG tablet Generic drug: ezetimibe-simvastatin Take 1 tablet by mouth at bedtime.        Allergies:  Allergies  Allergen Reactions   Meperidine Hcl     Cold sweats, dizziness    Penicillins     Severe headaches Has patient had a PCN reaction causing immediate rash, facial/tongue/throat swelling, SOB or lightheadedness with hypotension: No Has patient had a PCN reaction causing severe rash involving  mucus membranes or skin necrosis: No Has patient had a PCN reaction that required hospitalization: No Has patient had a PCN reaction occurring within the last 10 years: No If all of the above answers are "NO", then may proceed with Cephalosporin use.     Family History  Problem Relation Age of Onset   Heart disease Father 79   Heart disease Brother    Colon cancer Brother        and liver cancer   Stroke Mother 6   Hypertension Mother 20   Stomach cancer Sister    Hypertension Brother 52   Esophageal cancer Neg Hx    Rectal cancer Neg Hx     Social History:  reports that he quit smoking about 59 years ago. His smoking use included cigarettes. He has a 7.00 pack-year smoking history. He has never used smokeless tobacco. He reports that he does not drink alcohol and does not use drugs.  ROS: A complete review of systems was performed.  All systems are negative except for pertinent findings as noted.  Physical Exam:  Vital signs in last 24 hours: BP (!) 154/78   Pulse 67  Constitutional:  Alert and oriented, No acute distress Cardiovascular: Regular rate  Respiratory: Normal respiratory effort Neurologic: Grossly intact, no focal deficits Psychiatric: Normal mood and affect  I have reviewed prior pt notes  I have reviewed urinalysis results  I have independently reviewed prior imaging--CT scan and most recent KUB reviewed   Impression/Assessment:  Right proximal ureteral stone-lithotripsy canceled because of COVID.  He has had treatment and is less symptomatic now  Plan:  I gave him a refill of his hydrocodone  I sent him out for KUB today  We will work at scheduling/rescheduling his shockwave lithotripsy.

## 2022-11-08 NOTE — Telephone Encounter (Signed)
Patient called advising that he has a hx of UTI's and is currently having frequent urination along with dysuria. Patient advised he would like to come by the office and leave a urine specimen.

## 2022-11-09 ENCOUNTER — Ambulatory Visit: Payer: Medicare Other

## 2022-11-09 ENCOUNTER — Telehealth: Payer: Self-pay

## 2022-11-09 DIAGNOSIS — I420 Dilated cardiomyopathy: Secondary | ICD-10-CM

## 2022-11-09 LAB — CUP PACEART REMOTE DEVICE CHECK
Battery Remaining Longevity: 63 mo
Battery Remaining Percentage: 64 %
Battery Voltage: 2.99 V
Brady Statistic AP VP Percent: 90 %
Brady Statistic AP VS Percent: 1 %
Brady Statistic AS VP Percent: 5.2 %
Brady Statistic AS VS Percent: 1 %
Brady Statistic RA Percent Paced: 88 %
Date Time Interrogation Session: 20240306020010
Implantable Lead Connection Status: 753985
Implantable Lead Connection Status: 753985
Implantable Lead Connection Status: 753985
Implantable Lead Implant Date: 20210607
Implantable Lead Implant Date: 20210607
Implantable Lead Implant Date: 20210607
Implantable Lead Location: 753858
Implantable Lead Location: 753859
Implantable Lead Location: 753860
Implantable Pulse Generator Implant Date: 20210607
Lead Channel Impedance Value: 490 Ohm
Lead Channel Impedance Value: 560 Ohm
Lead Channel Impedance Value: 850 Ohm
Lead Channel Pacing Threshold Amplitude: 0.625 V
Lead Channel Pacing Threshold Amplitude: 0.75 V
Lead Channel Pacing Threshold Amplitude: 1 V
Lead Channel Pacing Threshold Pulse Width: 0.5 ms
Lead Channel Pacing Threshold Pulse Width: 0.5 ms
Lead Channel Pacing Threshold Pulse Width: 0.5 ms
Lead Channel Sensing Intrinsic Amplitude: 10.1 mV
Lead Channel Sensing Intrinsic Amplitude: 4.6 mV
Lead Channel Setting Pacing Amplitude: 2 V
Lead Channel Setting Pacing Amplitude: 2 V
Lead Channel Setting Pacing Amplitude: 2 V
Lead Channel Setting Pacing Pulse Width: 0.5 ms
Lead Channel Setting Pacing Pulse Width: 0.5 ms
Lead Channel Setting Sensing Sensitivity: 2 mV
Pulse Gen Model: 3562
Pulse Gen Serial Number: 3818273

## 2022-11-09 LAB — MICROSCOPIC EXAMINATION: Bacteria, UA: NONE SEEN

## 2022-11-09 LAB — URINALYSIS, ROUTINE W REFLEX MICROSCOPIC
Bilirubin, UA: NEGATIVE
Glucose, UA: NEGATIVE
Ketones, UA: NEGATIVE
Leukocytes,UA: NEGATIVE
Nitrite, UA: NEGATIVE
Specific Gravity, UA: 1.015 (ref 1.005–1.030)
Urobilinogen, Ur: 0.2 mg/dL (ref 0.2–1.0)
pH, UA: 5 (ref 5.0–7.5)

## 2022-11-09 NOTE — Telephone Encounter (Signed)
Patient is aware that his stone is closer to the bladder. Patient states he has a pace marker and will like to know what location will the lithotripsy be scheduled. Patient states he was told  that his lithotripsy could not be done at [facility] hospital. Patient is aware that a message will be sent to Dr. Diona Fanti on the location and someone will reach back out. Patient voiced understanding

## 2022-11-09 NOTE — Telephone Encounter (Signed)
-----   Message from Franchot Gallo, MD sent at 11/08/2022  5:04 PM EST ----- Please let the patient know that the stone has moved closer to the bladder, we will work on setting up his lithotripsy.

## 2022-11-10 LAB — URINE CULTURE: Organism ID, Bacteria: NO GROWTH

## 2022-11-11 NOTE — Telephone Encounter (Signed)
I s/w the pt and he has been scheduled for EKG for pre op clearance to be done 11/14/22 @ 2 pm. I assured the pt that I will update the requesting office appt for EKG 11/14/22.

## 2022-11-14 ENCOUNTER — Telehealth: Payer: Self-pay

## 2022-11-14 ENCOUNTER — Ambulatory Visit: Payer: Medicare Other | Attending: Cardiovascular Disease

## 2022-11-14 VITALS — BP 129/68 | HR 64 | Wt 179.0 lb

## 2022-11-14 DIAGNOSIS — Z01818 Encounter for other preprocedural examination: Secondary | ICD-10-CM | POA: Diagnosis not present

## 2022-11-14 DIAGNOSIS — I493 Ventricular premature depolarization: Secondary | ICD-10-CM | POA: Diagnosis not present

## 2022-11-14 NOTE — Patient Instructions (Signed)
Medication Instructions:  None *If you need a refill on your cardiac medications before your next appointment, please call your pharmacy*   Lab Work: None If you have labs (blood work) drawn today and your tests are completely normal, you will receive your results only by: [place] (if you have MyChart) OR A paper copy in the mail If you have any lab test that is abnormal or we need to change your treatment, we will call you to review the results.   Testing/Procedures: EKG   Follow-Up: At [facility], you and your health needs are our priority.  As part of our continuing mission to provide you with exceptional heart care, we have created designated Provider Care Teams.  These Care Teams include your primary Cardiologist (physician) and Advanced Practice Providers (APPs -  Physician Assistants and Nurse Practitioners) who all work together to provide you with the care you need, when you need it.  We recommend signing up for the patient portal called "MyChart".  Sign up information is provided on this After Visit Summary.  MyChart is used to connect with patients for Virtual Visits (Telemedicine).  Patients are able to view lab/test results, encounter notes, upcoming appointments, etc.  Non-urgent messages can be sent to your provider as well.   To learn more about what you can do with MyChart, go to [email or web address].    Your next appointment:   Follow up as needed  Provider:   Dr. Ellyn Hack  Other Instructions None

## 2022-11-14 NOTE — Telephone Encounter (Signed)
Patient is aware and voiced understanding.

## 2022-11-14 NOTE — Progress Notes (Unsigned)
   Nurse Visit   Date of Encounter: 11/14/2022 ID: Machael, Raine August 13, 1941, MRN [id number]  PCP:  [name], MD   [place] Providers Cardiologist:  [name], MD Electrophysiologist:  [name], MD { Click to update primary MD,subspecialty MD or APP then REFRESH:1}     Visit Details   VS:  BP 129/68 (BP Location: Right Arm, Patient Position: Sitting, Cuff Size: Normal)   Pulse 64   Wt 179 lb (81.2 kg)   BMI 27.22 kg/m  , BMI Body mass index is 27.22 kg/m.  Wt Readings from Last 3 Encounters:  11/14/22 179 lb (81.2 kg)  10/22/22 185 lb (83.9 kg)  06/27/22 190 lb (86.2 kg)     Reason for visit: Pre-Op EKG Performed today: {Blank multiple:19196::"Vitals","EKG","Provider consulted:***","Education"} Changes (medications, testing, etc.) : None Length of Visit: 20 minutes    Medications Adjustments/Labs and Tests Ordered: 12 Lead EKG  Patient seen in office today for Pre-Op EKG.  Dr. Acie Fredrickson reviewed EKG for clearance.  Signed, [place], LPN  0/35/4656 8:12 PM

## 2022-11-14 NOTE — Telephone Encounter (Signed)
-----   Message from Franchot Gallo, MD sent at 11/11/2022 12:51 PM EST ----- Please call pt--urine culture was neg so no abx needed. Let him know we can't do procedure soon-- .Per new hospital /Surgery Center protocol issued on 10/31/2022, patient's w/ upper respiratory illness cannot be rescheduled for surgery for 4 weeks or at least 2 weeks after resolution of symptoms. ----- Message ----- From: Sherrilyn Rist, CMA Sent: 11/10/2022   9:34 AM EST To: Franchot Gallo, MD  Please review

## 2022-11-18 NOTE — Telephone Encounter (Signed)
Per pre op APP she faxed clearance from Mayra Reel, NP 10/2022. Per Diona Browner, NP EKG, surgeon office was requesting an EKG as one had not been done in 6 months. I will fax EKG to requesting office today to [facility] surgery scheduler.

## 2022-11-18 NOTE — Telephone Encounter (Signed)
I s/w [name] and assured her the pt did have his EKG done on 11/14/22. I will ask our pre op APP to please review if pt has now been cleared for pre op. Assured [name] once I have clearance completed we will fax notes  over.

## 2022-11-18 NOTE — Telephone Encounter (Signed)
Selita with [facility] is calling again to get update for pre-op for medical and device.

## 2022-11-18 NOTE — Telephone Encounter (Signed)
I have faxed all clearance notes and EKG to requesting office today.

## 2022-11-23 ENCOUNTER — Other Ambulatory Visit: Payer: Self-pay | Admitting: Urology

## 2022-11-23 ENCOUNTER — Telehealth: Payer: Self-pay

## 2022-11-23 ENCOUNTER — Encounter: Payer: Self-pay | Admitting: Internal Medicine

## 2022-11-23 ENCOUNTER — Ambulatory Visit: Payer: Medicare Other | Attending: Internal Medicine | Admitting: Internal Medicine

## 2022-11-23 VITALS — BP 156/78 | HR 76 | Ht 68.0 in | Wt 179.0 lb

## 2022-11-23 DIAGNOSIS — I493 Ventricular premature depolarization: Secondary | ICD-10-CM

## 2022-11-23 DIAGNOSIS — Z950 Presence of cardiac pacemaker: Secondary | ICD-10-CM | POA: Insufficient documentation

## 2022-11-23 DIAGNOSIS — I10 Essential (primary) hypertension: Secondary | ICD-10-CM | POA: Diagnosis not present

## 2022-11-23 DIAGNOSIS — I255 Ischemic cardiomyopathy: Secondary | ICD-10-CM | POA: Insufficient documentation

## 2022-11-23 DIAGNOSIS — I25810 Atherosclerosis of coronary artery bypass graft(s) without angina pectoris: Secondary | ICD-10-CM | POA: Diagnosis not present

## 2022-11-23 DIAGNOSIS — Z79899 Other long term (current) drug therapy: Secondary | ICD-10-CM | POA: Insufficient documentation

## 2022-11-23 MED ORDER — SPIRONOLACTONE 25 MG PO TABS: 12.5000 mg | ORAL_TABLET | Freq: Every day | ORAL | 3 refills | Status: DC

## 2022-11-23 NOTE — Telephone Encounter (Signed)
This patient needs apt for 11/25/22 with Dr. [name] for medical clearance. Patient is also overdue. Sheri given ok to add patient onto schedule.   Please put in apt note to complete medical clearance.

## 2022-11-23 NOTE — Patient Instructions (Signed)
Medication Instructions:  Your physician has recommended you make the following change in your medication:   ** Start Spironolactone 25mg  - 1/2 tablet by mouth daily  *If you need a refill on your cardiac medications before your next appointment, please call your pharmacy*   Lab Work: BMET in 2 weeks If you have labs (blood work) drawn today and your tests are completely normal, you will receive your results only by: [place] (if you have MyChart) OR A paper copy in the mail If you have any lab test that is abnormal or we need to change your treatment, we will call you to review the results.   Testing/Procedures: None ordered.    Follow-Up: At [facility], you and your health needs are our priority.  As part of our continuing mission to provide you with exceptional heart care, we have created designated Provider Care Teams.  These Care Teams include your primary Cardiologist (physician) and Advanced Practice Providers (APPs -  Physician Assistants and Nurse Practitioners) who all work together to provide you with the care you need, when you need it.  We recommend signing up for the patient portal called "MyChart".  Sign up information is provided on this After Visit Summary.  MyChart is used to connect with patients for Virtual Visits (Telemedicine).  Patients are able to view lab/test results, encounter notes, upcoming appointments, etc.  Non-urgent messages can be sent to your provider as well.   To learn more about what you can do with MyChart, go to [email or web address].    Your next appointment:   As scheduled

## 2022-11-23 NOTE — Telephone Encounter (Signed)
Fax sent to [facility] with device clearance with "Ok" that it went thru.

## 2022-11-23 NOTE — Progress Notes (Signed)
Patient Care Team: [name], MD as PCP - General (Internal Medicine) Leonie Man, MD as PCP - Cardiology (Cardiology) Constance Haw, MD as PCP - Electrophysiology (Cardiology)   HPI  [name] is a 82 y.o. male Is seen today as he is scheduled for lithotripsy later this week.  He is status post [facility]-P 6/21.  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   Frequent PVCs & fatigue -> MYOVIEW w/ inferior /r MV distribution --> CATH: only RIMA-LAD patent (LIMA atretic & SVGs occluded) --> Staged PCI of Native RCA & Cx; followed by PCI-distal RCA   CAD S/P percutaneous coronary angioplasty 8/'12; 06/'16   a) PCI- Cx-OM W/ 2 overlapping Resolute DES 3x15 & 3x18 stents (3.76mm),  PCI-RCA -  Promus DES 3x38 (3.75mm);; b) 6/'16: [place] DES 2.75X28 (3.0 mm) -- stents patent in 01/2018;  01/2018 -stents remain patent   CAS (cerebral atherosclerosis)    CAROTID DOPPLER, 08/25/2011 - mildly abnormal   Chronic back pain    Dilated cardiomyopathy ([place]): EF ~30-35%, 01/05/2018   11/2019 - EF ~30%, global HK with Septal dyssynergy --> 6/6/'21 - s/p CRT-P (St. 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 unclear of etiology    Past Surgical History:  Procedure Laterality Date   BIV PACEMAKER INSERTION CRT-P N/A 02/10/2020   Procedure: BIV PACEMAKER INSERTION CRT-P;  Surgeon: Constance Haw, MD;  Location: [facility];  Service: Cardiovascular;  Laterality: N/A;   CARDIAC CATHETERIZATION  07/15/2004   CABG recommended; continue medical therapy   CARDIAC CATHETERIZATION  04/26/2011   Patent RIMA-LAD, atretic LIMA-Dx (previous) 100 and CTO SVG to Cx and SVG to PDA. LAD 80-90% eccentric stenosis at SP1 that has 70% stenosis. Cx: Large OM and another distal branch with 90-95% stenosis followed by lesion in the OM branch. RCA dominant 60-80% lesion proximally w/ several old lesions. PDA occluded 70-80% annular lesion in the proximal RCA. 3 of 4 grafts CTO RIMA-LAD patent   CARDIAC CATHETERIZATION N/A 02/20/2015   Procedure: Left Heart Cath and Coronary Angiography;  Surgeon: Leonie Man, MD;  Location: [facility]  [facility]: Widely patent p-mRCA DES with dRCA 50%-PAVG 80% --> PCI. Patent overlapping stents --mCx-OM. 99% pLAD - patent RIMA-dLAD.  Known CTO SVG-rPL, SVG-OM3, :LIMA-D1.   CATARACT EXTRACTION W/ INTRAOCULAR LENS  IMPLANT, BILATERAL Bilateral ~ 2006/2007   CORONARY ANGIOPLASTY WITH STENT PLACEMENT  04/28/2011; 02/20/2015   PCI-Cx-OM 3 overlapping Resolute DES 3 mm x37mm and 3x42mm --> 3.50mm; PCI to RCA - Promus Element DES 3x38mm --> 3.60mm;; b) dRCA-rPAV: Promus Premier DES 2.42mm X 28 mm (3.0 mm)    CORONARY ARTERY BYPASS GRAFT  07/23/2004   LIMA-Cx RIMA-LAD, SVG-diagonal, SVG-PDA   RIGHT/LEFT HEART CATH AND CORONARY/GRAFT ANGIOGRAPHY N/A 01/09/2018   Procedure: RIGHT/LEFT HEART CATH AND  CORONARY/GRAFT ANGIOGRAPHY;  Surgeon: Leonie Man, MD;; Known occluded LIMA-Diag & SVG-RCA, SVG-OM.  Mod-severe pLAD that is grafted after D1 - patent RIMA- wraparound LAD, distal 1/2 of PDA territory. Patent Cx-OM DES (ost Cx mild) & p-mRCA overlapping DES & dRCA-RPAV DES.  PCWP 12 mmHg, LVP-EDP: 140/6 - 14 mmH. RAP 2 mmHg. RVP-EDP 33/0 - 5  mmHg   TRANSTHORACIC ECHOCARDIOGRAM  02/18/2015    Mild concentric LVH, EF 55-60%, no RWMA, Gr 1 DD, aortic sclerosis without stenosis   TRANSTHORACIC ECHOCARDIOGRAM  12/2017; 06/2019   (New LBBB)  EF~35% with diffuse HK of septal & lateral walls.  Mildly dilated LV.  "GR 1 "DD with high filling pressures. ? w/ normal LA size; 10/20: EF 30-35%, LBBB related Septal dyskinesis, GR2 DD (but normal LA size).    TRANSTHORACIC ECHOCARDIOGRAM  11/27/2019   (On optimal/max tolerated dose of carvedilol and Entresto): EF estimated 30% with moderate to severe decreased function and global hypokinesis.  Significant septal-lateral dyssynchrony from LBBB.  Mild LV dilation.  At least GR 1 DD.  Mildly reduced RV function but normal pressures.  Mild aortic sclerosis but no significant regurgitation.   TRANSTHORACIC ECHOCARDIOGRAM  09/14/2020   (s/p CRT-P) EF estimated 30%. Moderately decreased function with global HK. GR 1 DD. Mildly reduced RV function. Mild aortic valve thickening/sclerosis but no stenosis. (Compared to prior study, no notable change in fxn, but decreased LV r dilation with slight decrease in RV function).   TRANSURETHRAL RESECTION OF PROSTATE  ~ 2002/2003    Current Meds  Medication Sig   ALPRAZolam (XANAX) 0.5 MG tablet Take 0.5 mg by mouth 2 (two) times daily as needed for anxiety (tremors).   aspirin EC 81 MG tablet Take 81 mg by mouth daily.    bisoprolol (ZEBETA) 5 MG tablet TAKE 1 TABLET BY MOUTH IN THE MORNING AND 1 AT BEDTIME OR  AS  DIRECTED (Patient taking differently: Take 5 mg by mouth in the morning and at bedtime.)   chlorzoxazone  (PARAFON) 500 MG tablet Take 500 mg by mouth daily as needed for muscle spasms.   ezetimibe-simvastatin (VYTORIN) 10-10 MG tablet Take 1 tablet by mouth at bedtime.   furosemide (LASIX) 20 MG tablet Take 1 tablet (20 mg total) by mouth as needed for edema.   HYDROcodone-acetaminophen (NORCO/VICODIN) 5-325 MG tablet Take 1 tablet by mouth every 6 (six) hours as needed for severe pain.   ondansetron (ZOFRAN) 4 MG tablet Take 1 tablet (4 mg total) by mouth every 8 (eight) hours as needed for nausea or vomiting.   oxyCODONE-acetaminophen (PERCOCET) 5-325 MG tablet Take 1 tablet by mouth every 4 (four) hours as needed.   Polyethyl Glycol-Propyl Glycol (SYSTANE OP) Place 1 drop into both eyes 2 (two) times daily.   SYMBICORT 160-4.5 MCG/ACT inhaler Inhale 2 puffs  into the lungs in the morning and at bedtime.   tamsulosin (FLOMAX) 0.4 MG CAPS capsule Take 1 capsule (0.4 mg total) by mouth daily after supper.    Allergies  Allergen Reactions   Meperidine Hcl     Cold sweats, dizziness    Penicillins     Severe headaches Has patient had a PCN reaction causing immediate rash, facial/tongue/throat swelling, SOB or lightheadedness with hypotension: No Has patient had a PCN reaction causing severe rash involving mucus membranes or skin necrosis: No Has patient had a PCN reaction that required hospitalization: No Has patient had a PCN reaction occurring within the last 10 years: No If all of the above answers are "NO", then may proceed with Cephalosporin use.       Review of Systems negative except from HPI and PMH  Physical Exam BP (!) 156/78   Pulse 76   Ht 5\' 8"  (1.727 m)   Wt 179 lb (81.2 kg)   SpO2 96%   BMI 27.22 kg/m  Well developed and well nourished in no acute distress HENT normal E scleral and icterus clear Neck Supple JVP flat; carotids brisk and full Irregular rate and rhythm, no murmurs gallops or rub Soft with active bowel sounds No clubbing cyanosis  Edema Alert and  oriented, grossly normal motor and sensory function Skin Warm and Dry  ECG sinus w P-synchronous/ AV  pacing Freq PVCs  CrCl cannot be calculated (Patient's most recent lab result is older than the maximum 21 days allowed.).   Assessment and  Plan Coronary artery disease with prior bypass  Hypertension  Congestive heart failure  Pacemaker-Saint Jude CRT-P  FROM ESC guidelines 2022  lithotripsy, as it is currently performed, does not lead to EMI with adverse consequences in CIED patients. Cardiac implantable electronic device reprogramming or magnet application is not deemed necessary during SWL. It is, however, reasonable to avoid placing the lithotripsy beam near the generator and to applyECG monitoring during the procedure  Elevated BP>> will add aldactone given cardiomyopathy,  could also be on ARB/ACE +/- Jardiance  His PVCs may be causal to his cardiomyopathy and would suggest consideration of suppression   Will defer to primary team    Current medicines are reviewed at length with the patient today .  The patient does not   have concerns regarding medicines.

## 2022-11-23 NOTE — Telephone Encounter (Signed)
[facility] Clearance to be sent to...  [facility] [phone] ext: [place] Fax: [phone].

## 2022-11-24 NOTE — Progress Notes (Signed)
Talked with Patient. Instructions given. Arrival time 0600 NPO after MN. Wife is the driver. Stopped ASA first of March. To bring in folder. Hx and Meds reviewed.

## 2022-11-28 ENCOUNTER — Ambulatory Visit (HOSPITAL_BASED_OUTPATIENT_CLINIC_OR_DEPARTMENT_OTHER): Payer: Medicare Other | Admitting: Certified Registered"

## 2022-11-28 ENCOUNTER — Ambulatory Visit (HOSPITAL_BASED_OUTPATIENT_CLINIC_OR_DEPARTMENT_OTHER)
Admission: RE | Admit: 2022-11-28 | Discharge: 2022-11-28 | Disposition: A | Discharge status: Home / Self Care | Payer: Medicare Other | Attending: Urology | Admitting: Urology

## 2022-11-28 ENCOUNTER — Ambulatory Visit (HOSPITAL_COMMUNITY): Payer: Medicare Other

## 2022-11-28 ENCOUNTER — Other Ambulatory Visit: Payer: Self-pay

## 2022-11-28 ENCOUNTER — Encounter (HOSPITAL_BASED_OUTPATIENT_CLINIC_OR_DEPARTMENT_OTHER)
Admission: RE | Disposition: A | Discharge status: Home / Self Care | Payer: Self-pay | Source: Home / Self Care | Attending: Urology

## 2022-11-28 ENCOUNTER — Encounter (HOSPITAL_BASED_OUTPATIENT_CLINIC_OR_DEPARTMENT_OTHER): Payer: Self-pay | Admitting: Urology

## 2022-11-28 DIAGNOSIS — Z87891 Personal history of nicotine dependence: Secondary | ICD-10-CM | POA: Diagnosis not present

## 2022-11-28 DIAGNOSIS — I25810 Atherosclerosis of coronary artery bypass graft(s) without angina pectoris: Secondary | ICD-10-CM | POA: Diagnosis not present

## 2022-11-28 DIAGNOSIS — I10 Essential (primary) hypertension: Secondary | ICD-10-CM | POA: Diagnosis not present

## 2022-11-28 DIAGNOSIS — G8929 Other chronic pain: Secondary | ICD-10-CM | POA: Diagnosis not present

## 2022-11-28 DIAGNOSIS — N200 Calculus of kidney: Secondary | ICD-10-CM | POA: Diagnosis not present

## 2022-11-28 DIAGNOSIS — M549 Dorsalgia, unspecified: Secondary | ICD-10-CM | POA: Insufficient documentation

## 2022-11-28 DIAGNOSIS — N201 Calculus of ureter: Secondary | ICD-10-CM

## 2022-11-28 DIAGNOSIS — E785 Hyperlipidemia, unspecified: Secondary | ICD-10-CM | POA: Insufficient documentation

## 2022-11-28 DIAGNOSIS — I2510 Atherosclerotic heart disease of native coronary artery without angina pectoris: Secondary | ICD-10-CM | POA: Diagnosis not present

## 2022-11-28 DIAGNOSIS — I420 Dilated cardiomyopathy: Secondary | ICD-10-CM | POA: Diagnosis not present

## 2022-11-28 DIAGNOSIS — I252 Old myocardial infarction: Secondary | ICD-10-CM | POA: Diagnosis not present

## 2022-11-28 DIAGNOSIS — Z951 Presence of aortocoronary bypass graft: Secondary | ICD-10-CM | POA: Diagnosis not present

## 2022-11-28 DIAGNOSIS — K219 Gastro-esophageal reflux disease without esophagitis: Secondary | ICD-10-CM | POA: Diagnosis not present

## 2022-11-28 DIAGNOSIS — Z955 Presence of coronary angioplasty implant and graft: Secondary | ICD-10-CM | POA: Diagnosis not present

## 2022-11-28 DIAGNOSIS — Z950 Presence of cardiac pacemaker: Secondary | ICD-10-CM | POA: Diagnosis not present

## 2022-11-28 DIAGNOSIS — J45909 Unspecified asthma, uncomplicated: Secondary | ICD-10-CM | POA: Insufficient documentation

## 2022-11-28 HISTORY — PX: EXTRACORPOREAL SHOCK WAVE LITHOTRIPSY: SHX1557

## 2022-11-28 SURGERY — LITHOTRIPSY, ESWL
Anesthesia: Monitor Anesthesia Care | Laterality: Right

## 2022-11-28 MED ORDER — SODIUM CHLORIDE 0.9 % IV SOLN: INTRAVENOUS | Status: DC

## 2022-11-28 MED ORDER — PROPOFOL 500 MG/50ML IV EMUL
INTRAVENOUS | Status: DC | PRN
  Administered 2022-11-28: 25 ug/kg/min via INTRAVENOUS

## 2022-11-28 MED ORDER — DIAZEPAM 5 MG PO TABS: 10.0000 mg | ORAL_TABLET | ORAL | Status: DC

## 2022-11-28 MED ORDER — MIDAZOLAM HCL 2 MG/2ML IJ SOLN
INTRAMUSCULAR | Status: AC
  Filled 2022-11-28: qty 2

## 2022-11-28 MED ORDER — ONDANSETRON HCL 4 MG/2ML IJ SOLN: 4.0000 mg | Freq: Once | INTRAMUSCULAR | Status: DC | PRN

## 2022-11-28 MED ORDER — CIPROFLOXACIN HCL 500 MG PO TABS
ORAL_TABLET | ORAL | Status: AC
  Filled 2022-11-28: qty 1

## 2022-11-28 MED ORDER — PROPOFOL 1000 MG/100ML IV EMUL
INTRAVENOUS | Status: AC
  Filled 2022-11-28: qty 100

## 2022-11-28 MED ORDER — FENTANYL CITRATE (PF) 100 MCG/2ML IJ SOLN
INTRAMUSCULAR | Status: AC
  Filled 2022-11-28: qty 2

## 2022-11-28 MED ORDER — CIPROFLOXACIN HCL 500 MG PO TABS
500.0000 mg | ORAL_TABLET | ORAL | Status: AC
  Administered 2022-11-28: 500 mg via ORAL

## 2022-11-28 MED ORDER — OXYCODONE HCL 5 MG PO TABS
5.0000 mg | ORAL_TABLET | Freq: Once | ORAL | Status: AC | PRN
  Administered 2022-11-28: 5 mg via ORAL

## 2022-11-28 MED ORDER — OXYCODONE HCL 5 MG/5ML PO SOLN: 5.0000 mg | Freq: Once | ORAL | Status: AC | PRN

## 2022-11-28 MED ORDER — FENTANYL CITRATE (PF) 100 MCG/2ML IJ SOLN
INTRAMUSCULAR | Status: DC | PRN
  Administered 2022-11-28: 50 ug via INTRAVENOUS
  Administered 2022-11-28 (×2): 25 ug via INTRAVENOUS

## 2022-11-28 MED ORDER — ACETAMINOPHEN 160 MG/5ML PO SOLN: 325.0000 mg | ORAL | Status: DC | PRN

## 2022-11-28 MED ORDER — ACETAMINOPHEN 325 MG PO TABS: 325.0000 mg | ORAL_TABLET | ORAL | Status: DC | PRN

## 2022-11-28 MED ORDER — LACTATED RINGERS IV SOLN: INTRAVENOUS | Status: DC | PRN

## 2022-11-28 MED ORDER — DIPHENHYDRAMINE HCL 25 MG PO CAPS
25.0000 mg | ORAL_CAPSULE | ORAL | Status: AC
  Administered 2022-11-28: 25 mg via ORAL

## 2022-11-28 MED ORDER — OXYCODONE HCL 5 MG PO TABS
ORAL_TABLET | ORAL | Status: AC
  Filled 2022-11-28: qty 1

## 2022-11-28 MED ORDER — MIDAZOLAM HCL 5 MG/5ML IJ SOLN
INTRAMUSCULAR | Status: DC | PRN
  Administered 2022-11-28 (×2): 1 mg via INTRAVENOUS

## 2022-11-28 MED ORDER — DEXMEDETOMIDINE HCL IN NACL 80 MCG/20ML IV SOLN
INTRAVENOUS | Status: AC
  Filled 2022-11-28: qty 20

## 2022-11-28 MED ORDER — DIPHENHYDRAMINE HCL 25 MG PO CAPS
ORAL_CAPSULE | ORAL | Status: AC
  Filled 2022-11-28: qty 1

## 2022-11-28 MED ORDER — PHENYLEPHRINE 80 MCG/ML (10ML) SYRINGE FOR IV PUSH (FOR BLOOD PRESSURE SUPPORT)
PREFILLED_SYRINGE | INTRAVENOUS | Status: AC
  Filled 2022-11-28: qty 10

## 2022-11-28 MED ORDER — PHENYLEPHRINE HCL (PRESSORS) 10 MG/ML IV SOLN
INTRAVENOUS | Status: DC | PRN
  Administered 2022-11-28: 60 ug via INTRAVENOUS

## 2022-11-28 NOTE — Anesthesia Postprocedure Evaluation (Signed)
Anesthesia Post Note  Patient: [name]  Procedure(s) Performed: EXTRACORPOREAL SHOCK WAVE LITHOTRIPSY (ESWL) GAITED (Right)     Patient location during evaluation: Phase II Anesthesia Type: MAC Level of consciousness: awake Pain management: pain level controlled Vital Signs Assessment: post-procedure vital signs reviewed and stable Respiratory status: spontaneous breathing Cardiovascular status: stable Postop Assessment: no apparent nausea or vomiting Anesthetic complications: no  No notable events documented.  Last Vitals:  Vitals:   11/28/22 0635 11/28/22 0903  BP: (!) 165/84 (!) 151/82  Pulse: (!) 55 60  Resp: 16 20  Temp: (!) 36.4 C   SpO2: 96% 96%    Last Pain:  Vitals:   11/28/22 0635  TempSrc: Oral  PainSc: 0-No pain                 Huston Foley

## 2022-11-28 NOTE — Transfer of Care (Signed)
Immediate Anesthesia Transfer of Care Note  Patient: [name]  Procedure(s) Performed: EXTRACORPOREAL SHOCK WAVE LITHOTRIPSY (ESWL) GAITED (Right)  Patient Location: PACU  Anesthesia Type:MAC  Level of Consciousness: awake, alert , oriented, and patient cooperative  Airway & Oxygen Therapy: Patient Spontanous Breathing  Post-op Assessment: Report given to RN and Post -op Vital signs reviewed and stable  Post vital signs: Reviewed and stable  Last Vitals:  Vitals Value Taken Time  BP    Temp    Pulse    Resp    SpO2      Last Pain:  Vitals:   11/28/22 0635  TempSrc: Oral  PainSc: 0-No pain         Complications: No notable events documented.

## 2022-11-28 NOTE — Anesthesia Preprocedure Evaluation (Addendum)
Anesthesia Evaluation  Patient identified by MRN, date of birth, ID band Patient awake    Reviewed: Allergy & Precautions, NPO status , Patient's Chart, lab work & pertinent test results, reviewed documented beta blocker date and time   Airway Mallampati: I       Dental  (+) Upper Dentures, Lower Dentures   Pulmonary sleep apnea , former smoker   Pulmonary exam normal        Cardiovascular hypertension, Pt. on home beta blockers and Pt. on medications + CAD, + Past MI, + Cardiac Stents and + CABG  Normal cardiovascular exam+ dysrhythmias Supra Ventricular Tachycardia + pacemaker      Neuro/Psych    GI/Hepatic   Endo/Other    Renal/GU      Musculoskeletal   Abdominal Normal abdominal exam  (+)   Peds  Hematology   Anesthesia Other Findings     Normal remote reviewed. Battery and lead parameters stable.     MyChart Results Release  MyChart Status: Active  Results Release  Conclusion  Scheduled remote reviewed. Normal device function.   1 HVR, duration 54 sec, HR 176, EGM shows regular 1:1, atrial driven 1 AMS, [id number] in duration Next remote 91 days. LA ESSIONS     1. Left ventricular ejection fraction, by estimation, is 30%. The left  ventricle has moderately decreased function. The left ventricle  demonstrates global hypokinesis. Left ventricular diastolic parameters are  consistent with Grade I diastolic  dysfunction (impaired relaxation).   2. Right ventricular systolic function is moderately reduced. The right  ventricular size is normal.   3. The mitral valve is grossly normal. No evidence of mitral valve  regurgitation.   4. The aortic valve is tricuspid. There is mild calcification of the  aortic valve. There is mild thickening of the aortic valve. Aortic valve  regurgitation is not visualized. No aortic stenosis is present.   Comparison(s): A prior study was performed on 11/27/19. Prior  images  reviewed side by side. Stable LV function, decrease in left ventricular  dilation, slight decrease in right ventricular function.     Reproductive/Obstetrics                             Anesthesia Physical Anesthesia Plan  ASA: 3  Anesthesia Plan: MAC   Post-op Pain Management:    Induction:   PONV Risk Score and Plan: 2 and Treatment may vary due to age or medical condition  Airway Management Planned:   Additional Equipment: None  Intra-op Plan:   Post-operative Plan:   Informed Consent: I have reviewed the patients History and Physical, chart, labs and discussed the procedure including the risks, benefits and alternatives for the proposed anesthesia with the patient or authorized representative who has indicated his/her understanding and acceptance.     Dental advisory given  Plan Discussed with: CRNA  Anesthesia Plan Comments:        Anesthesia Quick Evaluation

## 2022-11-28 NOTE — Op Note (Signed)
See [facility] operative note scanned into chart. Also because of the size, density, location and other factors that cannot be anticipated I feel this will likely be a staged procedure. This fact supersedes any indication in the scanned [facility] note to the contrary.  

## 2022-11-28 NOTE — OR Nursing (Signed)
When removing paper tape patient has small skin tear he states that happens with his age. Patients wife and son aware and 2x2 applied and coban tape. Darin Engels rn

## 2022-11-28 NOTE — Discharge Instructions (Addendum)
1. You should strain your urine and collect all fragments and bring them to your follow up appointment.  2. You should take your pain medication as needed.  Please call if your pain is severe to the point that it is not controlled with your pain medication. 3. You should call if you develop fever > 101 or persistent nausea or vomiting. 4. Your doctor may prescribe tamsulosin to take to help facilitate stone passage.  Post Anesthesia Home Care Instructions  Activity: Get plenty of rest for the remainder of the day. A responsible adult should stay with you for 24 hours following the procedure.  For the next 24 hours, DO NOT: -Drive a car -Paediatric nurse -Drink alcoholic beverages -Take any medication unless instructed by your physician -Make any legal decisions or sign important papers.  Meals: Start with liquid foods such as gelatin or soup. Progress to regular foods as tolerated. Avoid greasy, spicy, heavy foods. If nausea and/or vomiting occur, drink only clear liquids until the nausea and/or vomiting subsides. Call your physician if vomiting continues.  Special Instructions/Symptoms: Your throat may feel dry or sore from the anesthesia or the breathing tube placed in your throat during surgery. If this causes discomfort, gargle with warm salt water. The discomfort should disappear within 24 hours.

## 2022-11-28 NOTE — Interval H&P Note (Signed)
History and Physical Interval Note:  11/28/2022 6:57 AM  [name]  has presented today for surgery, with the diagnosis of URETERAL CALCULUS.  The various methods of treatment have been discussed with the patient and family. After consideration of risks, benefits and other options for treatment, the patient has consented to  Procedure(s) with comments: EXTRACORPOREAL SHOCK WAVE LITHOTRIPSY (ESWL) GAITED (Right) - 107 MINS as a surgical intervention.  The patient's history has been reviewed, patient examined, no change in status, stable for surgery.  I have reviewed the patient's chart and labs.  Questions were answered to the patient's satisfaction.     [employer]

## 2022-11-29 ENCOUNTER — Encounter (HOSPITAL_BASED_OUTPATIENT_CLINIC_OR_DEPARTMENT_OTHER): Payer: Self-pay | Admitting: Urology

## 2022-11-30 NOTE — Telephone Encounter (Signed)
Pt informed of MD advisement and pt is agreeable to plan. Pt would prefer to have someone assist with placing monitor. Pt will come by the office next Friday for monitor to be put on by monitor team. He is aware to come before 3pm that day. Pt agreeable to plan.

## 2022-11-30 NOTE — Telephone Encounter (Signed)
-----   Message from [name], MD sent at 11/24/2022  7:31 AM EDT ----- Venida Jarvis can you get a 2 week monitor for PVCs? ----- Message ----- From: Deboraha Sprang, MD Sent: 11/23/2022   4:21 PM EDT To: Leonie Man, MD; [name], MD  Will, this guy is having frequent PVC and ownder whether it is contributing to his residual cardiomyopathy  Waunita Schooner last EF was 1/22 30%  BP today 150 ( and at home)  have started spiro-- may benefit from Guideline directed medical therapy

## 2022-12-05 ENCOUNTER — Telehealth: Payer: Self-pay | Admitting: Cardiology

## 2022-12-05 DIAGNOSIS — N202 Calculus of kidney with calculus of ureter: Secondary | ICD-10-CM | POA: Diagnosis not present

## 2022-12-05 DIAGNOSIS — Z6827 Body mass index (BMI) 27.0-27.9, adult: Secondary | ICD-10-CM | POA: Diagnosis not present

## 2022-12-05 DIAGNOSIS — Z79899 Other long term (current) drug therapy: Secondary | ICD-10-CM | POA: Diagnosis not present

## 2022-12-05 DIAGNOSIS — I420 Dilated cardiomyopathy: Secondary | ICD-10-CM | POA: Diagnosis not present

## 2022-12-05 DIAGNOSIS — I110 Hypertensive heart disease with heart failure: Secondary | ICD-10-CM | POA: Diagnosis not present

## 2022-12-05 DIAGNOSIS — I5042 Chronic combined systolic (congestive) and diastolic (congestive) heart failure: Secondary | ICD-10-CM | POA: Diagnosis not present

## 2022-12-05 DIAGNOSIS — J4540 Moderate persistent asthma, uncomplicated: Secondary | ICD-10-CM | POA: Diagnosis not present

## 2022-12-05 DIAGNOSIS — Z713 Dietary counseling and surveillance: Secondary | ICD-10-CM | POA: Diagnosis not present

## 2022-12-05 DIAGNOSIS — Z950 Presence of cardiac pacemaker: Secondary | ICD-10-CM | POA: Diagnosis not present

## 2022-12-05 DIAGNOSIS — I10 Essential (primary) hypertension: Secondary | ICD-10-CM | POA: Diagnosis not present

## 2022-12-05 DIAGNOSIS — N200 Calculus of kidney: Secondary | ICD-10-CM | POA: Diagnosis not present

## 2022-12-05 NOTE — Telephone Encounter (Signed)
Pt called requesting to have RN [name] to give him a callback. Please advise

## 2022-12-05 NOTE — Telephone Encounter (Signed)
Spoke to  patient  he would like an appointment  with Dr Ellyn Hack. He states he is having  very frequent PVC's .  Prior to having  urology procedure.  Since the procedure he is still having them ,patient states he has been seeing Dr Caryl Comes about  the issues. Patient states he thinks Dr Caryl Comes and Dr Ellyn Hack have spoken about this issue. Patient states he has not seen Dr Ellyn Hack in a while . The scheduler informed him that Dr Ellyn Hack did not have any opening at present time.    RN informed patient  will need to  contact him backed once reviewing  schedule for an  appointment patient verbalized understanding.

## 2022-12-05 NOTE — Telephone Encounter (Signed)
RN  called [name]  - after reviewing  schedule . RN offered an appointment for 4/17/124 left message.   RN  reviewed  [name]'s chart- Dr Caryl Comes has schedule for [name] to wear a 14 day Zio  for PVC's [name] will not start wearing monitor until 12/09/22.  Rn called [name] back to inform him that Dr Caryl Comes is handling this issue at present time.  It would best for [name] to wear monitor  get results from Dr Caryl Comes and plan.  Will reschedule his appointment with Dr Ellyn Hack for Jan 24, 2023  at 1:30 pm - since his last appt was  12/06/21.  [name] is in agreement. He will review appointment on MyChart.

## 2022-12-09 ENCOUNTER — Ambulatory Visit (INDEPENDENT_AMBULATORY_CARE_PROVIDER_SITE_OTHER): Payer: Medicare Other

## 2022-12-09 ENCOUNTER — Ambulatory Visit: Payer: Medicare Other | Attending: Internal Medicine

## 2022-12-09 ENCOUNTER — Other Ambulatory Visit: Payer: Self-pay

## 2022-12-09 DIAGNOSIS — I25810 Atherosclerosis of coronary artery bypass graft(s) without angina pectoris: Secondary | ICD-10-CM

## 2022-12-09 DIAGNOSIS — Z79899 Other long term (current) drug therapy: Secondary | ICD-10-CM | POA: Diagnosis not present

## 2022-12-09 DIAGNOSIS — Z950 Presence of cardiac pacemaker: Secondary | ICD-10-CM

## 2022-12-09 DIAGNOSIS — I493 Ventricular premature depolarization: Secondary | ICD-10-CM

## 2022-12-09 DIAGNOSIS — I10 Essential (primary) hypertension: Secondary | ICD-10-CM | POA: Diagnosis not present

## 2022-12-09 DIAGNOSIS — I255 Ischemic cardiomyopathy: Secondary | ICD-10-CM | POA: Diagnosis not present

## 2022-12-09 DIAGNOSIS — N200 Calculus of kidney: Secondary | ICD-10-CM

## 2022-12-09 NOTE — Progress Notes (Unsigned)
Applied a 14 day Zio XT monitor to patient in the office ?

## 2022-12-10 LAB — BASIC METABOLIC PANEL
BUN/Creatinine Ratio: 18 (ref 10–24)
BUN: 21 mg/dL (ref 8–27)
CO2: 25 mmol/L (ref 20–29)
Calcium: 9.3 mg/dL (ref 8.6–10.2)
Chloride: 102 mmol/L (ref 96–106)
Creatinine, Ser: 1.17 mg/dL (ref 0.76–1.27)
Glucose: 109 mg/dL — ABNORMAL HIGH (ref 70–99)
Potassium: 4.6 mmol/L (ref 3.5–5.2)
Sodium: 143 mmol/L (ref 134–144)
eGFR: 62 mL/min/{1.73_m2} (ref >59)

## 2022-12-14 ENCOUNTER — Encounter: Payer: Self-pay | Admitting: Urology

## 2022-12-14 ENCOUNTER — Ambulatory Visit (INDEPENDENT_AMBULATORY_CARE_PROVIDER_SITE_OTHER): Payer: Medicare Other | Admitting: Urology

## 2022-12-14 VITALS — BP 150/63 | HR 61

## 2022-12-14 DIAGNOSIS — N202 Calculus of kidney with calculus of ureter: Secondary | ICD-10-CM

## 2022-12-14 DIAGNOSIS — N200 Calculus of kidney: Secondary | ICD-10-CM | POA: Diagnosis not present

## 2022-12-14 NOTE — Progress Notes (Signed)
Remote pacemaker transmission.   

## 2022-12-14 NOTE — Patient Instructions (Signed)

## 2022-12-14 NOTE — Progress Notes (Unsigned)
12/14/2022 3:44 PM   [name] 1940-09-14 [id number]  Referring provider: Benita Stabile, MD [address] [place],  [place] [address]  Followup nephrolithiasis   HPI: [name] is a 82yo here for followup for nephrolithiasis. He is doing well after ESWL. He passed numerous fragments and brought them with him. No flank pain.    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   Frequent PVCs & fatigue -> MYOVIEW w/ inferior /r MV distribution --> CATH: only RIMA-LAD patent (LIMA atretic & SVGs occluded) --> Staged PCI of Native RCA & Cx; followed by PCI-distal RCA   CAD S/P percutaneous coronary angioplasty 8/'12; 06/'16   a) PCI- Cx-OM W/ 2 overlapping Resolute DES 3x15 & 3x18 stents (3.3mm),  PCI-RCA -  Promus DES 3x38 (3.27mm);; b) 6/'16: PCI-dRCA-rPAV Promus DES 2.75X28 (3.0 mm) -- stents patent in 01/2018;  01/2018 -stents remain patent   CAS (cerebral atherosclerosis)    CAROTID DOPPLER, 08/25/2011 - mildly abnormal   Chronic back pain    Dilated cardiomyopathy (HCC): EF ~30-35%, 01/05/2018   11/2019 - EF ~30%, global HK with Septal dyssynergy --> 6/6/'21 - s/p CRT-P (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 unclear of etiology    Surgical History: Past Surgical History:  Procedure Laterality Date   BIV PACEMAKER INSERTION CRT-P N/A 02/10/2020   Procedure: BIV PACEMAKER INSERTION CRT-P;  Surgeon: Regan Lemming, MD;  Location: [facility];  Service: Cardiovascular;  Laterality: N/A;   CARDIAC CATHETERIZATION  07/15/2004   CABG recommended; continue medical therapy   CARDIAC CATHETERIZATION  04/26/2011   Patent RIMA-LAD, atretic LIMA-Dx (previous) 100 and CTO SVG to Cx and SVG to PDA. LAD 80-90% eccentric stenosis at SP1 that has 70% stenosis. Cx: Large OM and another distal branch with 90-95% stenosis followed by lesion in the OM branch. RCA dominant 60-80% lesion proximally w/ several old lesions. PDA occluded 70-80% annular lesion in the proximal RCA. 3 of 4 grafts CTO RIMA-LAD patent   CARDIAC CATHETERIZATION N/A 02/20/2015   Procedure: Left Heart Cath and Coronary Angiography;  Surgeon: Marykay Lex, MD;  Location: [facility]: Widely patent p-mRCA DES with dRCA 50%-PAVG 80% --> PCI. Patent overlapping stents --mCx-OM. 99% pLAD - patent RIMA-dLAD.  Known CTO SVG-rPL, SVG-OM3, :LIMA-D1.   CATARACT EXTRACTION W/ INTRAOCULAR LENS  IMPLANT, BILATERAL Bilateral ~ 2006/2007  CORONARY ANGIOPLASTY WITH STENT PLACEMENT  04/28/2011; 02/20/2015   PCI-Cx-OM 3 overlapping Resolute DES 3 mm x7715mm and 3x7918mm --> 3.335mm; PCI to RCA - Promus Element DES 3x8838mm --> 3.5225mm;; b) dRCA-rPAV: Promus Premier DES 2.8275mm X 28 mm (3.0 mm)    CORONARY ARTERY BYPASS GRAFT  07/23/2004   LIMA-Cx RIMA-LAD, SVG-diagonal, SVG-PDA   EXTRACORPOREAL SHOCK WAVE LITHOTRIPSY Right 11/28/2022   Procedure: EXTRACORPOREAL SHOCK WAVE LITHOTRIPSY (ESWL) GAITED;  Surgeon: Heloise PurpuraBorden, Lester, MD;  [facility];  Service: Urology;  Laterality: Right;  90 MINS   RIGHT/LEFT HEART  CATH AND CORONARY/GRAFT ANGIOGRAPHY N/A 01/09/2018   Procedure: RIGHT/LEFT HEART CATH AND CORONARY/GRAFT ANGIOGRAPHY;  Surgeon: Marykay LexHarding, David W, MD;; Known occluded LIMA-Diag & SVG-RCA, SVG-OM.  Mod-severe pLAD that is grafted after D1 - patent RIMA- wraparound LAD, distal 1/2 of PDA territory. Patent Cx-OM DES (ost Cx mild) & p-mRCA overlapping DES & dRCA-RPAV DES.  PCWP 12 mmHg, LVP-EDP: 140/6 - 14 mmH. RAP 2 mmHg. RVP-EDP 33/0 - 5  mmHg   TRANSTHORACIC ECHOCARDIOGRAM  02/18/2015    Mild concentric LVH, EF 55-60%, no RWMA, Gr 1 DD, aortic sclerosis without stenosis   TRANSTHORACIC ECHOCARDIOGRAM  12/2017; 06/2019   (New LBBB)  EF~35% with diffuse HK of septal & lateral walls.  Mildly dilated LV.  "GR 1 "DD with high filling pressures. ? w/ normal LA size; 10/20: EF 30-35%, LBBB related Septal dyskinesis, GR2 DD (but normal LA size).    TRANSTHORACIC ECHOCARDIOGRAM  11/27/2019   (On optimal/max tolerated dose of carvedilol and Entresto): EF estimated 30% with moderate to severe decreased function and global hypokinesis.  Significant septal-lateral dyssynchrony from LBBB.  Mild LV dilation.  At least GR 1 DD.  Mildly reduced RV function but normal pressures.  Mild aortic sclerosis but no significant regurgitation.   TRANSTHORACIC ECHOCARDIOGRAM  09/14/2020   (s/p CRT-P) EF estimated 30%. Moderately decreased function with global HK. GR 1 DD. Mildly reduced RV function. Mild aortic valve thickening/sclerosis but no stenosis. (Compared to prior study, no notable change in fxn, but decreased LV r dilation with slight decrease in RV function).   TRANSURETHRAL RESECTION OF PROSTATE  ~ 2002/2003    Home Medications:  Allergies as of 12/14/2022       Reactions   Meperidine Hcl    Cold sweats, dizziness    Penicillins    Severe headaches Has patient had a PCN reaction causing immediate rash, facial/tongue/throat swelling, SOB or lightheadedness with hypotension: No Has patient had a PCN reaction causing  severe rash involving mucus membranes or skin necrosis: No Has patient had a PCN reaction that required hospitalization: No Has patient had a PCN reaction occurring within the last 10 years: No If all of the above answers are "NO", then may proceed with Cephalosporin use.        Medication List        Accurate as of December 14, 2022  3:44 PM. If you have any questions, ask your nurse or doctor.          ALPRAZolam 0.5 MG tablet Commonly known as: XANAX Take 0.5 mg by mouth 2 (two) times daily as needed for anxiety (tremors).   aspirin EC 81 MG tablet Take 81 mg by mouth daily.   bisoprolol 5 MG tablet Commonly known as: ZEBETA TAKE 1 TABLET BY MOUTH IN THE MORNING AND 1 AT BEDTIME OR  AS  DIRECTED What changed: See the new instructions.   chlorzoxazone 500 MG tablet Commonly known  as: PARAFON Take 500 mg by mouth daily as needed for muscle spasms.   furosemide 20 MG tablet Commonly known as: LASIX Take 1 tablet (20 mg total) by mouth as needed for edema.   HYDROcodone-acetaminophen 5-325 MG tablet Commonly known as: NORCO/VICODIN Take 1 tablet by mouth every 6 (six) hours as needed for severe pain.   ondansetron 4 MG tablet Commonly known as: Zofran Take 1 tablet (4 mg total) by mouth every 8 (eight) hours as needed for nausea or vomiting.   spironolactone 25 MG tablet Commonly known as: ALDACTONE Take 0.5 tablets (12.5 mg total) by mouth daily.   Symbicort 160-4.5 MCG/ACT inhaler Generic drug: budesonide-formoterol Inhale 2 puffs into the lungs in the morning and at bedtime.   SYSTANE OP Place 1 drop into both eyes 2 (two) times daily.   tamsulosin 0.4 MG Caps capsule Commonly known as: FLOMAX Take 1 capsule (0.4 mg total) by mouth daily after supper.   Vytorin 10-10 MG tablet Generic drug: ezetimibe-simvastatin Take 1 tablet by mouth at bedtime.        Allergies:  Allergies  Allergen Reactions   Meperidine Hcl     Cold sweats, dizziness     Penicillins     Severe headaches Has patient had a PCN reaction causing immediate rash, facial/tongue/throat swelling, SOB or lightheadedness with hypotension: No Has patient had a PCN reaction causing severe rash involving mucus membranes or skin necrosis: No Has patient had a PCN reaction that required hospitalization: No Has patient had a PCN reaction occurring within the last 10 years: No If all of the above answers are "NO", then may proceed with Cephalosporin use.     Family History: Family History  Problem Relation Age of Onset   Heart disease Father 77   Heart disease Brother    Colon cancer Brother        and liver cancer   Stroke Mother 31   Hypertension Mother 13   Stomach cancer Sister    Hypertension Brother 65   Esophageal cancer Neg Hx    Rectal cancer Neg Hx     Social History:  reports that he quit smoking about 59 years ago. His smoking use included cigarettes. He has a 7.00 pack-year smoking history. He has never used smokeless tobacco. He reports that he does not drink alcohol and does not use drugs.  ROS: All other review of systems were reviewed and are negative except what is noted above in HPI  Physical Exam: BP (!) 150/63   Pulse 61   Constitutional:  Alert and oriented, No acute distress. HEENT: [place] AT, moist mucus membranes.  Trachea midline, no masses. Cardiovascular: No clubbing, cyanosis, or edema. Respiratory: Normal respiratory effort, no increased work of breathing. GI: Abdomen is soft, nontender, nondistended, no abdominal masses GU: No CVA tenderness.  Lymph: No cervical or inguinal lymphadenopathy. Skin: No rashes, bruises or suspicious lesions. Neurologic: Grossly intact, no focal deficits, moving all 4 extremities. Psychiatric: Normal mood and affect.  Laboratory Data: Lab Results  Component Value Date   WBC 19.8 (H) 10/22/2022   HGB 15.3 10/22/2022   HCT 46.7 10/22/2022   MCV 86.2 10/22/2022   PLT 246 10/22/2022    Lab  Results  Component Value Date   CREATININE 1.17 12/09/2022    No results found for: "PSA"  No results found for: "TESTOSTERONE"  No results found for: "HGBA1C"  Urinalysis    Component Value Date/Time   COLORURINE YELLOW 10/22/2022 0950   APPEARANCEUR  Clear 11/08/2022 1510   LABSPEC 1.020 10/22/2022 0950   PHURINE 5.0 10/22/2022 0950   GLUCOSEU Negative 11/08/2022 1510   HGBUR LARGE (A) 10/22/2022 0950   BILIRUBINUR Negative 11/08/2022 1510   KETONESUR NEGATIVE 10/22/2022 0950   PROTEINUR 2+ (A) 11/08/2022 1510   PROTEINUR 100 (A) 10/22/2022 0950   UROBILINOGEN 1.0 03/06/2014 1406   NITRITE Negative 11/08/2022 1510   NITRITE NEGATIVE 10/22/2022 0950   LEUKOCYTESUR Negative 11/08/2022 1510   LEUKOCYTESUR NEGATIVE 10/22/2022 0950    Lab Results  Component Value Date   LABMICR See below: 11/08/2022   WBCUA 0-5 11/08/2022   LABEPIT 0-10 11/08/2022   BACTERIA None seen 11/08/2022    Pertinent Imaging: *** Results for orders placed during the hospital encounter of 11/28/22  DG Abd 1 View  Narrative CLINICAL DATA:  Several calcifications  EXAM: ABDOMEN - 1 VIEW  COMPARISON:  11/08/2022.  FINDINGS: Measuring 3-4 mm overlie both kidneys. Unremarkable bowel gas pattern.  IMPRESSION: Bilateral nephrolithiasis.   Electronically Signed By: Layla Maw M.D. On: 11/29/2022 13:33  No results found for this or any previous visit.  No results found for this or any previous visit.  No results found for this or any previous visit.  No results found for this or any previous visit.  No valid procedures specified. No results found for this or any previous visit.  Results for orders placed during the hospital encounter of 10/22/22  CT Renal Stone Study  Narrative CLINICAL DATA:  Flank pain  EXAM: CT ABDOMEN AND PELVIS WITHOUT CONTRAST  TECHNIQUE: Multidetector CT imaging of the abdomen and pelvis was performed following the standard protocol  without IV contrast.  RADIATION DOSE REDUCTION: This exam was performed according to the departmental dose-optimization program which includes automated exposure control, adjustment of the mA and/or kV according to patient size and/or use of iterative reconstruction technique.  COMPARISON:  07/01/2022  FINDINGS: Lower chest: No acute abnormality.  There are pacer wires.  Hepatobiliary: No focal liver abnormality is seen. No gallstones, gallbladder wall thickening, or biliary dilatation.  Pancreas: Unremarkable. No pancreatic ductal dilatation or surrounding inflammatory changes.  Spleen: Normal in size without focal abnormality.  Adrenals/Urinary Tract: Bilateral nephrolithiasis identified. Largest stone is 5 mm right midpole. Largest stone on the left is 4 mm mid to lower pole. There is right-sided perinephric stranding and hydronephrosis with a proximal right ureteral stone that measures 8 mm.  Stomach/Bowel: Stomach is within normal limits. Appendix appears normal. No evidence of bowel wall thickening, distention, or inflammatory changes. Sigmoid diverticulosis identified without diverticulitis.  Vascular/Lymphatic: Aortic atherosclerosis. No enlarged abdominal or pelvic lymph nodes. Retroaortic left renal vein noted.  Reproductive: Prostate is unremarkable.  Other: No abdominal wall hernia or abnormality. No abdominopelvic ascites.  Musculoskeletal: No acute or significant osseous findings. L5-S1 disc space narrowing and sclerosis consistent with degenerative disc disease.  IMPRESSION: 1. Bilateral nephrolithiasis. 2. Proximal right ureteral 8 mm stone with obstruction. 3. Diverticulosis.   Electronically Signed By: Layla Maw M.D. On: 10/22/2022 09:11   Assessment & Plan:    1. Kidney stones Followup 6 weeks with renal US - Urinalysis, Routine w reflex microscopic - Calculi, with Photograph (to Clinical Lab)   No follow-ups on file.  Wilkie Aye, MD  [facility] [place]

## 2022-12-15 LAB — URINALYSIS, ROUTINE W REFLEX MICROSCOPIC
Bilirubin, UA: NEGATIVE
Glucose, UA: NEGATIVE
Ketones, UA: NEGATIVE
Leukocytes,UA: NEGATIVE
Nitrite, UA: NEGATIVE
RBC, UA: NEGATIVE
Specific Gravity, UA: 1.025 (ref 1.005–1.030)
Urobilinogen, Ur: 0.2 mg/dL (ref 0.2–1.0)
pH, UA: 5 (ref 5.0–7.5)

## 2022-12-20 LAB — CALCULI, WITH PHOTOGRAPH (CLINICAL LAB)
Calcium Oxalate Monohydrate: 100 %
Weight Calculi: 195 mg

## 2022-12-21 ENCOUNTER — Ambulatory Visit: Payer: Medicare Other | Admitting: Cardiology

## 2023-01-03 DIAGNOSIS — I493 Ventricular premature depolarization: Secondary | ICD-10-CM | POA: Diagnosis not present

## 2023-01-13 ENCOUNTER — Ambulatory Visit (HOSPITAL_COMMUNITY)
Admission: RE | Admit: 2023-01-13 | Discharge: 2023-01-13 | Disposition: A | Discharge status: Home / Self Care | Payer: Medicare Other | Source: Ambulatory Visit | Attending: Urology | Admitting: Urology

## 2023-01-13 DIAGNOSIS — N200 Calculus of kidney: Secondary | ICD-10-CM | POA: Diagnosis not present

## 2023-01-13 DIAGNOSIS — N281 Cyst of kidney, acquired: Secondary | ICD-10-CM | POA: Diagnosis not present

## 2023-01-16 ENCOUNTER — Encounter: Payer: Self-pay | Admitting: Urology

## 2023-01-16 ENCOUNTER — Ambulatory Visit (INDEPENDENT_AMBULATORY_CARE_PROVIDER_SITE_OTHER): Payer: Medicare Other | Admitting: Urology

## 2023-01-16 VITALS — BP 125/65 | HR 80

## 2023-01-16 DIAGNOSIS — N200 Calculus of kidney: Secondary | ICD-10-CM

## 2023-01-16 LAB — URINALYSIS, ROUTINE W REFLEX MICROSCOPIC
Bilirubin, UA: NEGATIVE
Glucose, UA: NEGATIVE
Ketones, UA: NEGATIVE
Leukocytes,UA: NEGATIVE
Nitrite, UA: NEGATIVE
Protein,UA: NEGATIVE
RBC, UA: NEGATIVE
Specific Gravity, UA: 1.015 (ref 1.005–1.030)
Urobilinogen, Ur: 0.2 mg/dL (ref 0.2–1.0)
pH, UA: 6 (ref 5.0–7.5)

## 2023-01-16 NOTE — Patient Instructions (Signed)

## 2023-01-16 NOTE — Progress Notes (Unsigned)
01/16/2023 2:48 PM   [name] 1941-01-07 [id number]  Referring provider: Benita Stabile, MD [address] [place],  [place] [address]  Followup nephrolithiasis   HPI: Mr Craver is a 82yo here for followup for nephrolithiasis No stone events since last visit. Renal US shows no hydronephrosis. He has left lower pole calculus and a right 5cm renal cyst.   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   Frequent PVCs & fatigue -> MYOVIEW w/ inferior /r MV distribution --> CATH: only RIMA-LAD patent (LIMA atretic & SVGs occluded) --> Staged PCI of Native RCA & Cx; followed by PCI-distal RCA   CAD S/P percutaneous coronary angioplasty 8/'12; 06/'16   a) PCI- Cx-OM W/ 2 overlapping Resolute DES 3x15 & 3x18 stents (3.68mm),  PCI-RCA -  Promus DES 3x38 (3.69mm);; b) 6/'16: PCI-dRCA-rPAV Promus DES 2.75X28 (3.0 mm) -- stents patent in 01/2018;  01/2018 -stents remain patent   CAS (cerebral atherosclerosis)    CAROTID DOPPLER, 08/25/2011 - mildly abnormal   Chronic back pain    Dilated cardiomyopathy (HCC): EF ~30-35%, 01/05/2018   11/2019 - EF ~30%, global HK with Septal dyssynergy --> 6/6/'21 - s/p CRT-P ([employer] 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 unclear of etiology    Surgical History: Past Surgical History:  Procedure Laterality Date   BIV PACEMAKER INSERTION CRT-P N/A 02/10/2020   Procedure: BIV PACEMAKER INSERTION CRT-P;  Surgeon: Regan Lemming, MD;  Location: [facility];  Service: Cardiovascular;  Laterality: N/A;   CARDIAC CATHETERIZATION  07/15/2004   CABG recommended; continue medical therapy   CARDIAC CATHETERIZATION  04/26/2011   Patent RIMA-LAD, atretic LIMA-Dx (previous) 100 and CTO SVG to Cx and SVG to PDA. LAD 80-90% eccentric stenosis at SP1 that has 70% stenosis. Cx: Large OM and another distal branch with 90-95% stenosis followed by lesion in the OM branch. RCA dominant 60-80% lesion proximally w/ several old lesions. PDA occluded 70-80% annular lesion in the proximal RCA. 3 of 4 grafts CTO RIMA-LAD patent   CARDIAC CATHETERIZATION N/A 02/20/2015   Procedure: Left Heart Cath and Coronary Angiography;  Surgeon: Marykay Lex, MD;  Location: [facility]: Widely patent p-mRCA DES with dRCA 50%-PAVG 80% --> PCI. Patent overlapping stents --mCx-OM. 99% pLAD - patent RIMA-dLAD.  Known CTO SVG-rPL, SVG-OM3, :LIMA-D1.   CATARACT EXTRACTION W/ INTRAOCULAR LENS  IMPLANT,  BILATERAL Bilateral ~ 2006/2007   CORONARY ANGIOPLASTY WITH STENT PLACEMENT  04/28/2011; 02/20/2015   PCI-Cx-OM 3 overlapping Resolute DES 3 mm x29mm and 3x73mm --> 3.16mm; PCI to RCA - Promus Element DES 3x51mm --> 3.38mm;; b) dRCA-rPAV: Promus Premier DES 2.40mm X 28 mm (3.0 mm)    CORONARY ARTERY BYPASS GRAFT  07/23/2004   LIMA-Cx RIMA-LAD, SVG-diagonal, SVG-PDA   EXTRACORPOREAL SHOCK WAVE LITHOTRIPSY Right 11/28/2022   Procedure: EXTRACORPOREAL SHOCK WAVE LITHOTRIPSY (ESWL) GAITED;  Surgeon: Heloise Purpura, MD;  Location[facility];  Service: Urology;  Laterality: Right;  90  MINS   RIGHT/LEFT HEART CATH AND CORONARY/GRAFT ANGIOGRAPHY N/A 01/09/2018   Procedure: RIGHT/LEFT HEART CATH AND CORONARY/GRAFT ANGIOGRAPHY;  Surgeon: Marykay Lex, MD;; Known occluded LIMA-Diag & SVG-RCA, SVG-OM.  Mod-severe pLAD that is grafted after D1 - patent RIMA- wraparound LAD, distal 1/2 of PDA territory. Patent Cx-OM DES (ost Cx mild) & p-mRCA overlapping DES & dRCA-RPAV DES.  PCWP 12 mmHg, LVP-EDP: 140/6 - 14 mmH. RAP 2 mmHg. RVP-EDP 33/0 - 5  mmHg   TRANSTHORACIC ECHOCARDIOGRAM  02/18/2015    Mild concentric LVH, EF 55-60%, no RWMA, Gr 1 DD, aortic sclerosis without stenosis   TRANSTHORACIC ECHOCARDIOGRAM  12/2017; 06/2019   (New LBBB)  EF~35% with diffuse HK of septal & lateral walls.  Mildly dilated LV.  "GR 1 "DD with high filling pressures. ? w/ normal LA size; 10/20: EF 30-35%, LBBB related Septal dyskinesis, GR2 DD (but normal LA size).    TRANSTHORACIC ECHOCARDIOGRAM  11/27/2019   (On optimal/max tolerated dose of carvedilol and Entresto): EF estimated 30% with moderate to severe decreased function and global hypokinesis.  Significant septal-lateral dyssynchrony from LBBB.  Mild LV dilation.  At least GR 1 DD.  Mildly reduced RV function but normal pressures.  Mild aortic sclerosis but no significant regurgitation.   TRANSTHORACIC ECHOCARDIOGRAM  09/14/2020   (s/p CRT-P) EF estimated 30%. Moderately decreased function with global HK. GR 1 DD. Mildly reduced RV function. Mild aortic valve thickening/sclerosis but no stenosis. (Compared to prior study, no notable change in fxn, but decreased LV r dilation with slight decrease in RV function).   TRANSURETHRAL RESECTION OF PROSTATE  ~ 2002/2003    Home Medications:  Allergies as of 01/16/2023       Reactions   Meperidine Hcl    Cold sweats, dizziness    Penicillins    Severe headaches Has patient had a PCN reaction causing immediate rash, facial/tongue/throat swelling, SOB or lightheadedness with hypotension: No Has patient  had a PCN reaction causing severe rash involving mucus membranes or skin necrosis: No Has patient had a PCN reaction that required hospitalization: No Has patient had a PCN reaction occurring within the last 10 years: No If all of the above answers are "NO", then may proceed with Cephalosporin use.        Medication List        Accurate as of Jan 16, 2023  2:48 PM. If you have any questions, ask your nurse or doctor.          ALPRAZolam 0.5 MG tablet Commonly known as: XANAX Take 0.5 mg by mouth 2 (two) times daily as needed for anxiety (tremors).   aspirin EC 81 MG tablet Take 81 mg by mouth daily.   bisoprolol 5 MG tablet Commonly known as: ZEBETA TAKE 1 TABLET BY MOUTH IN THE MORNING AND 1 AT BEDTIME OR  AS  DIRECTED What changed: See the new instructions.  chlorzoxazone 500 MG tablet Commonly known as: PARAFON Take 500 mg by mouth daily as needed for muscle spasms.   furosemide 20 MG tablet Commonly known as: LASIX Take 1 tablet (20 mg total) by mouth as needed for edema.   HYDROcodone-acetaminophen 5-325 MG tablet Commonly known as: NORCO/VICODIN Take 1 tablet by mouth every 6 (six) hours as needed for severe pain.   ondansetron 4 MG tablet Commonly known as: Zofran Take 1 tablet (4 mg total) by mouth every 8 (eight) hours as needed for nausea or vomiting.   spironolactone 25 MG tablet Commonly known as: ALDACTONE Take 0.5 tablets (12.5 mg total) by mouth daily.   Symbicort 160-4.5 MCG/ACT inhaler Generic drug: budesonide-formoterol Inhale 2 puffs into the lungs in the morning and at bedtime.   SYSTANE OP Place 1 drop into both eyes 2 (two) times daily.   tamsulosin 0.4 MG Caps capsule Commonly known as: FLOMAX Take 1 capsule (0.4 mg total) by mouth daily after supper.   Vytorin 10-10 MG tablet Generic drug: ezetimibe-simvastatin Take 1 tablet by mouth at bedtime.        Allergies:  Allergies  Allergen Reactions   Meperidine Hcl     Cold  sweats, dizziness    Penicillins     Severe headaches Has patient had a PCN reaction causing immediate rash, facial/tongue/throat swelling, SOB or lightheadedness with hypotension: No Has patient had a PCN reaction causing severe rash involving mucus membranes or skin necrosis: No Has patient had a PCN reaction that required hospitalization: No Has patient had a PCN reaction occurring within the last 10 years: No If all of the above answers are "NO", then may proceed with Cephalosporin use.     Family History: Family History  Problem Relation Age of Onset   Heart disease Father 22   Heart disease Brother    Colon cancer Brother        and liver cancer   Stroke Mother 63   Hypertension Mother 82   Stomach cancer Sister    Hypertension Brother 54   Esophageal cancer Neg Hx    Rectal cancer Neg Hx     Social History:  reports that he quit smoking about 59 years ago. His smoking use included cigarettes. He has a 7.00 pack-year smoking history. He has never used smokeless tobacco. He reports that he does not drink alcohol and does not use drugs.  ROS: All other review of systems were reviewed and are negative except what is noted above in HPI  Physical Exam: BP 125/65   Pulse 80   Constitutional:  Alert and oriented, No acute distress. HEENT:  AT, moist mucus membranes.  Trachea midline, no masses. Cardiovascular: No clubbing, cyanosis, or edema. Respiratory: Normal respiratory effort, no increased work of breathing. GI: Abdomen is soft, nontender, nondistended, no abdominal masses GU: No CVA tenderness.  Lymph: No cervical or inguinal lymphadenopathy. Skin: No rashes, bruises or suspicious lesions. Neurologic: Grossly intact, no focal deficits, moving all 4 extremities. Psychiatric: Normal mood and affect.  Laboratory Data: Lab Results  Component Value Date   WBC 19.8 (H) 10/22/2022   HGB 15.3 10/22/2022   HCT 46.7 10/22/2022   MCV 86.2 10/22/2022   PLT 246  10/22/2022    Lab Results  Component Value Date   CREATININE 1.17 12/09/2022    No results found for: "PSA"  No results found for: "TESTOSTERONE"  No results found for: "HGBA1C"  Urinalysis    Component Value Date/Time   COLORURINE YELLOW  10/22/2022 0950   APPEARANCEUR Clear 12/14/2022 1543   LABSPEC 1.020 10/22/2022 0950   PHURINE 5.0 10/22/2022 0950   GLUCOSEU Negative 12/14/2022 1543   HGBUR LARGE (A) 10/22/2022 0950   BILIRUBINUR Negative 12/14/2022 1543   KETONESUR NEGATIVE 10/22/2022 0950   PROTEINUR Trace 12/14/2022 1543   PROTEINUR 100 (A) 10/22/2022 0950   UROBILINOGEN 1.0 03/06/2014 1406   NITRITE Negative 12/14/2022 1543   NITRITE NEGATIVE 10/22/2022 0950   LEUKOCYTESUR Negative 12/14/2022 1543   LEUKOCYTESUR NEGATIVE 10/22/2022 0950    Lab Results  Component Value Date   LABMICR Comment 12/14/2022   WBCUA 0-5 11/08/2022   LABEPIT 0-10 11/08/2022   BACTERIA None seen 11/08/2022    Pertinent Imaging: Renal US 01/13/2023: Images reviewed and discussed with the patient  Results for orders placed during the hospital encounter of 11/28/22  DG Abd 1 View  Narrative CLINICAL DATA:  Several calcifications  EXAM: ABDOMEN - 1 VIEW  COMPARISON:  11/08/2022.  FINDINGS: Measuring 3-4 mm overlie both kidneys. Unremarkable bowel gas pattern.  IMPRESSION: Bilateral nephrolithiasis.   Electronically Signed By: Layla Maw M.D. On: 11/29/2022 13:33  No results found for this or any previous visit.  No results found for this or any previous visit.  No results found for this or any previous visit.  Results for orders placed during the hospital encounter of 01/13/23  Ultrasound renal complete  Narrative CLINICAL DATA:  Nephrolithiasis follow-up  EXAM: RENAL / URINARY TRACT ULTRASOUND COMPLETE  COMPARISON:  None Available.  FINDINGS: Right Kidney:  Renal measurements: 10.6 x 5.5 x 7.0 cm = volume: 212 mL. Contains a 4.9 cm cyst. No  follow-up imaging recommended for the cyst.  Left Kidney:  Renal measurements: 10.9 x 5.8 x 6.2 cm = volume: 205 mL. Two or 3 echogenic shadowing foci are identified with the largest measuring 5.8 mm. No hydronephrosis.  Bladder:  Appears normal for degree of bladder distention.  Other:  None.  IMPRESSION: 1. Suspected small nonobstructive stones in the left kidney. 2. No other significant abnormalities.   Electronically Signed By: Gerome Sam III M.D. On: 01/13/2023 18:09  No valid procedures specified. No results found for this or any previous visit.  Results for orders placed during the hospital encounter of 10/22/22  CT Renal Stone Study  Narrative CLINICAL DATA:  Flank pain  EXAM: CT ABDOMEN AND PELVIS WITHOUT CONTRAST  TECHNIQUE: Multidetector CT imaging of the abdomen and pelvis was performed following the standard protocol without IV contrast.  RADIATION DOSE REDUCTION: This exam was performed according to the departmental dose-optimization program which includes automated exposure control, adjustment of the mA and/or kV according to patient size and/or use of iterative reconstruction technique.  COMPARISON:  07/01/2022  FINDINGS: Lower chest: No acute abnormality.  There are pacer wires.  Hepatobiliary: No focal liver abnormality is seen. No gallstones, gallbladder wall thickening, or biliary dilatation.  Pancreas: Unremarkable. No pancreatic ductal dilatation or surrounding inflammatory changes.  Spleen: Normal in size without focal abnormality.  Adrenals/Urinary Tract: Bilateral nephrolithiasis identified. Largest stone is 5 mm right midpole. Largest stone on the left is 4 mm mid to lower pole. There is right-sided perinephric stranding and hydronephrosis with a proximal right ureteral stone that measures 8 mm.  Stomach/Bowel: Stomach is within normal limits. Appendix appears normal. No evidence of bowel wall thickening, distention,  or inflammatory changes. Sigmoid diverticulosis identified without diverticulitis.  Vascular/Lymphatic: Aortic atherosclerosis. No enlarged abdominal or pelvic lymph nodes. Retroaortic left renal vein noted.  Reproductive:  Prostate is unremarkable.  Other: No abdominal wall hernia or abnormality. No abdominopelvic ascites.  Musculoskeletal: No acute or significant osseous findings. L5-S1 disc space narrowing and sclerosis consistent with degenerative disc disease.  IMPRESSION: 1. Bilateral nephrolithiasis. 2. Proximal right ureteral 8 mm stone with obstruction. 3. Diverticulosis.   Electronically Signed By: Layla Maw M.D. On: 10/22/2022 09:11   Assessment & Plan:    1. Kidney stones Followup 6 months with renal US - Urinalysis, Routine w reflex microscopic   No follow-ups on file.  Wilkie Aye, MD  [facility] [place]

## 2023-01-24 ENCOUNTER — Encounter: Payer: Self-pay | Admitting: Cardiology

## 2023-01-24 ENCOUNTER — Ambulatory Visit: Payer: Medicare Other | Attending: Cardiology | Admitting: Cardiology

## 2023-01-24 VITALS — BP 122/62 | HR 66 | Ht 68.0 in | Wt 177.0 lb

## 2023-01-24 DIAGNOSIS — I5042 Chronic combined systolic (congestive) and diastolic (congestive) heart failure: Secondary | ICD-10-CM

## 2023-01-24 DIAGNOSIS — I25708 Atherosclerosis of coronary artery bypass graft(s), unspecified, with other forms of angina pectoris: Secondary | ICD-10-CM

## 2023-01-24 DIAGNOSIS — I493 Ventricular premature depolarization: Secondary | ICD-10-CM | POA: Diagnosis not present

## 2023-01-24 DIAGNOSIS — T466X5A Adverse effect of antihyperlipidemic and antiarteriosclerotic drugs, initial encounter: Secondary | ICD-10-CM | POA: Diagnosis not present

## 2023-01-24 DIAGNOSIS — I420 Dilated cardiomyopathy: Secondary | ICD-10-CM | POA: Diagnosis not present

## 2023-01-24 DIAGNOSIS — M791 Myalgia, unspecified site: Secondary | ICD-10-CM | POA: Diagnosis not present

## 2023-01-24 DIAGNOSIS — R600 Localized edema: Secondary | ICD-10-CM | POA: Diagnosis not present

## 2023-01-24 DIAGNOSIS — I951 Orthostatic hypotension: Secondary | ICD-10-CM | POA: Diagnosis not present

## 2023-01-24 DIAGNOSIS — E785 Hyperlipidemia, unspecified: Secondary | ICD-10-CM | POA: Diagnosis not present

## 2023-01-24 DIAGNOSIS — Z9861 Coronary angioplasty status: Secondary | ICD-10-CM | POA: Insufficient documentation

## 2023-01-24 DIAGNOSIS — I10 Essential (primary) hypertension: Secondary | ICD-10-CM | POA: Diagnosis not present

## 2023-01-24 DIAGNOSIS — I447 Left bundle-branch block, unspecified: Secondary | ICD-10-CM | POA: Diagnosis not present

## 2023-01-24 DIAGNOSIS — I2510 Atherosclerotic heart disease of native coronary artery without angina pectoris: Secondary | ICD-10-CM | POA: Diagnosis not present

## 2023-01-24 MED ORDER — BISOPROLOL-HYDROCHLOROTHIAZIDE 2.5-6.25 MG PO TABS: 1.0000 | ORAL_TABLET | Freq: Every morning | ORAL | 3 refills | Status: DC

## 2023-01-24 MED ORDER — BISOPROLOL FUMARATE 5 MG PO TABS: 5.0000 mg | ORAL_TABLET | Freq: Every evening | ORAL | 3 refills | Status: DC

## 2023-01-24 NOTE — Progress Notes (Signed)
Primary Care Provider: Benita Stabile, MD [facility] Cardiologist: Bryan Lemma, MD Electrophysiologist: Will [name], MD  Clinic Note: Chief Complaint  Patient presents with   Follow-up    PVCs.   Shortness of Breath   Coronary Artery Disease    No active angina   Cardiomyopathy    No real CHF symptoms.   ===================================  ASSESSMENT/PLAN   Problem List Items Addressed This Visit       Cardiology Problems   PVC's (premature ventricular contractions) (Chronic)    Frequent PVCs noted on monitor recently.  He is on pretty much max tolerated dose of beta-blocker.  He thinks that his symptoms are much improved and he is not really feeling palpitations now.  At this point I am reluctant to do a thing else.  He is due to follow-up with Dr. Elberta Fortis so I will send this note to him as well.  Not sure how he would tolerate antiarrhythmics, but I am a little bit concerned with the PVC burden.  1 consideration will be to reassess a monitor in a few months to see if the PVCs have indeed calm down with his recovery from COVID and kidney stones.      Relevant Medications   bisoprolol-hydrochlorothiazide (ZIAC) 2.5-6.25 MG tablet   bisoprolol (ZEBETA) 5 MG tablet   Orthostatic hypotension - with tremor (Chronic)    In the past he has had orthostatic hypotension with significant drops in blood pressure WHICH IS THE REASON WHY WE HAVE NOT USED AGGRESSIVE BLOOD PRESSURE MANAGEMENT WITH ARB/ACE INHIBITOR OR ARNI      Relevant Medications   bisoprolol-hydrochlorothiazide (ZIAC) 2.5-6.25 MG tablet   bisoprolol (ZEBETA) 5 MG tablet   Hyperlipidemia with target LDL less than 70 (Chronic)    This is a known untreated cardiac risk factor.  For many years we have discussed management options and he is reluctant to try anything new.  He understands the risk of not treating his lipids but not willing to sacrifice lifestyle/quality of life while trying new  medications.  Does not want the cost of new medications such as Praluent or Repatha..      Relevant Medications   bisoprolol-hydrochlorothiazide (ZIAC) 2.5-6.25 MG tablet   bisoprolol (ZEBETA) 5 MG tablet   Essential hypertension - Primary (Chronic)   Relevant Medications   bisoprolol-hydrochlorothiazide (ZIAC) 2.5-6.25 MG tablet   bisoprolol (ZEBETA) 5 MG tablet   Other Relevant Orders   EKG 12-Lead (Completed)   Dilated cardiomyopathy (HCC): EF ~35%, new LBBB - no change to Coronary Anatony on Cath (Chronic)    EF did not improve after CRT-P but symptoms did improve.  Euvolemic on exam.   GDMT management very limited by his intolerance of medications.  He is currently now on beta-blocker with little dose of HCTZ as well as spironolactone.  In the past did not tolerate ARB/ACE inhibitor/ARNI's. Could consider Jardiance or Marcelline Deist, but with his lack of diuretic requirement, not sure how much benefit he would get.  He is reluctant to try new medications.      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  Now has BiV CRT-P placement.      Relevant Medications   bisoprolol-hydrochlorothiazide (ZIAC) 2.5-6.25 MG tablet   bisoprolol (ZEBETA) 5 MG tablet   Chronic combined systolic and diastolic  heart failure (HCC) (Chronic)    NYHA class IIa symptoms.  He really does note some exertional dyspnea but is very deconditioned.  He stopped being very active when he retired and has gotten more deconditioned with unstable gait.  No edema no PND orthopnea.  Euvolemic on exam.  Rarely uses PRN Lasix.  He seems to be doing okay on the spironolactone which so we will continue it along with the morning dose of bisoprolol-HCTZ.  Continue the nighttime dose of bisoprolol. We had previously tried to get him on Entresto  and he did not even tolerate taking half of the low-dose tablet once a day.  Similarly, he was unable to tolerate ARB's or ACE inhibitor's in the past.  Would not rechallenge.  As long as he remains stable we will continue to leave medication regimen alone.      Relevant Medications   bisoprolol-hydrochlorothiazide (ZIAC) 2.5-6.25 MG tablet   bisoprolol (ZEBETA) 5 MG tablet   CAD S/P  PCI of Cx-OM w/ 2 Resolute DES 3.0 x 15 & 3.0 x 18 (3.78mm); PCI-pRCA: Promus Element DES 3.0 x 38 (3.30mm); dRCA-rPAV: Promus Premier DES 2.75x28 (3 mm) (Chronic)    RIMA to LAD patent other grafts closed.  LCx, RCA and RPA V have always stented.  No longer having angina.  PVCs are concerned, but they seem to be improved. Essentially he really is only on the aspirin and beta-blocker.  Was able to tolerate slightly increased dose of beta-blocker continue total of 7.5 mg bisoprolol daily with a 2.5 mg the morning and 5 in the evening.      Relevant Medications   bisoprolol-hydrochlorothiazide (ZIAC) 2.5-6.25 MG tablet   bisoprolol (ZEBETA) 5 MG tablet   CAD (coronary artery disease) of artery bypass graft -atretic LIMA- circumflex, occluded SVG-RCA and SVG-diagonal. (Chronic)    No recurrent anginal symptoms.  At this point the plan will be to hold off on any further ischemic evaluations unless he does have worsening symptoms.  Plan:  With PVC burden, I did try to increase up to 5 mg twice daily bisoprolol, he is not quite able to do it.  He takes the bisoprolol-HCTZ 2.5 mg - 6.25 mg in the morning and takes the 5 mg bisoprolol at night.  This keeps the swelling down.-We will simply continue current regimen states he is feeling better now. Continue spironolactone. Continue aspirin. Not able to tolerate pravastatin in the past but now he has Vytorin listed.  There was a rechallenge by PCP he did not take more than 3 days.       Relevant Medications   bisoprolol-hydrochlorothiazide (ZIAC) 2.5-6.25 MG tablet    bisoprolol (ZEBETA) 5 MG tablet     Other   Myalgia due to statin (Chronic)    Completely intolerant of atorvastatin, rosuvastatin, simvastatin, pravastatin.  But started on Vytorin by PCP but did not tolerate.  Also did not tolerate Zetia  He is not interested in trying any new medications.      Bilateral lower extremity edema (Chronic)    Much better controlled.  He is rarely using Lasix now.       ===================================  HPI:    [name] is a 82 y.o. male with a PMH below who presents today for annual follow-up.  CAD->CABG 07/2004 (Sx = fatigue & PVCs) --> CATH -> MV CAD --> CABG X 4 (RIMA-LAD, LIMA-Diag, SVG-OM, SVG-RCA) 04/2011 - recurrent Sx -> Myoview + for Inf Ischemia --> Cath => Only RIMA-LAD patent -->  DES PCI of Native Cx-OM (2 overlapping Promus DES) & Native RCA June 2016 - DES PCI of the dRCA-RPAV. Since then he has done very well with no active anginal symptoms. COMBINED SYSTOLIC/DIASTOLIC CHF / ISCHEMIC-NON-ISCHEMIC CM 2/2 LBBB 12/2017 - LBBB--> ECHO EF ~35% -> Cath 01/2018 w/ patent stents & RIMA-LAD. EF ~35-40%. Normal RHC (PCWP 12 mmHg, LVEDP 14 mmHg) 06/2019 - Echo EF down to 30-35% - referred to Dr. Elberta Fortis for CRT (P vs. D) -> delayed in order to"OPTIMIZE Med  Rx"  -- attemtped to place on Carvedilol & Entreso (has never been able totoalerate any meds - BB or ARB in the past)       CRT-P (not Defib b/c felt that with CRT, EF could improve) -St Jude BiVPPM => nominal improvement in E level  Following CRT-P-- he stopped taking Entresto & was converted to Bisoprolol 2.5 mg.   I last saw [name] was on December 06, 2021 for 26-month follow-up.  Actually he was doing fairly well.  Not very active as far as exercise, but always on the go.  Upset because of not losing weight.  Definitely noted some slowing down but overall stable.  Not really noticing the palpitations or PVCs.  He definitely felt symptomatically better with the pacemaker in place despite the  fact the EF did not improve.  No PND orthopnea.  No requirement for PRN Lasix.  He was taking 2.5-6.25 mg bisoprolol-HCTZ in the morning and bisoprolol 2.5 mg in the evening and that Promus His swelling at bay.  We have tried pravastatin and he did not tolerate it.  Indicated that he did not want to take any of the statins.  He also indicated that he was not interested in PCSK9 inhibitors or inclisiran.  He was seen by a Dr. Graciela Husbands on March 30 as a work in patient to discuss clearance for nephrolithiasis lithotripsy.  He noted that Yeray and had an episode of COVID-19, had more frequent than usual PVCs as well as leg weakness. He noted that he was hypertensive during this visit so he added spironolactone and recommended ARB/Entresto plus or minus Jardiance.  (On retrospect, after realizing his multiple medication intolerances, indication for. Zio patch monitor ordered  Recent Hospitalizations:  11/28/2022: Nephrolithiasis-shockwave lithotripsy  Reviewed  CV studies:    The following studies were reviewed today: (if available, images/films reviewed: From Epic Chart or Care Everywhere) 14-day Zio patch April 2024: Predominantly A-sensed, V paced with heart rate range of 56 to 114 bpm, and average of 66 bpm.  Frequent PVCs (14.7%) and occasional Couplets (3.5)/triplets (1.1%) with ventricular bigeminy and trigeminy noted.  1 run of 4 ?  PVC beats with a rate of 140 bpm, 22 episodes of atrial tachycardia ranging from 85 to 140 bpm.  Fastest and longest episode lasted 1:02 minutes with a max rate of 140 bpm.  Interval History:   [name] returns today for routine follow-up stating he is doing fairly well.  He seems to have tolerated the spironolactone that was started by Dr. Graciela Husbands.  He had COVID recently.  Did not feel well.  Felt quite poorly and was not eating and drinking well.  During that timeframe he noted increased PVCs and he thinks that may be but what drove the PVCs up during his last monitor.   However, he says over the last couple weeks he started to feel a whole lot better.  His PVCs are notably less prominent.  He actually has  had more energy.  He remains busy around the house doing all the routine chores, cooking, cleaning, laundry.  He basically is now a main caregiver for his wife who is becoming less and less mobile.  No chest pain or pressure with rest or exertion.  No PND orthopnea with trivial edema-rarely uses Lasix maybe once a month.  Other than the palpitations from PVCs he denies any rapid irregular heartbeats or skipped beats.  No prolonged arrhythmias..  Over that he is feeling healthier, his energy level is improved.  No syncope or near syncope, no TIA or's although he did feel quite dizzy and weak when he had COVID.   REVIEWED OF SYSTEMS   Not as much energy, but not malaise or fatigue.-Much better since getting over the COVID milieu Occasionally uses a cane because of poor balance if he is walking longer distances.  Arthritis pains Prostate related urinary frequency  I have reviewed and (if needed) personally updated the patient's problem list, medications, allergies, past medical and surgical history, social and family history.   PAST MEDICAL HISTORY   Past Medical History:  Diagnosis Date   Allergic rhinitis    Arthritis    "entire body" (02/19/2015)   Asthma    Asthmatic bronchitis    Biventricular cardiac pacemaker in situ 02/10/2020   [facility] Jude Quadra Allure (Dr. 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   Frequent PVCs & fatigue -> MYOVIEW w/ inferior /r MV distribution --> CATH: only RIMA-LAD patent (LIMA atretic & SVGs occluded) --> Staged PCI of Native RCA & Cx; followed by PCI-distal RCA   CAD S/P percutaneous coronary angioplasty 8/'12; 06/'16   a) PCI- Cx-OM W/ 2 overlapping Resolute DES 3x15 & 3x18 stents (3.29mm),  PCI-RCA -  Promus DES 3x38 (3.40mm);; b) 6/'16: PCI-dRCA-rPAV Promus DES 2.75X28 (3.0 mm) -- stents  patent in 01/2018;  01/2018 -stents remain patent   CAS (cerebral atherosclerosis)    CAROTID DOPPLER, 08/25/2011 - mildly abnormal   Chronic back pain    Dilated cardiomyopathy (HCC): EF ~30-35%, 01/05/2018   11/2019 - EF ~30%, global HK with Septal dyssynergy --> 6/6/'21 - s/p CRT-P (St. Jude)   Diverticulosis    Erectile dysfunction    Essential hypertension    Medication intolerant -- only able to take minimal doses of medications   Family hx of colon cancer    GERD (gastroesophageal reflux disease)    Gout    Hemorrhoids    Hyperlipidemia with target LDL less than 70    Only able to tolerate very low dose statin - no interest in other options   Kidney stones    Myocardial infarction (HCC) 2005   Polycythemia    Presence of permanent cardiac pacemaker    S/P CABG x 4 07/2004   Cath for Fatigue & PVCs ==> MV CAD ==> CABG: pRIMA-LAD, pLIMA-LCx, SVG-D1, SVG-rPDA --> ONLY pRIMA-LAD patent   Symptomatic PVCs    As the main symptom for angina   Syncope 2010   ? unclear of etiology    PAST SURGICAL HISTORY   Past Surgical History:  Procedure Laterality Date   BIV PACEMAKER INSERTION CRT-P N/A 02/10/2020   Procedure: BIV PACEMAKER INSERTION CRT-P;  Surgeon: Regan Lemming, MD;  Location: [facility];  Service: Cardiovascular;  Laterality: N/A;   CARDIAC CATHETERIZATION  07/15/2004   CABG recommended; continue medical therapy   CARDIAC CATHETERIZATION  04/26/2011   Patent RIMA-LAD, atretic LIMA-Dx (previous) 100 and  CTO SVG to Cx and SVG to PDA. LAD 80-90% eccentric stenosis at SP1 that has 70% stenosis. Cx: Large OM and another distal branch with 90-95% stenosis followed by lesion in the OM branch. RCA dominant 60-80% lesion proximally w/ several old lesions. PDA occluded 70-80% annular lesion in the proximal RCA. 3 of 4 grafts CTO RIMA-LAD patent   CARDIAC CATHETERIZATION N/A 02/20/2015   Procedure: Left Heart Cath and Coronary Angiography;  Surgeon: Marykay Lex, MD;   Location: [facility]: Widely patent p-mRCA DES with dRCA 50%-PAVG 80% --> PCI. Patent overlapping stents --mCx-OM. 99% pLAD - patent RIMA-dLAD.  Known CTO SVG-rPL, SVG-OM3, :LIMA-D1.   CATARACT EXTRACTION W/ INTRAOCULAR LENS  IMPLANT, BILATERAL Bilateral ~ 2006/2007   CORONARY ANGIOPLASTY WITH STENT PLACEMENT  04/28/2011; 02/20/2015   PCI-Cx-OM 3 overlapping Resolute DES 3 mm x24mm and 3x59mm --> 3.31mm; PCI to RCA - Promus Element DES 3x46mm --> 3.32mm;; b) dRCA-rPAV: Promus Premier DES 2.27mm X 28 mm (3.0 mm)    CORONARY ARTERY BYPASS GRAFT  07/23/2004   LIMA-Cx RIMA-LAD, SVG-diagonal, SVG-PDA   EXTRACORPOREAL SHOCK WAVE LITHOTRIPSY Right 11/28/2022   Procedure: EXTRACORPOREAL SHOCK WAVE LITHOTRIPSY (ESWL) GAITED;  Surgeon: Heloise Purpura, MD;  Location: [facility] ;  Service: Urology;  Laterality: Right;  90 MINS   RIGHT/LEFT HEART CATH AND CORONARY/GRAFT ANGIOGRAPHY N/A 01/09/2018   Procedure: RIGHT/LEFT HEART CATH AND CORONARY/GRAFT ANGIOGRAPHY;  Surgeon: Marykay Lex, MD;; Known occluded LIMA-Diag & SVG-RCA, SVG-OM.  Mod-severe pLAD that is grafted after D1 - patent RIMA- wraparound LAD, distal 1/2 of PDA territory. Patent Cx-OM DES (ost Cx mild) & p-mRCA overlapping DES & dRCA-RPAV DES.  PCWP 12 mmHg, LVP-EDP: 140/6 - 14 mmH. RAP 2 mmHg. RVP-EDP 33/0 - 5  mmHg   TRANSTHORACIC ECHOCARDIOGRAM  02/18/2015    Mild concentric LVH, EF 55-60%, no RWMA, Gr 1 DD, aortic sclerosis without stenosis   TRANSTHORACIC ECHOCARDIOGRAM  12/2017; 06/2019   (New LBBB)  EF~35% with diffuse HK of septal & lateral walls.  Mildly dilated LV.  "GR 1 "DD with high filling pressures. ? w/ normal LA size; 10/20: EF 30-35%, LBBB related Septal dyskinesis, GR2 DD (but normal LA size).    TRANSTHORACIC ECHOCARDIOGRAM  11/27/2019   (On optimal/max tolerated dose of carvedilol and Entresto): EF estimated 30% with moderate to severe decreased function and global hypokinesis.  Significant septal-lateral  dyssynchrony from LBBB.  Mild LV dilation.  At least GR 1 DD.  Mildly reduced RV function but normal pressures.  Mild aortic sclerosis but no significant regurgitation.   TRANSTHORACIC ECHOCARDIOGRAM  09/14/2020   (s/p CRT-P) EF estimated 30%. Moderately decreased function with global HK. GR 1 DD. Mildly reduced RV function. Mild aortic valve thickening/sclerosis but no stenosis. (Compared to prior study, no notable change in fxn, but decreased LV r dilation with slight decrease in RV function).   TRANSURETHRAL RESECTION OF PROSTATE  ~ 2002/2003   RLCP 01/2018:  LIMA-Diag, SVG-RCA, SVG-OM known CTO.  Patent RIMA-LAD (severe native LAD disease prior to D1 & D2 both with extensive disease) - retrograde fills to CTO &antegrade wrap-around LAD (distal 1/2 of PDA).  DES in LCx-OM & p-m RCA & dRCA-RPAV widely patent with patent RPDA.  Normal RHC #s: PCWP / LVEDP ~12-14 mmHg. RAP & RVEDP ~0-2 mmHg.   MEDICATIONS/ALLERGIES   Current Meds  Medication Sig   ALPRAZolam (XANAX) 0.5 MG tablet Take 0.5 mg by mouth 2 (two) times daily as needed for anxiety (tremors).  aspirin EC 81 MG tablet Take 81 mg by mouth daily.    bisoprolol-hydrochlorothiazide (ZIAC) 2.5-6.25 MG tablet Take 1 tablet by mouth every morning.   chlorzoxazone (PARAFON) 500 MG tablet Take 500 mg by mouth daily as needed for muscle spasms.   ezetimibe-simvastatin (VYTORIN) 10-10 MG tablet Take 1 tablet by mouth at bedtime.   furosemide (LASIX) 20 MG tablet Take 1 tablet (20 mg total) by mouth as needed for edema.   HYDROcodone-acetaminophen (NORCO/VICODIN) 5-325 MG tablet Take 1 tablet by mouth every 6 (six) hours as needed for severe pain.   ondansetron (ZOFRAN) 4 MG tablet Take 1 tablet (4 mg total) by mouth every 8 (eight) hours as needed for nausea or vomiting.   Polyethyl Glycol-Propyl Glycol (SYSTANE OP) Place 1 drop into both eyes 2 (two) times daily.   spironolactone (ALDACTONE) 25 MG tablet Take 0.5 tablets (12.5 mg total) by  mouth daily.   SYMBICORT 160-4.5 MCG/ACT inhaler Inhale 2 puffs into the lungs in the morning and at bedtime.   tamsulosin (FLOMAX) 0.4 MG CAPS capsule Take 1 capsule (0.4 mg total) by mouth daily after supper.   []  bisoprolol (ZEBETA) 5 MG tablet TAKE 1 TABLET BY MOUTH IN THE MORNING    Allergies  Allergen Reactions   Meperidine Hcl     Cold sweats, dizziness    Penicillins     Severe headaches Has patient had a PCN reaction causing immediate rash, facial/tongue/throat swelling, SOB or lightheadedness with hypotension: No Has patient had a PCN reaction causing severe rash involving mucus membranes or skin necrosis: No Has patient had a PCN reaction that required hospitalization: No Has patient had a PCN reaction occurring within the last 10 years: No If all of the above answers are "NO", then may proceed with Cephalosporin use.     SOCIAL HISTORY/FAMILY HISTORY   Reviewed in Epic:  Pertinent findings:  Social History   Tobacco Use   Smoking status: Former    Packs/day: 1.00    Years: 7.00    Additional pack years: 0.00    Total pack years: 7.00    Types: Cigarettes    Quit date: 09/06/1963    Years since quitting: 59.4   Smokeless tobacco: Never  Vaping Use   Vaping Use: Never used  Substance Use Topics   Alcohol use: No   Drug use: No   Social History   Social History Narrative   He is made to patient of Dr. Herbie Baltimore.    He is a former smoker but quit in 1965 after 7 years.    He does not drink alcohol.      He previously worked for [employer], as a Warden/ranger.     He did some part-time work and then formally retired.  Is now the primary caregiver for his wife who has limited mobility.      He is not exercising regularly, but is quite active around the house doing all the house chores.  Tries to walk some, but now limited by somewhat unsteady gait, uses cane for long distances.      He has multiple drug intolerances, not necessarily allergies, but just  does not tolerate being on medications.  Has tried multiple different statins, as well as Zetia but has not been able to tolerate any.  Does not tolerate adjustments of blood pressure medications.  He titrates his medications at home until he can tolerate them and then will continue that dose plus or minus skipping some days.  OBJCTIVE -PE, EKG, labs   Wt Readings from Last 3 Encounters:  01/24/23 177 lb (80.3 kg)  11/28/22 177 lb (80.3 kg)  11/23/22 179 lb (81.2 kg)    Physical Exam: BP 122/62 (BP Location: Left Arm, Patient Position: Sitting, Cuff Size: Normal)   Pulse 66   Ht 5\' 8"  (1.727 m)   Wt 177 lb (80.3 kg)   BMI 26.91 kg/m  Physical Exam Vitals reviewed.  Constitutional:      General: He is not in acute distress.    Appearance: He is well-developed and normal weight. He is not ill-appearing or toxic-appearing.     Comments: Looks well stated age.  Actually looks healthier than last I saw him  HENT:     Head: Normocephalic and atraumatic.  Neck:     Vascular: No hepatojugular reflux or JVD.  Cardiovascular:     Rate and Rhythm: Normal rate and regular rhythm. Occasional Extrasystoles are present.    Chest Wall: PMI is not displaced.     Pulses: Normal pulses and intact distal pulses.     Heart sounds: S1 normal and S2 normal. Heart sounds are distant. Murmur heard.     Harsh crescendo-decrescendo early systolic murmur is present with a grade of 1/6 at the upper right sternal border radiating to the neck.     No friction rub. Gallop present. S4 sounds present.  Pulmonary:     Effort: Pulmonary effort is normal.     Breath sounds: Normal breath sounds. No decreased breath sounds, wheezing, rhonchi or rales.  Musculoskeletal:        General: No swelling. Normal range of motion.     Cervical back: Normal range of motion and neck supple.  Skin:    General: Skin is warm and dry.  Neurological:     General: No focal deficit present.     Mental Status: He is alert  and oriented to person, place, and time.     Gait: Gait abnormal.  Psychiatric:        Mood and Affect: Mood normal.        Behavior: Behavior normal.        Thought Content: Thought content normal.        Judgment: Judgment normal.     Adult ECG Report  Rate: 66 ;  Rhythm:  A-paced, V paced with PVC.   ;   Narrative Interpretation: stable  Recent Labs:   08/02/2022: TC 235, HDL 44, LDL 171, TG 109; A1c 6.3 Lab Results  Component Value Date   CHOL 173 01/05/2018   HDL 50 01/05/2018   LDLCALC 94 01/05/2018   TRIG 144 01/05/2018   CHOLHDL 3.5 01/05/2018   Lab Results  Component Value Date   CREATININE 1.17 12/09/2022   BUN 21 12/09/2022   NA 143 12/09/2022   K 4.6 12/09/2022   CL 102 12/09/2022   CO2 25 12/09/2022      Latest Ref Rng & Units 10/22/2022    9:14 AM 01/30/2020    8:34 AM 01/05/2018    2:15 PM  CBC  WBC 4.0 - 10.5 K/uL 19.8  9.7  9.8   Hemoglobin 13.0 - 17.0 g/dL 16.1  09.6  04.5   Hematocrit 39.0 - 52.0 % 46.7  45.4  48.3   Platelets 150 - 400 K/uL 246  269  242     No results found for: "HGBA1C" No results found for: "TSH"  ================================================== I spent a total of 38 minutes with  the patient spent in direct patient consultation.  Additional time spent with chart review  / charting (studies, outside notes, etc): 18 min Total Time: 56 min  Current medicines are reviewed at length with the patient today.  (+/- concerns) none  Notice: This dictation was prepared with Dragon dictation along with smart phrase technology. Any transcriptional errors that result from this process are unintentional and may not be corrected upon review.  Studies Ordered:   Orders Placed This Encounter  Procedures   EKG 12-Lead   Meds ordered this encounter  Medications   bisoprolol-hydrochlorothiazide (ZIAC) 2.5-6.25 MG tablet    Sig: Take 1 tablet by mouth every morning.    Dispense:  90 tablet    Refill:  3    This in addition to  bisoprolol 5 mg in the evening   bisoprolol (ZEBETA) 5 MG tablet    Sig: Take 1 tablet (5 mg total) by mouth every evening.    Dispense:  90 tablet    Refill:  3    Patient Instructions / Medication Changes & Studies & Tests Ordered   Patient Instructions  Medication Instructions:  Your physician recommends that you continue on your current medications as directed. Please refer to the Current Medication list given to you today. *If you need a refill on your cardiac medications before your next appointment, please call your pharmacy*   Lab Work: None    Testing/Procedures: None   Follow-Up: At [facility], you and your health needs are our priority.  As part of our continuing mission to provide you with exceptional heart care, we have created designated Provider Care Teams.  These Care Teams include your primary Cardiologist (physician) and Advanced Practice Providers (APPs -  Physician Assistants and Nurse Practitioners) who all work together to provide you with the care you need, when you need it.    Your next appointment:    6 month(s)  Provider:   Bryan Lemma, MD      Marykay Lex, MD, MS Bryan Lemma, M.D., M.S. Interventional Cardiologist  [facility]  Pager # [phone] Phone # [phone] [address] [place], [place] [address]   Thank you for choosing [facility] [place]!!

## 2023-01-24 NOTE — Patient Instructions (Addendum)
Medication Instructions:  Your physician recommends that you continue on your current medications as directed. Please refer to the Current Medication list given to you today. *If you need a refill on your cardiac medications before your next appointment, please call your pharmacy*   Lab Work: None    Testing/Procedures: None   Follow-Up: At [facility], you and your health needs are our priority.  As part of our continuing mission to provide you with exceptional heart care, we have created designated Provider Care Teams.  These Care Teams include your primary Cardiologist (physician) and Advanced Practice Providers (APPs -  Physician Assistants and Nurse Practitioners) who all work together to provide you with the care you need, when you need it.    Your next appointment:    6 month(s)  Provider:   Bryan Lemma, MD

## 2023-01-29 ENCOUNTER — Encounter: Payer: Self-pay | Admitting: Cardiology

## 2023-01-29 NOTE — Assessment & Plan Note (Signed)
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  He is on pretty much max tolerated dose of beta-blocker.  He thinks that his symptoms are much improved and he is not really feeling palpitations now.  At this point I am reluctant to do a thing else.  He is due to follow-up with Dr. Elberta Fortis so I will send this note to him as well.  Not sure how he would tolerate antiarrhythmics, but I am a little bit concerned with the PVC burden.  1 consideration will be to reassess a monitor in a few months to see if the PVCs have indeed calm down with his recovery from COVID and kidney stones.

## 2023-01-29 NOTE — Assessment & Plan Note (Signed)
NYHA class IIa symptoms.  He really does note some exertional dyspnea but is very deconditioned.  He stopped being very active when he retired and has gotten more deconditioned with unstable gait.  No edema no PND orthopnea.  Euvolemic on exam.  Rarely uses PRN Lasix.  He seems to be doing okay on the spironolactone which so we will continue it along with the morning dose of bisoprolol-HCTZ.  Continue the nighttime dose of bisoprolol. We had previously tried to get him on Entresto and he did not even tolerate taking half of the low-dose tablet once a day.  Similarly, he was unable to tolerate ARB's or ACE inhibitor's in the past.  Would not rechallenge.  As long as he remains stable we will continue to leave medication regimen alone.

## 2023-01-29 NOTE — Assessment & Plan Note (Signed)
Completely intolerant of atorvastatin, rosuvastatin, simvastatin, pravastatin.  But started on Vytorin by PCP but did not tolerate.  Also did not tolerate Zetia  He is not interested in trying any new medications.

## 2023-01-29 NOTE — Assessment & Plan Note (Signed)
This is a known untreated cardiac risk factor.  For many years we have discussed management options and he is reluctant to try anything new.  He understands the risk of not treating his lipids but not willing to sacrifice lifestyle/quality of life while trying new medications.  Does not want the cost of new medications such as Praluent or Repatha.[name]

## 2023-01-29 NOTE — Assessment & Plan Note (Signed)
In the past he has had orthostatic hypotension with significant drops in blood pressure WHICH IS THE REASON WHY WE HAVE NOT USED AGGRESSIVE BLOOD PRESSURE MANAGEMENT WITH ARB/ACE INHIBITOR OR ARNI

## 2023-01-29 NOTE — Assessment & Plan Note (Signed)
RIMA to LAD patent other grafts closed.  LCx, RCA and RPA V have always stented.  No longer having angina.  PVCs are concerned, but they seem to be improved. Essentially he really is only on the aspirin and beta-blocker.  Was able to tolerate slightly increased dose of beta-blocker continue total of 7.5 mg bisoprolol daily with a 2.5 mg the morning and 5 in the evening.

## 2023-01-29 NOTE — Assessment & Plan Note (Signed)
EF did not improve after CRT-P but symptoms did improve.  Euvolemic on exam.   GDMT management very limited by his intolerance of medications.  He is currently now on beta-blocker with little dose of HCTZ as well as spironolactone.  In the past did not tolerate ARB/ACE inhibitor/ARNI's. Could consider Jardiance or Marcelline Deist, but with his lack of diuretic requirement, not sure how much benefit he would get.  He is reluctant to try new medications.

## 2023-01-29 NOTE — Assessment & Plan Note (Signed)
Much better controlled.  He is rarely using Lasix now.

## 2023-01-29 NOTE — Assessment & Plan Note (Signed)
Thought to be one of the major reasons for his cardiomyopathy.  Now has BiV CRT-P placement.

## 2023-01-29 NOTE — Assessment & Plan Note (Signed)
No recurrent anginal symptoms.  At this point the plan will be to hold off on any further ischemic evaluations unless he does have worsening symptoms.  Plan:  With PVC burden, I did try to increase up to 5 mg twice daily bisoprolol, he is not quite able to do it.  He takes the bisoprolol-HCTZ 2.5 mg - 6.25 mg in the morning and takes the 5 mg bisoprolol at night.  This keeps the swelling down.-We will simply continue current regimen states he is feeling better now. Continue spironolactone. Continue aspirin. Not able to tolerate pravastatin in the past but now he has Vytorin listed.  There was a rechallenge by PCP he did not take more than 3 days.

## 2023-02-03 DIAGNOSIS — R7301 Impaired fasting glucose: Secondary | ICD-10-CM | POA: Diagnosis not present

## 2023-02-03 DIAGNOSIS — E782 Mixed hyperlipidemia: Secondary | ICD-10-CM | POA: Diagnosis not present

## 2023-02-04 LAB — LAB REPORT - SCANNED
A1c: 6.4
Albumin, Urine POC: 16
Albumin/Creatinine Ratio, Urine, POC: 23
Creatinine, POC: 69.6 mg/dL
EGFR: 66

## 2023-02-08 ENCOUNTER — Ambulatory Visit (INDEPENDENT_AMBULATORY_CARE_PROVIDER_SITE_OTHER): Payer: Medicare Other

## 2023-02-08 DIAGNOSIS — I420 Dilated cardiomyopathy: Secondary | ICD-10-CM

## 2023-02-08 LAB — CUP PACEART REMOTE DEVICE CHECK
Battery Remaining Longevity: 57 mo
Battery Remaining Percentage: 61 %
Battery Voltage: 2.99 V
Brady Statistic AP VP Percent: 84 %
Brady Statistic AP VS Percent: 1.2 %
Brady Statistic AS VP Percent: 4.8 %
Brady Statistic AS VS Percent: 1 %
Brady Statistic RA Percent Paced: 76 %
Date Time Interrogation Session: 20240605020010
Implantable Lead Connection Status: 753985
Implantable Lead Connection Status: 753985
Implantable Lead Connection Status: 753985
Implantable Lead Implant Date: 20210607
Implantable Lead Implant Date: 20210607
Implantable Lead Implant Date: 20210607
Implantable Lead Location: 753858
Implantable Lead Location: 753859
Implantable Lead Location: 753860
Implantable Pulse Generator Implant Date: 20210607
Lead Channel Impedance Value: 450 Ohm
Lead Channel Impedance Value: 450 Ohm
Lead Channel Impedance Value: 830 Ohm
Lead Channel Pacing Threshold Amplitude: 0.75 V
Lead Channel Pacing Threshold Amplitude: 0.75 V
Lead Channel Pacing Threshold Amplitude: 1 V
Lead Channel Pacing Threshold Pulse Width: 0.5 ms
Lead Channel Pacing Threshold Pulse Width: 0.5 ms
Lead Channel Pacing Threshold Pulse Width: 0.5 ms
Lead Channel Sensing Intrinsic Amplitude: 5 mV
Lead Channel Sensing Intrinsic Amplitude: 8.5 mV
Lead Channel Setting Pacing Amplitude: 2 V
Lead Channel Setting Pacing Amplitude: 2 V
Lead Channel Setting Pacing Amplitude: 2 V
Lead Channel Setting Pacing Pulse Width: 0.5 ms
Lead Channel Setting Pacing Pulse Width: 0.5 ms
Lead Channel Setting Sensing Sensitivity: 2 mV
Pulse Gen Model: 3562
Pulse Gen Serial Number: 3818273

## 2023-02-09 DIAGNOSIS — E291 Testicular hypofunction: Secondary | ICD-10-CM | POA: Diagnosis not present

## 2023-02-09 DIAGNOSIS — G8929 Other chronic pain: Secondary | ICD-10-CM | POA: Diagnosis not present

## 2023-02-09 DIAGNOSIS — N200 Calculus of kidney: Secondary | ICD-10-CM | POA: Diagnosis not present

## 2023-02-09 DIAGNOSIS — R7303 Prediabetes: Secondary | ICD-10-CM | POA: Diagnosis not present

## 2023-02-09 DIAGNOSIS — I110 Hypertensive heart disease with heart failure: Secondary | ICD-10-CM | POA: Diagnosis not present

## 2023-02-09 DIAGNOSIS — M545 Low back pain, unspecified: Secondary | ICD-10-CM | POA: Diagnosis not present

## 2023-02-09 DIAGNOSIS — R978 Other abnormal tumor markers: Secondary | ICD-10-CM | POA: Diagnosis not present

## 2023-02-09 DIAGNOSIS — J4540 Moderate persistent asthma, uncomplicated: Secondary | ICD-10-CM | POA: Diagnosis not present

## 2023-02-09 DIAGNOSIS — E782 Mixed hyperlipidemia: Secondary | ICD-10-CM | POA: Diagnosis not present

## 2023-02-09 DIAGNOSIS — I10 Essential (primary) hypertension: Secondary | ICD-10-CM | POA: Diagnosis not present

## 2023-02-09 DIAGNOSIS — I5042 Chronic combined systolic (congestive) and diastolic (congestive) heart failure: Secondary | ICD-10-CM | POA: Diagnosis not present

## 2023-02-09 DIAGNOSIS — I420 Dilated cardiomyopathy: Secondary | ICD-10-CM | POA: Diagnosis not present

## 2023-02-10 NOTE — Progress Notes (Signed)
02/04/2023 - Labs from Dr. Margo Aye - PCP.  Na+ 141, K+ 5.0, Cl- 103, HCO3-27, BUN 18, Cr 1.11, Glu 102, Ca2+ 9.2; AST 19, ALT 13, AlkP 81 => relatively normal.  Potassium is borderline elevated but stable. CBC: W 8.6, H/H 13.8/42, Plt 247; A1c 6.4 (borderline for Diabetes - cutoff is 6.5) TC 267, TG 96, HDL 42, LDL 208;  => well out of control - per multiple discussions - not interested in taking medications to treat. ( Vytorin listed on Med list)  Bryan Lemma, MD

## 2023-02-13 ENCOUNTER — Encounter: Payer: Self-pay | Admitting: Cardiology

## 2023-02-13 ENCOUNTER — Ambulatory Visit: Payer: Medicare Other | Attending: Cardiology | Admitting: Cardiology

## 2023-02-13 VITALS — BP 128/62 | HR 64 | Ht 68.0 in | Wt 180.0 lb

## 2023-02-13 DIAGNOSIS — I10 Essential (primary) hypertension: Secondary | ICD-10-CM | POA: Diagnosis not present

## 2023-02-13 DIAGNOSIS — I2510 Atherosclerotic heart disease of native coronary artery without angina pectoris: Secondary | ICD-10-CM

## 2023-02-13 DIAGNOSIS — I5022 Chronic systolic (congestive) heart failure: Secondary | ICD-10-CM | POA: Diagnosis not present

## 2023-02-13 DIAGNOSIS — I493 Ventricular premature depolarization: Secondary | ICD-10-CM

## 2023-02-13 LAB — CUP PACEART INCLINIC DEVICE CHECK
Battery Remaining Longevity: 56 mo
Battery Voltage: 2.99 V
Brady Statistic RA Percent Paced: 84 %
Brady Statistic RV Percent Paced: 88 %
Date Time Interrogation Session: 20240610162534
Implantable Lead Connection Status: 753985
Implantable Lead Connection Status: 753985
Implantable Lead Connection Status: 753985
Implantable Lead Implant Date: 20210607
Implantable Lead Implant Date: 20210607
Implantable Lead Implant Date: 20210607
Implantable Lead Location: 753858
Implantable Lead Location: 753859
Implantable Lead Location: 753860
Implantable Pulse Generator Implant Date: 20210607
Lead Channel Pacing Threshold Amplitude: 0.75 V
Lead Channel Pacing Threshold Amplitude: 0.875 V
Lead Channel Pacing Threshold Pulse Width: 0.5 ms
Lead Channel Pacing Threshold Pulse Width: 0.5 ms
Lead Channel Sensing Intrinsic Amplitude: 5 mV
Lead Channel Sensing Intrinsic Amplitude: 8.1 mV
Lead Channel Setting Pacing Amplitude: 2 V
Lead Channel Setting Pacing Amplitude: 2 V
Lead Channel Setting Pacing Amplitude: 2 V
Lead Channel Setting Pacing Pulse Width: 0.5 ms
Lead Channel Setting Pacing Pulse Width: 0.5 ms
Lead Channel Setting Sensing Sensitivity: 2 mV
Pulse Gen Model: 3562
Pulse Gen Serial Number: 3818273

## 2023-02-13 NOTE — Progress Notes (Signed)
Electrophysiology Office Note   Date:  02/13/2023   ID:  [name] Sep 18, 1940, MRN [id number]  PCP:  [name] Stabile, MD  Cardiologist:  [name] Primary Electrophysiologist:  [name] Bogosian [name], MD    Chief Complaint: CHF   History of Present Illness: [name] is a 82 y.o. male who is being seen today for the evaluation of CHF at the request of [name] Stabile, MD. Presenting today for electrophysiology evaluation.  He has a history of coronary artery disease post CABG, PVCs, CHF, hypertension, hyperlipidemia.  A left bundle branch block.  He is post [employer] CRT-P implanted 02/10/2020.  Today, denies symptoms of palpitations, chest pain, shortness of breath, orthopnea, PND, lower extremity edema, claudication, dizziness, presyncope, syncope, bleeding, or neurologic sequela. The patient is tolerating medications without difficulties.    Past Medical History:  Diagnosis Date   Allergic rhinitis    Arthritis    "entire body" (02/19/2015)   Asthma    Asthmatic bronchitis    Biventricular cardiac pacemaker in situ 02/10/2020   [facility] Jude Quadra Allure (Dr. 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   Frequent PVCs & fatigue -> MYOVIEW w/ inferior /r MV distribution --> CATH: only RIMA-LAD patent (LIMA atretic & SVGs occluded) --> Staged PCI of Native RCA & Cx; followed by PCI-distal RCA   CAD S/P percutaneous coronary angioplasty 8/'12; 06/'16   a) PCI- Cx-OM W/ 2 overlapping Resolute DES 3x15 & 3x18 stents (3.48mm),  PCI-RCA -  Promus DES 3x38 (3.52mm);; b) 6/'16: PCI-dRCA-rPAV Promus DES 2.75X28 (3.0 mm) -- stents patent in 01/2018;  01/2018 -stents remain patent   CAS (cerebral atherosclerosis)    CAROTID DOPPLER, 08/25/2011 - mildly abnormal   Chronic back pain    Dilated cardiomyopathy (HCC): EF ~30-35%, 01/05/2018   11/2019 - EF ~30%, global HK with Septal dyssynergy --> 6/6/'21 - s/p CRT-P (St. Jude)   Diverticulosis    Erectile dysfunction     Essential hypertension    Medication intolerant -- only able to take minimal doses of medications   Family hx of colon cancer    GERD (gastroesophageal reflux disease)    Gout    Hemorrhoids    Hyperlipidemia with target LDL less than 70    Only able to tolerate very low dose statin - no interest in other options   Kidney stones    Myocardial infarction (HCC) 2005   Polycythemia    Presence of permanent cardiac pacemaker    S/P CABG x 4 07/2004   Cath for Fatigue & PVCs ==> MV CAD ==> CABG: pRIMA-LAD, pLIMA-LCx, SVG-D1, SVG-rPDA --> ONLY pRIMA-LAD patent   Symptomatic PVCs    As the main symptom for angina   Syncope 2010   ? unclear of etiology   Past Surgical History:  Procedure Laterality Date   BIV PACEMAKER INSERTION CRT-P N/A 02/10/2020   Procedure: BIV PACEMAKER INSERTION CRT-P;  Surgeon: Regan Lemming, MD;  Location: [facility] INVASIVE CV [facility];  Service: Cardiovascular;  Laterality: N/A;   CARDIAC CATHETERIZATION  07/15/2004   CABG recommended; continue medical therapy   CARDIAC CATHETERIZATION  04/26/2011   Patent RIMA-LAD, atretic LIMA-Dx (previous) 100 and CTO SVG to Cx and SVG to PDA. LAD 80-90% eccentric stenosis at SP1 that has 70% stenosis. Cx: Large OM and another distal branch with 90-95% stenosis followed by lesion in the OM branch. RCA dominant 60-80% lesion proximally w/ several old lesions. PDA occluded 70-80% annular  lesion in the proximal RCA. 3 of 4 grafts CTO RIMA-LAD patent   CARDIAC CATHETERIZATION N/A 02/20/2015   Procedure: Left Heart Cath and Coronary Angiography;  Surgeon: Marykay Lex, MD;  Location: [facility] INVASIVE CV LAB: Widely patent p-mRCA DES with dRCA 50%-PAVG 80% --> PCI. Patent overlapping stents --mCx-OM. 99% pLAD - patent RIMA-dLAD.  Known CTO SVG-rPL, SVG-OM3, :LIMA-D1.   CATARACT EXTRACTION W/ INTRAOCULAR LENS  IMPLANT, BILATERAL Bilateral ~ 2006/2007   CORONARY ANGIOPLASTY WITH STENT PLACEMENT  04/28/2011; 02/20/2015   PCI-Cx-OM 3 overlapping  Resolute DES 3 mm x1mm and 3x59mm --> 3.48mm; PCI to RCA - Promus Element DES 3x45mm --> 3.30mm;; b) dRCA-rPAV: Promus Premier DES 2.40mm X 28 mm (3.0 mm)    CORONARY ARTERY BYPASS GRAFT  07/23/2004   LIMA-Cx RIMA-LAD, SVG-diagonal, SVG-PDA   EXTRACORPOREAL SHOCK WAVE LITHOTRIPSY Right 11/28/2022   Procedure: EXTRACORPOREAL SHOCK WAVE LITHOTRIPSY (ESWL) GAITED;  Surgeon: Heloise Purpura, MD;  Location: [facility];  Service: Urology;  Laterality: Right;  90 MINS   RIGHT/LEFT HEART CATH AND CORONARY/GRAFT ANGIOGRAPHY N/A 01/09/2018   Procedure: RIGHT/LEFT HEART CATH AND CORONARY/GRAFT ANGIOGRAPHY;  Surgeon: Marykay Lex, MD;; Known occluded LIMA-Diag & SVG-RCA, SVG-OM.  Mod-severe pLAD that is grafted after D1 - patent RIMA- wraparound LAD, distal 1/2 of PDA territory. Patent Cx-OM DES (ost Cx mild) & p-mRCA overlapping DES & dRCA-RPAV DES.  PCWP 12 mmHg, LVP-EDP: 140/6 - 14 mmH. RAP 2 mmHg. RVP-EDP 33/0 - 5  mmHg   TRANSTHORACIC ECHOCARDIOGRAM  02/18/2015    Mild concentric LVH, EF 55-60%, no RWMA, Gr 1 DD, aortic sclerosis without stenosis   TRANSTHORACIC ECHOCARDIOGRAM  12/2017; 06/2019   (New LBBB)  EF~35% with diffuse HK of septal & lateral walls.  Mildly dilated LV.  "GR 1 "DD with high filling pressures. ? w/ normal LA size; 10/20: EF 30-35%, LBBB related Septal dyskinesis, GR2 DD (but normal LA size).    TRANSTHORACIC ECHOCARDIOGRAM  11/27/2019   (On optimal/max tolerated dose of carvedilol and Entresto): EF estimated 30% with moderate to severe decreased function and global hypokinesis.  Significant septal-lateral dyssynchrony from LBBB.  Mild LV dilation.  At least GR 1 DD.  Mildly reduced RV function but normal pressures.  Mild aortic sclerosis but no significant regurgitation.   TRANSTHORACIC ECHOCARDIOGRAM  09/14/2020   (s/p CRT-P) EF estimated 30%. Moderately decreased function with global HK. GR 1 DD. Mildly reduced RV function. Mild aortic valve thickening/sclerosis but no  stenosis. (Compared to prior study, no notable change in fxn, but decreased LV r dilation with slight decrease in RV function).   TRANSURETHRAL RESECTION OF PROSTATE  ~ 2002/2003     Current Outpatient Medications  Medication Sig Dispense Refill   ALPRAZolam (XANAX) 0.5 MG tablet Take 0.5 mg by mouth 2 (two) times daily as needed for anxiety (tremors).     aspirin EC 81 MG tablet Take 81 mg by mouth daily.      bisoprolol (ZEBETA) 5 MG tablet Take 1 tablet (5 mg total) by mouth every evening. 90 tablet 3   bisoprolol-hydrochlorothiazide (ZIAC) 2.5-6.25 MG tablet Take 1 tablet by mouth every morning. 90 tablet 3   chlorzoxazone (PARAFON) 500 MG tablet Take 500 mg by mouth daily as needed for muscle spasms.     ezetimibe-simvastatin (VYTORIN) 10-10 MG tablet Take 1 tablet by mouth at bedtime.     furosemide (LASIX) 20 MG tablet Take 1 tablet (20 mg total) by mouth as needed for edema. 90 tablet 0  ondansetron (ZOFRAN) 4 MG tablet Take 1 tablet (4 mg total) by mouth every 8 (eight) hours as needed for nausea or vomiting. 30 tablet 0   Polyethyl Glycol-Propyl Glycol (SYSTANE OP) Place 1 drop into both eyes 2 (two) times daily.     spironolactone (ALDACTONE) 25 MG tablet Take 0.5 tablets (12.5 mg total) by mouth daily. 45 tablet 3   SYMBICORT 160-4.5 MCG/ACT inhaler Inhale 2 puffs into the lungs in the morning and at bedtime.     HYDROcodone-acetaminophen (NORCO/VICODIN) 5-325 MG tablet Take 1 tablet by mouth every 6 (six) hours as needed for severe pain. 15 tablet 0   tamsulosin (FLOMAX) 0.4 MG CAPS capsule Take 1 capsule (0.4 mg total) by mouth daily after supper. 30 capsule 0   No current facility-administered medications for this visit.    Allergies:   Meperidine hcl and Penicillins   Social History:  The patient  reports that he quit smoking about 59 years ago. His smoking use included cigarettes. He has a 7.00 pack-year smoking history. He has never used smokeless tobacco. He reports that  he does not drink alcohol and does not use drugs.   Family History:  The patient's family history includes Colon cancer in his brother; Heart disease in his brother; Heart disease (age of onset: 67) in his father; Hypertension (age of onset: 70) in his brother; Hypertension (age of onset: 46) in his mother; Stomach cancer in his sister; Stroke (age of onset: 30) in his mother.   ROS:  Please see the history of present illness.   Otherwise, review of systems is positive for none.   All other systems are reviewed and negative.   PHYSICAL EXAM: VS:  BP 128/62   Pulse 64   Ht 5\' 8"  (1.727 m)   Wt 180 lb (81.6 kg)   SpO2 96%   BMI 27.37 kg/m  , BMI Body mass index is 27.37 kg/m. GEN: Well nourished, well developed, in no acute distress  HEENT: normal  Neck: no JVD, carotid bruits, or masses Cardiac: RRR; no murmurs, rubs, or gallops,no edema  Respiratory:  clear to auscultation bilaterally, normal work of breathing GI: soft, nontender, nondistended, + BS MS: no deformity or atrophy  Skin: warm and dry, device site well healed Neuro:  Strength and sensation are intact Psych: euthymic mood, full affect  EKG:  EKG is not ordered today. Personal review of the ekg ordered 01/24/23 shows AV paced, PVC  Personal review of the device interrogation today. Results in Paceart    Recent Labs: 10/22/2022: ALT 22; Hemoglobin 15.3; Platelets 246 12/09/2022: BUN 21; Creatinine, Ser 1.17; Potassium 4.6; Sodium 143    Lipid Panel     Component Value Date/Time   CHOL 173 01/05/2018 1415   TRIG 144 01/05/2018 1415   HDL 50 01/05/2018 1415   CHOLHDL 3.5 01/05/2018 1415   CHOLHDL 3.3 08/05/2014 0844   VLDL 19 08/05/2014 0844   LDLCALC 94 01/05/2018 1415     Wt Readings from Last 3 Encounters:  02/13/23 180 lb (81.6 kg)  01/24/23 177 lb (80.3 kg)  11/28/22 177 lb (80.3 kg)      Other studies Reviewed: Additional studies/ records that were reviewed today include: TTE 06/25/19  Review of the  above records today demonstrates:  1. Left ventricular ejection fraction, by visual estimation, is 30 to 35%. The left ventricle has moderate to severely decreased function. Normal left ventricular size. There is mildly increased left ventricular hypertrophy.  2. Abnormal septal motion  consistent with left bundle branch block.  3. Left ventricular diastolic Doppler parameters are consistent with pseudonormalization pattern of LV diastolic filling.  4. Elevated mean left atrial pressure.  5. Global right ventricle has normal systolic function.The right ventricular size is normal. No increase in right ventricular wall thickness.  6. Left atrial size was normal.  7. Right atrial size was normal.  8. The mitral valve is normal in structure. No evidence of mitral valve regurgitation.  9. The tricuspid valve is normal in structure. Tricuspid valve regurgitation is trivial. 10. The aortic valve is tricuspid Aortic valve regurgitation was not visualized by color flow Doppler. 11. The pulmonic valve was not well visualized. Pulmonic valve regurgitation is trivial by color flow Doppler. 12. The inferior vena cava is normal in size with greater than 50% respiratory variability, suggesting right atrial pressure of 3 mmHg.  LHC 01/09/18 Previously placed Ost RCA to Mid RCA stent (DES) is widely patent. Previously placed Dist RCA- Post Atrio stent (DES) is widely patent. Previously placed Prox Cx to Dist Cx stent (DES) is widely patent. Ost LAD lesion is 80% stenosed. Prox LAD lesion is 60% stenosed. Ost 2nd Diag lesion is 85% stenosed. _______________________________________________________________________________________________ RIMA graft was visualized by angiography and is large. The graft exhibits no disease. --It feels a wraparound LAD that perfuses the distal/apical half of the PDA. SVG-RPDA graft was not injected. Origin lesion is 100% stenosed. Known CTO SVG-OM graft was visualized by angiography.  Origin to Prox Graft lesion is 100% stenosed. Known CTO __________________________________________________________________________________________ LIMA-DIAG: (Not Injected) Origin to Prox Graft lesion is 100% stenosed. Known CTO There is mild to moderate left ventricular systolic dysfunction. The left ventricular ejection fraction is 35-45% by visual estimate. Normal right heart cath pressures with normal cardiac output/index. LV end diastolic pressure is normal.  ASSESSMENT AND PLAN:  1.  Coronary artery disease: Status post CABG and PCI.  Plan per primary cardiology.  2.  Chronic systolic heart failure: Ejection fraction 30 to 35%.  On carvedilol and Entresto.  Post Saint Jude CRT-P implanted 02/10/2020.  Device functioning appropriately.  3.  PVCs: Elevated burden.  Increased pacing rate to 75 bpm.  Has improved overall PVC burden.  [name] reassess at next remote.  4.  Hypertension: Currently well-controlled   Current medicines are reviewed at length with the patient today.   The patient does not have concerns regarding his medicines.  The following changes were made today: none  Labs/ tests ordered today include:  No orders of the defined types were placed in this encounter.   Disposition:   FU 12 months  Signed, Forrester Blando [name], MD  02/13/2023 3:36 PM     [facility] [address] [place] [address] [phone] (office) [phone] (fax)

## 2023-02-13 NOTE — Patient Instructions (Signed)
Medication Instructions:  Your physician recommends that you continue on your current medications as directed. Please refer to the Current Medication list given to you today.  *If you need a refill on your cardiac medications before your next appointment, please call your pharmacy*   Lab Work: None ordered    Testing/Procedures: None ordered   Follow-Up: At [facility] you and your health needs are our priority.  As part of our continuing mission to provide you with exceptional heart care, we have created designated Provider Care Teams.  These Care Teams include your primary Cardiologist (physician) and Advanced Practice Providers (APPs -  Physician Assistants and Nurse Practitioners) who all work together to provide you with the care you need, when you need it.  Remote monitoring is used to monitor your Pacemaker or ICD from home. This monitoring reduces the number of office visits required to check your device to one time per year. It allows [place] to keep an eye on the functioning of your device to ensure it is working properly. You are scheduled for a device check from home on 05/10/2023. You may send your transmission at any time that day. If you have a wireless device, the transmission will be sent automatically. After your physician reviews your transmission, you will receive a postcard with your next transmission date.  Your next appointment:   1 year(s)  The format for your next appointment:   In Person  Provider:   You will see one of the following Advanced Practice Providers on your designated Care Team:   Francis Dowse, [place] Casimiro Needle "Mardelle Matte" Lanna Poche, [place]  Thank you for choosing [facility]!!   Dory Horn, RN [phone]

## 2023-03-06 NOTE — Progress Notes (Signed)
Remote pacemaker transmission.   

## 2023-04-24 DIAGNOSIS — L814 Other melanin hyperpigmentation: Secondary | ICD-10-CM | POA: Diagnosis not present

## 2023-04-24 DIAGNOSIS — L820 Inflamed seborrheic keratosis: Secondary | ICD-10-CM | POA: Diagnosis not present

## 2023-04-24 DIAGNOSIS — L821 Other seborrheic keratosis: Secondary | ICD-10-CM | POA: Diagnosis not present

## 2023-04-24 DIAGNOSIS — L578 Other skin changes due to chronic exposure to nonionizing radiation: Secondary | ICD-10-CM | POA: Diagnosis not present

## 2023-04-24 DIAGNOSIS — L570 Actinic keratosis: Secondary | ICD-10-CM | POA: Diagnosis not present

## 2023-05-10 ENCOUNTER — Ambulatory Visit (INDEPENDENT_AMBULATORY_CARE_PROVIDER_SITE_OTHER): Payer: Medicare Other

## 2023-05-10 DIAGNOSIS — I5022 Chronic systolic (congestive) heart failure: Secondary | ICD-10-CM | POA: Diagnosis not present

## 2023-05-10 LAB — CUP PACEART REMOTE DEVICE CHECK
Battery Remaining Longevity: 46 mo
Battery Remaining Percentage: 57 %
Battery Voltage: 2.98 V
Brady Statistic AP VP Percent: 98 %
Brady Statistic AP VS Percent: 1 %
Brady Statistic AS VP Percent: 1 %
Brady Statistic AS VS Percent: 1 %
Brady Statistic RA Percent Paced: 98 %
Date Time Interrogation Session: 20240904020013
Implantable Lead Connection Status: 753985
Implantable Lead Connection Status: 753985
Implantable Lead Connection Status: 753985
Implantable Lead Implant Date: 20210607
Implantable Lead Implant Date: 20210607
Implantable Lead Implant Date: 20210607
Implantable Lead Location: 753858
Implantable Lead Location: 753859
Implantable Lead Location: 753860
Implantable Pulse Generator Implant Date: 20210607
Lead Channel Impedance Value: 410 Ohm
Lead Channel Impedance Value: 440 Ohm
Lead Channel Impedance Value: 810 Ohm
Lead Channel Pacing Threshold Amplitude: 0.75 V
Lead Channel Pacing Threshold Amplitude: 0.875 V
Lead Channel Pacing Threshold Amplitude: 1 V
Lead Channel Pacing Threshold Pulse Width: 0.5 ms
Lead Channel Pacing Threshold Pulse Width: 0.5 ms
Lead Channel Pacing Threshold Pulse Width: 0.5 ms
Lead Channel Sensing Intrinsic Amplitude: 5 mV
Lead Channel Sensing Intrinsic Amplitude: 9.4 mV
Lead Channel Setting Pacing Amplitude: 2 V
Lead Channel Setting Pacing Amplitude: 2 V
Lead Channel Setting Pacing Amplitude: 2 V
Lead Channel Setting Pacing Pulse Width: 0.5 ms
Lead Channel Setting Pacing Pulse Width: 0.5 ms
Lead Channel Setting Sensing Sensitivity: 2 mV
Pulse Gen Model: 3562
Pulse Gen Serial Number: 3818273

## 2023-05-23 NOTE — Progress Notes (Signed)
Remote pacemaker transmission.   

## 2023-05-28 ENCOUNTER — Encounter: Payer: Self-pay | Admitting: Cardiology

## 2023-07-05 ENCOUNTER — Encounter: Payer: Self-pay | Admitting: Cardiology

## 2023-07-05 DIAGNOSIS — J069 Acute upper respiratory infection, unspecified: Secondary | ICD-10-CM | POA: Diagnosis not present

## 2023-07-05 DIAGNOSIS — J4540 Moderate persistent asthma, uncomplicated: Secondary | ICD-10-CM | POA: Diagnosis not present

## 2023-07-10 ENCOUNTER — Other Ambulatory Visit: Payer: Medicare Other

## 2023-07-10 ENCOUNTER — Ambulatory Visit (HOSPITAL_COMMUNITY): Payer: Medicare Other

## 2023-07-10 ENCOUNTER — Encounter (HOSPITAL_COMMUNITY): Payer: Self-pay

## 2023-07-17 ENCOUNTER — Ambulatory Visit: Payer: Medicare Other | Admitting: Urology

## 2023-07-19 ENCOUNTER — Other Ambulatory Visit (HOSPITAL_COMMUNITY): Payer: Medicare Other

## 2023-07-19 ENCOUNTER — Ambulatory Visit (HOSPITAL_COMMUNITY): Payer: Medicare Other

## 2023-08-07 ENCOUNTER — Ambulatory Visit (HOSPITAL_COMMUNITY)
Admission: RE | Admit: 2023-08-07 | Discharge: 2023-08-07 | Disposition: A | Discharge status: Home / Self Care | Payer: Medicare Other | Source: Ambulatory Visit | Attending: Urology | Admitting: Urology

## 2023-08-07 DIAGNOSIS — Z09 Encounter for follow-up examination after completed treatment for conditions other than malignant neoplasm: Secondary | ICD-10-CM | POA: Diagnosis not present

## 2023-08-07 DIAGNOSIS — N200 Calculus of kidney: Secondary | ICD-10-CM | POA: Diagnosis not present

## 2023-08-09 ENCOUNTER — Ambulatory Visit: Payer: Medicare Other

## 2023-08-09 DIAGNOSIS — I255 Ischemic cardiomyopathy: Secondary | ICD-10-CM | POA: Diagnosis not present

## 2023-08-09 LAB — CUP PACEART REMOTE DEVICE CHECK
Battery Remaining Longevity: 44 mo
Battery Remaining Percentage: 53 %
Battery Voltage: 2.98 V
Brady Statistic AP VP Percent: 98 %
Brady Statistic AP VS Percent: 1 %
Brady Statistic AS VP Percent: 1.3 %
Brady Statistic AS VS Percent: 1 %
Brady Statistic RA Percent Paced: 97 %
Date Time Interrogation Session: 20241204020009
Implantable Lead Connection Status: 753985
Implantable Lead Connection Status: 753985
Implantable Lead Connection Status: 753985
Implantable Lead Implant Date: 20210607
Implantable Lead Implant Date: 20210607
Implantable Lead Implant Date: 20210607
Implantable Lead Location: 753858
Implantable Lead Location: 753859
Implantable Lead Location: 753860
Implantable Pulse Generator Implant Date: 20210607
Lead Channel Impedance Value: 440 Ohm
Lead Channel Impedance Value: 450 Ohm
Lead Channel Impedance Value: 800 Ohm
Lead Channel Pacing Threshold Amplitude: 0.75 V
Lead Channel Pacing Threshold Amplitude: 0.75 V
Lead Channel Pacing Threshold Amplitude: 1 V
Lead Channel Pacing Threshold Pulse Width: 0.5 ms
Lead Channel Pacing Threshold Pulse Width: 0.5 ms
Lead Channel Pacing Threshold Pulse Width: 0.5 ms
Lead Channel Sensing Intrinsic Amplitude: 2.7 mV
Lead Channel Sensing Intrinsic Amplitude: 8.3 mV
Lead Channel Setting Pacing Amplitude: 2 V
Lead Channel Setting Pacing Amplitude: 2 V
Lead Channel Setting Pacing Amplitude: 2 V
Lead Channel Setting Pacing Pulse Width: 0.5 ms
Lead Channel Setting Pacing Pulse Width: 0.5 ms
Lead Channel Setting Sensing Sensitivity: 2 mV
Pulse Gen Model: 3562
Pulse Gen Serial Number: 3818273

## 2023-08-10 DIAGNOSIS — R7303 Prediabetes: Secondary | ICD-10-CM | POA: Diagnosis not present

## 2023-08-10 DIAGNOSIS — E782 Mixed hyperlipidemia: Secondary | ICD-10-CM | POA: Diagnosis not present

## 2023-08-11 ENCOUNTER — Ambulatory Visit: Payer: Medicare Other | Admitting: Urology

## 2023-08-11 VITALS — BP 125/71 | HR 92

## 2023-08-11 DIAGNOSIS — N200 Calculus of kidney: Secondary | ICD-10-CM | POA: Diagnosis not present

## 2023-08-11 DIAGNOSIS — Z87442 Personal history of urinary calculi: Secondary | ICD-10-CM | POA: Diagnosis not present

## 2023-08-11 DIAGNOSIS — Z09 Encounter for follow-up examination after completed treatment for conditions other than malignant neoplasm: Secondary | ICD-10-CM

## 2023-08-11 LAB — URINALYSIS, ROUTINE W REFLEX MICROSCOPIC
Bilirubin, UA: NEGATIVE
Glucose, UA: NEGATIVE
Ketones, UA: NEGATIVE
Leukocytes,UA: NEGATIVE
Nitrite, UA: NEGATIVE
Protein,UA: NEGATIVE
RBC, UA: NEGATIVE
Specific Gravity, UA: 1.025 (ref 1.005–1.030)
Urobilinogen, Ur: 0.2 mg/dL (ref 0.2–1.0)
pH, UA: 6 (ref 5.0–7.5)

## 2023-08-11 LAB — LAB REPORT - SCANNED
A1c: 6.2
Albumin, Urine POC: 20.4
Albumin/Creatinine Ratio, Urine, POC: 27
Creatinine, POC: 76.2 mg/dL
EGFR: 78

## 2023-08-11 NOTE — Progress Notes (Unsigned)
08/11/2023 11:14 AM   [name] 1941/07/25 [id number]  Referring provider: Benita Stabile, MD [address] [place],  [place] [address]  nephrolithiasis   HPI: [name] is a 82yo here for followup for nephrolithiasis. Renal US 12/4 shows no calculi and no hydronephrosis. IPSS 6 QOL 0 on no therapy. He has a stable 4.4 cm rigth renal cyst. No flank pain.   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   Frequent PVCs & fatigue -> MYOVIEW w/ inferior /r MV distribution --> CATH: only RIMA-LAD patent (LIMA atretic & SVGs occluded) --> Staged PCI of Native RCA & Cx; followed by PCI-distal RCA   CAD S/P percutaneous coronary angioplasty 8/'12; 06/'16   a) PCI- Cx-OM W/ 2 overlapping Resolute DES 3x15 & 3x18 stents (3.58mm),  PCI-RCA -  Promus DES 3x38 (3.68mm);; b) 6/'16: PCI-dRCA-rPAV Promus DES 2.75X28 (3.0 mm) -- stents patent in 01/2018;  01/2018 -stents remain patent   CAS (cerebral atherosclerosis)    CAROTID DOPPLER, 08/25/2011 - mildly abnormal   Chronic back pain    Dilated cardiomyopathy (HCC): EF ~30-35%, 01/05/2018   11/2019 - EF ~30%, global HK with Septal dyssynergy --> 6/6/'21 - s/p CRT-P (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 unclear of etiology    Surgical History: Past Surgical History:  Procedure Laterality Date   BIV PACEMAKER INSERTION CRT-P N/A 02/10/2020   Procedure: BIV PACEMAKER INSERTION CRT-P;  Surgeon: Regan Lemming, MD;  Location: [facility];  Service: Cardiovascular;  Laterality: N/A;   CARDIAC CATHETERIZATION  07/15/2004   CABG recommended; continue medical therapy   CARDIAC CATHETERIZATION  04/26/2011   Patent RIMA-LAD, atretic LIMA-Dx (previous) 100 and CTO SVG to Cx and SVG to PDA. LAD 80-90% eccentric stenosis at SP1 that has 70% stenosis. Cx: Large OM and another distal branch with 90-95% stenosis followed by lesion in the OM branch. RCA dominant 60-80% lesion proximally w/ several old lesions. PDA occluded 70-80% annular lesion in the proximal RCA. 3 of 4 grafts CTO RIMA-LAD patent   CARDIAC CATHETERIZATION N/A 02/20/2015   Procedure: Left Heart Cath and Coronary Angiography;  Surgeon: Marykay Lex, MD;  Location: [facility]: Widely patent p-mRCA DES with dRCA 50%-PAVG 80% --> PCI. Patent overlapping stents --mCx-OM. 99% pLAD - patent RIMA-dLAD.  Known CTO SVG-rPL, SVG-OM3, :LIMA-D1.   CATARACT EXTRACTION W/  INTRAOCULAR LENS  IMPLANT, BILATERAL Bilateral ~ 2006/2007   CORONARY ANGIOPLASTY WITH STENT PLACEMENT  04/28/2011; 02/20/2015   PCI-Cx-OM 3 overlapping Resolute DES 3 mm x12mm and 3x61mm --> 3.39mm; PCI to RCA - Promus Element DES 3x26mm --> 3.63mm;; b) dRCA-rPAV: Promus Premier DES 2.52mm X 28 mm (3.0 mm)    CORONARY ARTERY BYPASS GRAFT  07/23/2004   LIMA-Cx RIMA-LAD, SVG-diagonal, SVG-PDA   EXTRACORPOREAL SHOCK WAVE LITHOTRIPSY Right 11/28/2022   Procedure: EXTRACORPOREAL SHOCK WAVE LITHOTRIPSY (ESWL) GAITED;  Surgeon: Heloise Purpura, MD;  Location: [facility];  Service: Urology;  Laterality: Right;  90  MINS   RIGHT/LEFT HEART CATH AND CORONARY/GRAFT ANGIOGRAPHY N/A 01/09/2018   Procedure: RIGHT/LEFT HEART CATH AND CORONARY/GRAFT ANGIOGRAPHY;  Surgeon: Marykay Lex, MD;; Known occluded LIMA-Diag & SVG-RCA, SVG-OM.  Mod-severe pLAD that is grafted after D1 - patent RIMA- wraparound LAD, distal 1/2 of PDA territory. Patent Cx-OM DES (ost Cx mild) & p-mRCA overlapping DES & dRCA-RPAV DES.  PCWP 12 mmHg, LVP-EDP: 140/6 - 14 mmH. RAP 2 mmHg. RVP-EDP 33/0 - 5  mmHg   TRANSTHORACIC ECHOCARDIOGRAM  02/18/2015    Mild concentric LVH, EF 55-60%, no RWMA, Gr 1 DD, aortic sclerosis without stenosis   TRANSTHORACIC ECHOCARDIOGRAM  12/2017; 06/2019   (New LBBB)  EF~35% with diffuse HK of septal & lateral walls.  Mildly dilated LV.  "GR 1 "DD with high filling pressures. ? w/ normal LA size; 10/20: EF 30-35%, LBBB related Septal dyskinesis, GR2 DD (but normal LA size).    TRANSTHORACIC ECHOCARDIOGRAM  11/27/2019   (On optimal/max tolerated dose of carvedilol and Entresto): EF estimated 30% with moderate to severe decreased function and global hypokinesis.  Significant septal-lateral dyssynchrony from LBBB.  Mild LV dilation.  At least GR 1 DD.  Mildly reduced RV function but normal pressures.  Mild aortic sclerosis but no significant regurgitation.   TRANSTHORACIC ECHOCARDIOGRAM  09/14/2020   (s/p CRT-P) EF estimated 30%. Moderately decreased function with global HK. GR 1 DD. Mildly reduced RV function. Mild aortic valve thickening/sclerosis but no stenosis. (Compared to prior study, no notable change in fxn, but decreased LV r dilation with slight decrease in RV function).   TRANSURETHRAL RESECTION OF PROSTATE  ~ 2002/2003    Home Medications:  Allergies as of 08/11/2023       Reactions   Meperidine Hcl    Cold sweats, dizziness    Penicillins    Severe headaches Has patient had a PCN reaction causing immediate rash, facial/tongue/throat swelling, SOB or lightheadedness with hypotension: No Has patient  had a PCN reaction causing severe rash involving mucus membranes or skin necrosis: No Has patient had a PCN reaction that required hospitalization: No Has patient had a PCN reaction occurring within the last 10 years: No If all of the above answers are "NO", then may proceed with Cephalosporin use.        Medication List        Accurate as of August 11, 2023 11:14 AM. If you have any questions, ask your nurse or doctor.          ALPRAZolam 0.5 MG tablet Commonly known as: XANAX Take 0.5 mg by mouth 2 (two) times daily as needed for anxiety (tremors).   aspirin EC 81 MG tablet Take 81 mg by mouth daily.   bisoprolol 5 MG tablet Commonly known as: ZEBETA Take 1 tablet (5 mg total) by mouth every evening.   bisoprolol-hydrochlorothiazide 2.5-6.25 MG tablet Commonly known as: ZIAC Take 1  tablet by mouth every morning.   chlorzoxazone 500 MG tablet Commonly known as: PARAFON Take 500 mg by mouth daily as needed for muscle spasms.   furosemide 20 MG tablet Commonly known as: LASIX Take 1 tablet (20 mg total) by mouth as needed for edema.   HYDROcodone-acetaminophen 5-325 MG tablet Commonly known as: NORCO/VICODIN Take 1 tablet by mouth every 6 (six) hours as needed for severe pain.   ondansetron 4 MG tablet Commonly known as: Zofran Take 1 tablet (4 mg total) by mouth every 8 (eight) hours as needed for nausea or vomiting.   spironolactone 25 MG tablet Commonly known as: ALDACTONE Take 0.5 tablets (12.5 mg total) by mouth daily.   Symbicort 160-4.5 MCG/ACT inhaler Generic drug: budesonide-formoterol Inhale 2 puffs into the lungs in the morning and at bedtime.   SYSTANE OP Place 1 drop into both eyes 2 (two) times daily.   tamsulosin 0.4 MG Caps capsule Commonly known as: FLOMAX Take 1 capsule (0.4 mg total) by mouth daily after supper.   Vytorin 10-10 MG tablet Generic drug: ezetimibe-simvastatin Take 1 tablet by mouth at bedtime.        Allergies:   Allergies  Allergen Reactions   Meperidine Hcl     Cold sweats, dizziness    Penicillins     Severe headaches Has patient had a PCN reaction causing immediate rash, facial/tongue/throat swelling, SOB or lightheadedness with hypotension: No Has patient had a PCN reaction causing severe rash involving mucus membranes or skin necrosis: No Has patient had a PCN reaction that required hospitalization: No Has patient had a PCN reaction occurring within the last 10 years: No If all of the above answers are "NO", then may proceed with Cephalosporin use.     Family History: Family History  Problem Relation Age of Onset   Heart disease Father 64   Heart disease Brother    Colon cancer Brother        and liver cancer   Stroke Mother 71   Hypertension Mother 36   Stomach cancer Sister    Hypertension Brother 106   Esophageal cancer Neg Hx    Rectal cancer Neg Hx     Social History:  reports that he quit smoking about 59 years ago. His smoking use included cigarettes. He started smoking about 66 years ago. He has a 7 pack-year smoking history. He has never used smokeless tobacco. He reports that he does not drink alcohol and does not use drugs.  ROS: All other review of systems were reviewed and are negative except what is noted above in HPI  Physical Exam: BP 125/71   Pulse 92   Constitutional:  Alert and oriented, No acute distress. HEENT: [place] AT, moist mucus membranes.  Trachea midline, no masses. Cardiovascular: No clubbing, cyanosis, or edema. Respiratory: Normal respiratory effort, no increased work of breathing. GI: Abdomen is soft, nontender, nondistended, no abdominal masses GU: No CVA tenderness.  Lymph: No cervical or inguinal lymphadenopathy. Skin: No rashes, bruises or suspicious lesions. Neurologic: Grossly intact, no focal deficits, moving all 4 extremities. Psychiatric: Normal mood and affect.  Laboratory Data: Lab Results  Component Value Date   WBC 19.8 (H)  10/22/2022   HGB 15.3 10/22/2022   HCT 46.7 10/22/2022   MCV 86.2 10/22/2022   PLT 246 10/22/2022    Lab Results  Component Value Date   CREATININE 1.17 12/09/2022    No results found for: "PSA"  No results found for: "TESTOSTERONE"  No results found  for: "HGBA1C"  Urinalysis    Component Value Date/Time   COLORURINE YELLOW 10/22/2022 0950   APPEARANCEUR Clear 01/16/2023 1401   LABSPEC 1.020 10/22/2022 0950   PHURINE 5.0 10/22/2022 0950   GLUCOSEU Negative 01/16/2023 1401   HGBUR LARGE (A) 10/22/2022 0950   BILIRUBINUR Negative 01/16/2023 1401   KETONESUR NEGATIVE 10/22/2022 0950   PROTEINUR Negative 01/16/2023 1401   PROTEINUR 100 (A) 10/22/2022 0950   UROBILINOGEN 1.0 03/06/2014 1406   NITRITE Negative 01/16/2023 1401   NITRITE NEGATIVE 10/22/2022 0950   LEUKOCYTESUR Negative 01/16/2023 1401   LEUKOCYTESUR NEGATIVE 10/22/2022 0950    Lab Results  Component Value Date   LABMICR Comment 01/16/2023   WBCUA 0-5 11/08/2022   LABEPIT 0-10 11/08/2022   BACTERIA None seen 11/08/2022    Pertinent Imaging: Renal US 08/07/2023: Images reviewed and discussed with the patient  Results for orders placed during the hospital encounter of 11/28/22  DG Abd 1 View  Narrative CLINICAL DATA:  Several calcifications  EXAM: ABDOMEN - 1 VIEW  COMPARISON:  11/08/2022.  FINDINGS: Measuring 3-4 mm overlie both kidneys. Unremarkable bowel gas pattern.  IMPRESSION: Bilateral nephrolithiasis.   Electronically Signed By: Layla Maw M.D. On: 11/29/2022 13:33  No results found for this or any previous visit.  No results found for this or any previous visit.  No results found for this or any previous visit.  Results for orders placed during the hospital encounter of 08/07/23  Ultrasound renal complete  Narrative CLINICAL DATA:  Follow-up exam  EXAM: RENAL / URINARY TRACT ULTRASOUND COMPLETE  COMPARISON:  Renal ultrasound 01/13/2023  FINDINGS: Right  Kidney:  Renal measurements: 10.2 x 5.8 x 6.4 cm = volume: 195 mL. Normal renal cortical thickness and echogenicity. No hydronephrosis. There is a 4.3 x 4.4 cm cyst. No imaging follow-up needed.  Left Kidney:  Renal measurements: 10.5 x 6.4 x 5.1 cm = volume: 178 mL. Echogenicity within normal limits. No mass or hydronephrosis visualized.  Bladder:  Appears normal for degree of bladder distention.  Other:  None.  IMPRESSION: No hydronephrosis.   Electronically Signed By: Annia Belt M.D. On: 08/07/2023 13:30  No valid procedures specified. No results found for this or any previous visit.  Results for orders placed during the hospital encounter of 10/22/22  CT Renal Stone Study  Narrative CLINICAL DATA:  Flank pain  EXAM: CT ABDOMEN AND PELVIS WITHOUT CONTRAST  TECHNIQUE: Multidetector CT imaging of the abdomen and pelvis was performed following the standard protocol without IV contrast.  RADIATION DOSE REDUCTION: This exam was performed according to the departmental dose-optimization program which includes automated exposure control, adjustment of the mA and/or kV according to patient size and/or use of iterative reconstruction technique.  COMPARISON:  07/01/2022  FINDINGS: Lower chest: No acute abnormality.  There are pacer wires.  Hepatobiliary: No focal liver abnormality is seen. No gallstones, gallbladder wall thickening, or biliary dilatation.  Pancreas: Unremarkable. No pancreatic ductal dilatation or surrounding inflammatory changes.  Spleen: Normal in size without focal abnormality.  Adrenals/Urinary Tract: Bilateral nephrolithiasis identified. Largest stone is 5 mm right midpole. Largest stone on the left is 4 mm mid to lower pole. There is right-sided perinephric stranding and hydronephrosis with a proximal right ureteral stone that measures 8 mm.  Stomach/Bowel: Stomach is within normal limits. Appendix appears normal. No evidence of  bowel wall thickening, distention, or inflammatory changes. Sigmoid diverticulosis identified without diverticulitis.  Vascular/Lymphatic: Aortic atherosclerosis. No enlarged abdominal or pelvic lymph nodes. Retroaortic left renal vein  noted.  Reproductive: Prostate is unremarkable.  Other: No abdominal wall hernia or abnormality. No abdominopelvic ascites.  Musculoskeletal: No acute or significant osseous findings. L5-S1 disc space narrowing and sclerosis consistent with degenerative disc disease.  IMPRESSION: 1. Bilateral nephrolithiasis. 2. Proximal right ureteral 8 mm stone with obstruction. 3. Diverticulosis.   Electronically Signed By: Layla Maw M.D. On: 10/22/2022 09:11   Assessment & Plan:    1. Kidney stones -dietary handout given -followup 1 year with a renal US - Urinalysis, Routine w reflex microscopic   No follow-ups on file.  Wilkie Aye, MD  [facility] [place]

## 2023-08-14 ENCOUNTER — Ambulatory Visit: Payer: Medicare Other | Admitting: Cardiology

## 2023-08-15 ENCOUNTER — Encounter: Payer: Self-pay | Admitting: Urology

## 2023-08-15 NOTE — Patient Instructions (Signed)

## 2023-08-16 DIAGNOSIS — Z23 Encounter for immunization: Secondary | ICD-10-CM | POA: Diagnosis not present

## 2023-08-16 DIAGNOSIS — N200 Calculus of kidney: Secondary | ICD-10-CM | POA: Diagnosis not present

## 2023-08-16 DIAGNOSIS — I110 Hypertensive heart disease with heart failure: Secondary | ICD-10-CM | POA: Diagnosis not present

## 2023-08-16 DIAGNOSIS — Z950 Presence of cardiac pacemaker: Secondary | ICD-10-CM | POA: Diagnosis not present

## 2023-08-16 DIAGNOSIS — J4540 Moderate persistent asthma, uncomplicated: Secondary | ICD-10-CM | POA: Diagnosis not present

## 2023-08-16 DIAGNOSIS — M545 Low back pain, unspecified: Secondary | ICD-10-CM | POA: Diagnosis not present

## 2023-08-16 DIAGNOSIS — I10 Essential (primary) hypertension: Secondary | ICD-10-CM | POA: Diagnosis not present

## 2023-08-16 DIAGNOSIS — R7303 Prediabetes: Secondary | ICD-10-CM | POA: Diagnosis not present

## 2023-08-16 DIAGNOSIS — I5042 Chronic combined systolic (congestive) and diastolic (congestive) heart failure: Secondary | ICD-10-CM | POA: Diagnosis not present

## 2023-08-16 DIAGNOSIS — I420 Dilated cardiomyopathy: Secondary | ICD-10-CM | POA: Diagnosis not present

## 2023-08-16 DIAGNOSIS — E291 Testicular hypofunction: Secondary | ICD-10-CM | POA: Diagnosis not present

## 2023-08-16 DIAGNOSIS — R978 Other abnormal tumor markers: Secondary | ICD-10-CM | POA: Diagnosis not present

## 2023-09-29 ENCOUNTER — Ambulatory Visit: Payer: Medicare Other | Admitting: Urology

## 2023-09-29 DIAGNOSIS — H353131 Nonexudative age-related macular degeneration, bilateral, early dry stage: Secondary | ICD-10-CM | POA: Diagnosis not present

## 2023-09-29 DIAGNOSIS — H43393 Other vitreous opacities, bilateral: Secondary | ICD-10-CM | POA: Diagnosis not present

## 2023-09-29 DIAGNOSIS — H43813 Vitreous degeneration, bilateral: Secondary | ICD-10-CM | POA: Diagnosis not present

## 2023-10-10 ENCOUNTER — Ambulatory Visit: Payer: Medicare Other | Attending: Cardiology | Admitting: Cardiology

## 2023-10-10 ENCOUNTER — Encounter: Payer: Self-pay | Admitting: Cardiology

## 2023-10-10 VITALS — BP 112/68 | HR 75 | Ht 68.0 in | Wt 181.8 lb

## 2023-10-10 DIAGNOSIS — I2510 Atherosclerotic heart disease of native coronary artery without angina pectoris: Secondary | ICD-10-CM | POA: Diagnosis not present

## 2023-10-10 DIAGNOSIS — Z9861 Coronary angioplasty status: Secondary | ICD-10-CM | POA: Insufficient documentation

## 2023-10-10 DIAGNOSIS — I447 Left bundle-branch block, unspecified: Secondary | ICD-10-CM | POA: Diagnosis not present

## 2023-10-10 DIAGNOSIS — E785 Hyperlipidemia, unspecified: Secondary | ICD-10-CM | POA: Diagnosis not present

## 2023-10-10 DIAGNOSIS — I951 Orthostatic hypotension: Secondary | ICD-10-CM | POA: Diagnosis not present

## 2023-10-10 DIAGNOSIS — M791 Myalgia, unspecified site: Secondary | ICD-10-CM | POA: Insufficient documentation

## 2023-10-10 DIAGNOSIS — T466X5A Adverse effect of antihyperlipidemic and antiarteriosclerotic drugs, initial encounter: Secondary | ICD-10-CM | POA: Insufficient documentation

## 2023-10-10 DIAGNOSIS — I10 Essential (primary) hypertension: Secondary | ICD-10-CM | POA: Insufficient documentation

## 2023-10-10 DIAGNOSIS — I420 Dilated cardiomyopathy: Secondary | ICD-10-CM | POA: Diagnosis not present

## 2023-10-10 DIAGNOSIS — I25708 Atherosclerosis of coronary artery bypass graft(s), unspecified, with other forms of angina pectoris: Secondary | ICD-10-CM | POA: Insufficient documentation

## 2023-10-10 DIAGNOSIS — I493 Ventricular premature depolarization: Secondary | ICD-10-CM | POA: Diagnosis not present

## 2023-10-10 DIAGNOSIS — I5042 Chronic combined systolic (congestive) and diastolic (congestive) heart failure: Secondary | ICD-10-CM | POA: Insufficient documentation

## 2023-10-10 MED ORDER — ATORVASTATIN CALCIUM 10 MG PO TABS: 10.0000 mg | ORAL_TABLET | Freq: Every day | ORAL | 3 refills | Status: AC

## 2023-10-10 NOTE — Progress Notes (Signed)
 Cardiology Office Note:  .   Date:  10/11/2023  ID:  [name], [name] 24-Feb-1941, MRN [id number] PCP: Shona Norleen PEDLAR, MD  [facility] Providers Cardiologist:  Alm Clay, MD Electrophysiologist:  Soyla Gladis Norton, MD     Chief Complaint  Patient presents with   Follow-up   Coronary Artery Disease    No angina   Cardiomyopathy    No real heartburn symptoms.  Overall feels better with PPM adjustment.    Patient Profile: [name] is a  83 y.o. male  with a PMH notable for CAD-CABG-PCI, HTN, HLD and LBBB with ICM-chronic HFrEF (EF 30 to 35% thought to be related to LBBB with elevated PVC burden.) s/p CRT-P (June 2021) who presents here for 8 month at the request of Shona Norleen PEDLAR, MD.  CAD->CABG 07/2004 (Sx = fatigue & PVCs) --> CATH -> MV CAD --> CABG X 4 (RIMA-LAD, LIMA-Diag, SVG-OM, SVG-RCA) 04/2011 - recurrent Sx -> Myoview + for Inf Ischemia --> Cath => Only RIMA-LAD patent --> DES PCI of Native Cx-OM (2 overlapping Promus DES) & Native RCA June 2016 - DES PCI of the dRCA-RPAV. Since then he has done very well with no active anginal symptoms. COMBINED SYSTOLIC/DIASTOLIC CHF / ISCHEMIC-NON-ISCHEMIC CM 2/2 LBBB 12/2017 - LBBB--> ECHO EF ~35% -> Cath 01/2018 w/ patent stents & RIMA-LAD. EF ~35-40%. Normal RHC (PCWP 12 mmHg, LVEDP 14 mmHg) 06/2019 - Echo EF down to 30-35% - referred to Dr. Norton for CRT (P vs. D) -> delayed in order toOPTIMIZE Med  Rx  -- attemtped to place on Carvedilol  & Entreso (has never been able totoalerate any meds - BB or ARB in the past)       CRT-P (not Defib b/c felt that with CRT, EF could improve) -St Jude BiVPPM => nominal improvement in E level  Following CRT-P-- he stopped taking Entresto  & was converted to Bisoprolol  2.5 mg.     [name] was last seen on Jan 24, 2023 -> Zio patch monitor showing PVCs, he is feeling fatigued and dyspneic.  Referred back to EP.  He was then seen by Dr. Norton on June 10 => heart rate increased on  CRT-P pacing device to help manage PVC burden.  Subjective  Discussed the use of AI scribe software for clinical note transcription with the patient, who gave verbal consent to proceed.  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  He has experienced a significant reduction in premature ventricular contractions (PVCs) since his pacemaker rate was increased to 75 beats per minute. Prior to this adjustment, the pacemaker was set at 60 beats per minute. Since the increase, he has experienced fewer PVCs and feels that his heart is being paced more effectively, with pacing occurring almost 90% of the time.  He mentions fluctuations in his blood pressure, with readings previously reaching as high as 168 mmHg systolic, but now often measuring around 110-124 mmHg. His blood pressure can drop to as low as 102 mmHg. He has not experienced any issues with his heart rate, which increases appropriately with physical activity. He is currently taking bisoprolol  5 mg at night and half a dose of spironolactone  in the morning. He occasionally takes furosemide  20 mg,  particularly when he notices water retention, which he can detect by changes in his abdomen. He has not been taking Ziac  in the morning for several months due to his lower blood pressure readings.  He discontinued Vytorin  due to side effects of dizziness and forgetfulness, which interfered with his ability to care for his wife. He is considering restarting Lipitor at a low dose to manage his cholesterol, as he has previously tolerated it well and it effectively lowered his cholesterol levels.  We spent about 8 min discussing lipid management - especially in light of his advancing years.  To truly treat lipids, we will probably need to use  Injectables, and he indicates that he would not be interested in giving himself injections every 2 weeks.  He may be willing to consider Inclisarin injections.   No chest pain or pressure at rest or with exertion to the extent that he exerts.  No shortness of breath when lying down or waking up at night due to breathlessness. He can be active for a couple of hours before needing to rest, and his activity level is influenced by the amount of rest he gets at night. 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    He denies any rapid / irregular heartbeats or rhythms.  No syncope/near syncope or TIA/CVA/amaurosis fugax symptoms.    - No angina, discomfort in chest, or symptoms of heart failure noted. Patient is mostly paced with improved PVC management.     Cardiovascular ROS: positive for - dyspnea on exertion, palpitations, and feels better after adjustment of PPM; rare palpitations/PVCs. negative for - chest pain, orthopnea, paroxysmal nocturnal dyspnea, rapid heart rate, shortness of breath, or syncope or near CPM TIA or amaurosis fugax, claudication.    Objective   Medications - Bisoprolol  HCTZ (2.5 - 6.25 MG in the morning - NO LONGER TAKING - ASA 81 mg daily - Bisoprolol  5 mg at night - STILL TAKING - Spironolactone  25 MG tab 1/2 TAB qAM - Furosemide  20 mg once a week - Parafon  500 mg PRN muscle spasm - Symbicort INH BID  Studies Reviewed: [name]   EKG Interpretation Date/Time:  Tuesday October 10 2023 11:38:34 EST Ventricular Rate:  75 PR Interval:  162 QRS Duration:  160 QT Interval:  426 QTC Calculation: 475 R Axis:   186  Text Interpretation: AV dual-paced rhythm When compared with ECG of 10-Feb-2020 17:22, Premature ventricular complexes are no longer Present Vent. rate has increased BY   4 BPM Confirmed by Anner Lenis ([id number]) on 10/10/2023 11:45:40 AM appears like BiV pacing  Previous Studies Reviewed-and discussed with  patient ECHO (09/14/2020): EF 30%.  Global HK.  GR 1 DD.  Moderate reduced RV function.  Decrease in LV dilation noted.  Relative normal valves. Right Left Heart Cath (01/09/2018): Known multivessel disease.  Ost LAD lesion is 80% stenosed. Prox LAD lesion is 60% stenosed. Ost 2nd Diag lesion is 85% stenosed; patent ostial to proximal as well as distal RCA-PDA stents, widely patent LCx stents.  Widely patent RIMA to LAD and known occlusion of SVG-OM 3, SVG-RPDA and LIMA-diagonal;  PCWP 12 mmHg, LVP-EDP: 140/6 mmHg - 14 mmHg.  RAP 2 mmHg, RVP-EDP 33/0 mmHg - 5 mmHg.    Zio patch monitor April 2024: Predominant sinus rhythm with V pacing.  22 SVT/PAT episodes longest and fastest run 1 minute 2 seconds with a rate of 1.2 bpm.  Less than 1% PACs and 19.4% PVCs.  Risk Assessment/Calculations:             Physical Exam:   VS:  BP 112/68 (BP Location: Left Arm, Patient Position: Sitting, Cuff Size: Normal)   Pulse 75   Ht 5' 8 (1.727 m)   Wt 181 lb 12.8 oz (82.5 kg)   SpO2 96%   BMI 27.64 kg/m    Wt Readings from Last 3 Encounters:  10/10/23 181 lb 12.8 oz (82.5 kg)  02/13/23 180 lb (81.6 kg)  01/24/23 177 lb (80.3 kg)    GEN: Well nourished, well groomed in no acute distress; other than mild truncal obesity, healthy-appearing.  Looks younger than stated age.  Maybe a little bit depressed mood NECK: No JVD; No carotid bruits CARDIAC: Normal S1, S2; RRR, no murmurs, rubs, gallops RESPIRATORY:  Clear to auscultation without rales, wheezing or rhonchi ; nonlabored, good air movement. ABDOMEN: Soft, non-tender, non-distended EXTREMITIES:  No edema; No deformity      ASSESSMENT AND PLAN: .    Problem List Items Addressed This Visit       Cardiology Problems   CAD (coronary artery disease) of artery bypass graft -atretic LIMA- circumflex, occluded SVG-RCA and SVG-diagonal. (Chronic)   Relevant Medications   atorvastatin  (LIPITOR) 10 MG tablet   Other Relevant Orders   EKG 12-Lead  (Completed)   ECHOCARDIOGRAM COMPLETE   CAD S/P  PCI of Cx-OM w/ 2 Resolute DES 3.0 x 15 & 3.0 x 18 (3.63mm); PCI-pRCA: Promus Element DES 3.0 x 38 (3.63mm); dRCA-rPAV: Promus Premier DES 2.75x28 (3 mm) (Chronic)   Surprisingly doing well with no true anginal symptoms since his native PCI to both RCA and LCx.  About the only vessel that was left on revascularized was a small diagonal branch.  He really never had true anginal symptoms but was having lots of ectopy.  I was little bit concerned with the PVC burden but this seems to be better controlled with the bisoprolol  and increase PPM rate.  However, with concerns of exercise intolerance and potential recurrence of PVCs would have a low threshold for ischemic evaluation.  He voices interest in cardiac catheterization although I do not have a clear indication at this point.  -Continue aspirin  81 mg, bisoprolol  5 mg daily and spironolactone  12.5 mg daily. -Lipids been poorly controlled-Long discussion about lipid management noted elsewhere.  For now we will start Lipitor 10 mg daily.      Relevant Medications   atorvastatin  (LIPITOR) 10 MG tablet   Other Relevant Orders   EKG 12-Lead (Completed)   ECHOCARDIOGRAM COMPLETE   Chronic combined systolic and diastolic heart failure (HCC) (Chronic)   Really NYHA class II symptoms he does have some exercise related dyspnea but is quite deconditioned now.  He is retired and has not really been exercise.  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  Trivial edema.  No PND orthopnea.  Euvolemic by exam.  He is once a week furosemide  and may be every so often will take a second dose PRN. Has been intolerant of most medications for GDMT including ACE-I/ARB orARNI (which were tried prior to CRT-P).  Similarly, did not tolerate carvedilol  for Toprol.  Is on low-dose bisoprolol  and tolerating. Was started on spironolactone  by Dr. Fernande and he seems to doing relatively well with it.  -Continue bisoprolol  5 mg every afternoon  and spironolactone  12.5 mg every morning  Improved symptoms with increased pacemaker rate to 75, leading to increased pacing and decreased PVCs. EF last checked in 2019. -Order echocardiogram  to reassess EF now that patient is mostly paced. -Continue current pacemaker settings.      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  Now status post CRT-P      Relevant Medications   atorvastatin  (LIPITOR) 10 MG tablet   Dilated cardiomyopathy (HCC): EF ~35%, new LBBB - no change to Coronary Anatony on Cath - Primary (Chronic)   Likely to be related as he did not have any significant new ischemic findings on his cardiac catheterization consistent with the drop in EF.  Now status post CRT-P with BiV pacing at an increased rate. He is also on pretty much max tolerated GDMT with history of hypotension.  He is only on 5 has bisoprolol  and 12.5 spironolactone .  Not tolerant of other medications. Trivial edema on low-dose once or twice weekly furosemide  not requiring additional dosing.  Reassess 2D echo to evaluate potential change in EF with better synchronization therapy.  QRS complexes are notably narrow suggesting adequate BiV pacing.      Relevant Medications   atorvastatin  (LIPITOR) 10 MG tablet   Other Relevant Orders   ECHOCARDIOGRAM COMPLETE   Essential hypertension (Chronic)   Well-controlled BP on bisoprolol  and spironolactone  Intolerant of titration of other medications -Continue spironolactone  12.5 mg every morning and bisoprolol  5 mg daily every afternoon.      Relevant Medications   atorvastatin  (LIPITOR) 10 MG tablet   Other Relevant Orders   EKG 12-Lead (Completed)   Hyperlipidemia with target LDL less than 70 (Chronic)   Relevant Medications   atorvastatin  (LIPITOR) 10 MG tablet   Orthostatic hypotension - with tremor  (Chronic)   Not very tolerant of medications.  He is on low-dose spironolactone  and bisoprolol , but has not been intolerant at all ARB/ARNI or ACE inhibitor.  He in fact has stopped his morning dose of bisoprolol -HCTZ because of low blood pressures.  He is having issues with dizziness and falls but has been doing better off of the Ziac .      Relevant Medications   atorvastatin  (LIPITOR) 10 MG tablet   PVC's (premature ventricular contractions) (Chronic)   Note that frequent PVCs on top of his LBBB based on EKG. Seen and followed Dr. Inocencio as well as Dr. Fernande. Was continued on current dose of bisoprolol , but made treatment was increasing his pacer rate.  Since doing this, he actually feels a whole lot better, and does not feel the PVCs.  In the past and PVCs and somewhat indicative of potential ischemia. Low threshold for ischemic evaluation      Relevant Medications   atorvastatin  (LIPITOR) 10 MG tablet   Other Relevant Orders   EKG 12-Lead (Completed)   ECHOCARDIOGRAM COMPLETE     Other   Myalgia due to statin (Chronic)   He has been challenged himself with rosuvastatin simvastatin , Vytorin  and pravastatin all of which were finally obtained to stop.  He did not tolerate solitary Zetia .   He was recently restarted back on Vytorin  by his PCP.  However, he stopped Vytorin  due to side effects of dizziness and forgetfulness. Discussed restarting statin therapy with Lipitor 10mg  and potential use of injectable cholesterol medications. -Start Lipitor 10mg  and reassess tolerance and lipid levels in a few months. -Long discussion about the potential use of SGLT2 inhibitors versus inclisiran/Leqvio.  He indicates that he would probably not want to give himself  injections on a routine basis but may be agreeable to Leqvio.  However, he still wants to try to start something he knows first. Will arrange close follow-up lipids to reassess potentially for to CVRR.  This was about 8 to 10-minute  discussion.        Follow-up in August 2025. : Return in about 6 months (around 04/08/2024) for 6 month follow-up with me.   I spent 61 minutes in the care of [name] today including reviewing labs (labs from [facility]CP[facility], 2 minutes), reviewing studies (5 minutes reviewing previous cath and echo films as well as Zio patch), face to face time discussing treatment options (42 minutes-we discussed lipid management, pacemaker management, CHF management as well as ischemic CAD evaluation.  Multiple questions asked and answered.  We reviewed his studies, and discussed plans.), reviewing records from my previous notes as well as notes from Dr. Fernande and Dr. Inocencio (9 minutes), 13 min dictating, and documenting in the encounter.     Signed, Alm MICAEL Clay, MD, MS Alm Clay, M.D., M.S. Interventional Cardiologist  [facility]  Pager # [phone] Phone # [phone] [address] [place], [place] [address]

## 2023-10-10 NOTE — Patient Instructions (Addendum)
 Medication Instructions:    Stop simvastatin /zetia  ( Vytorin )   Start Atorvastatin  ( Lipitor) 10 mg daily  *If you need a refill on your cardiac medications before your next appointment, please call your pharmacy*   Lab Work: Not needed    Testing/Procedures: Your physician has requested that you have an echocardiogram. Echocardiography is a painless test that uses sound waves to create images of your heart. It provides your doctor with information about the size and shape of your heart and how well your heart's chambers and valves are working. This procedure takes approximately one hour. There are no restrictions for this procedure. Please do NOT wear cologne, perfume, aftershave, or lotions (deodorant is allowed). Please arrive 15 minutes prior to your appointment time.  Please note: We ask at that you not bring children with you during ultrasound (echo/ vascular) testing. Due to room size and safety concerns, children are not allowed in the ultrasound rooms during exams. Our front office staff cannot provide observation of children in our lobby area while testing is being conducted. An adult accompanying a patient to their appointment will only be allowed in the ultrasound room at the discretion of the ultrasound technician under special circumstances. We apologize for any inconvenience.    Follow-Up: At [facility], you and your health needs are our priority.  As part of our continuing mission to provide you with exceptional heart care, we have created designated Provider Care Teams.  These Care Teams include your primary Cardiologist (physician) and Advanced Practice Providers (APPs -  Physician Assistants and Nurse Practitioners) who all work together to provide you with the care you need, when you need it.     Your next appointment:   6 month(s)  The format for your next appointment:   In Person  Provider:   Alm Clay, MD

## 2023-10-11 ENCOUNTER — Encounter: Payer: Self-pay | Admitting: Cardiology

## 2023-10-11 NOTE — Assessment & Plan Note (Signed)
 Likely to be related as he did not have any significant new ischemic findings on his cardiac catheterization consistent with the drop in EF.  Now status post CRT-P with BiV pacing at an increased rate. He is also on pretty much max tolerated GDMT with history of hypotension.  He is only on 5 has bisoprolol  and 12.5 spironolactone .  Not tolerant of other medications. Trivial edema on low-dose once or twice weekly furosemide  not requiring additional dosing.  Reassess 2D echo to evaluate potential change in EF with better synchronization therapy.  QRS complexes are notably narrow suggesting adequate BiV pacing.

## 2023-10-11 NOTE — Assessment & Plan Note (Signed)
 He has been challenged himself with rosuvastatin simvastatin , Vytorin  and pravastatin all of which were finally obtained to stop.  He did not tolerate solitary Zetia .   He was recently restarted back on Vytorin  by his PCP.  However, he stopped Vytorin  due to side effects of dizziness and forgetfulness. Discussed restarting statin therapy with Lipitor 10mg  and potential use of injectable cholesterol medications. -Start Lipitor 10mg  and reassess tolerance and lipid levels in a few months. -Long discussion about the potential use of SGLT2 inhibitors versus inclisiran/Leqvio.  He indicates that he would probably not want to give himself injections on a routine basis but may be agreeable to Leqvio.  However, he still wants to try to start something he knows first. Will arrange close follow-up lipids to reassess potentially for to CVRR.  This was about 8 to 10-minute discussion.

## 2023-10-11 NOTE — Assessment & Plan Note (Signed)
 Well-controlled BP on bisoprolol  and spironolactone  Intolerant of titration of other medications -Continue spironolactone  12.5 mg every morning and bisoprolol  5 mg daily every afternoon.

## 2023-10-11 NOTE — Assessment & Plan Note (Signed)
 Really NYHA class II symptoms he does have some exercise related dyspnea but is quite deconditioned now.  He is retired and has not really been exercise.  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  Trivial edema.  No PND orthopnea.  Euvolemic by exam.  He is once a week furosemide  and may be every so often will take a second dose PRN. Has been intolerant of most medications for GDMT including ACE-I/ARB orARNI (which were tried prior to CRT-P).  Similarly, did not tolerate carvedilol  for Toprol.  Is on low-dose bisoprolol  and tolerating. Was started on spironolactone  by Dr. Fernande and he seems to doing relatively well with it.  -Continue bisoprolol  5 mg every afternoon and spironolactone  12.5 mg every morning  Improved symptoms with increased pacemaker rate to 75, leading to increased pacing and decreased PVCs. EF last checked in 2019. -Order echocardiogram to reassess EF now that patient is mostly paced. -Continue current pacemaker settings.

## 2023-10-11 NOTE — Assessment & Plan Note (Signed)
 Not very tolerant of medications.  He is on low-dose spironolactone  and bisoprolol , but has not been intolerant at all ARB/ARNI or ACE inhibitor.  He in fact has stopped his morning dose of bisoprolol -HCTZ because of low blood pressures.  He is having issues with dizziness and falls but has been doing better off of the Ziac .

## 2023-10-11 NOTE — Assessment & Plan Note (Signed)
 Surprisingly doing well with no true anginal symptoms since his native PCI to both RCA and LCx.  About the only vessel that was left on revascularized was a small diagonal branch.  He really never had true anginal symptoms but was having lots of ectopy.  I was little bit concerned with the PVC burden but this seems to be better controlled with the bisoprolol  and increase PPM rate.  However, with concerns of exercise intolerance and potential recurrence of PVCs would have a low threshold for ischemic evaluation.  He voices interest in cardiac catheterization although I do not have a clear indication at this point.  -Continue aspirin  81 mg, bisoprolol  5 mg daily and spironolactone  12.5 mg daily. -Lipids been poorly controlled-Long discussion about lipid management noted elsewhere.  For now we will start Lipitor 10 mg daily.

## 2023-10-11 NOTE — Assessment & Plan Note (Signed)
 Note that frequent PVCs on top of his LBBB based on EKG. Seen and followed Dr. Inocencio as well as Dr. Fernande. Was continued on current dose of bisoprolol , but made treatment was increasing his pacer rate.  Since doing this, he actually feels a whole lot better, and does not feel the PVCs.  In the past and PVCs and somewhat indicative of potential ischemia. Low threshold for ischemic evaluation

## 2023-10-11 NOTE — Assessment & Plan Note (Signed)
 Alternate region cardiomyopathy.  Now status post CRT-P

## 2023-10-30 ENCOUNTER — Encounter: Payer: Self-pay | Admitting: Cardiology

## 2023-10-30 ENCOUNTER — Ambulatory Visit (HOSPITAL_COMMUNITY): Payer: Medicare Other | Attending: Cardiology

## 2023-10-30 DIAGNOSIS — I2510 Atherosclerotic heart disease of native coronary artery without angina pectoris: Secondary | ICD-10-CM | POA: Diagnosis not present

## 2023-10-30 DIAGNOSIS — I420 Dilated cardiomyopathy: Secondary | ICD-10-CM | POA: Diagnosis not present

## 2023-10-30 DIAGNOSIS — I5042 Chronic combined systolic (congestive) and diastolic (congestive) heart failure: Secondary | ICD-10-CM | POA: Diagnosis not present

## 2023-10-30 DIAGNOSIS — Z9861 Coronary angioplasty status: Secondary | ICD-10-CM | POA: Insufficient documentation

## 2023-10-30 DIAGNOSIS — I493 Ventricular premature depolarization: Secondary | ICD-10-CM | POA: Diagnosis not present

## 2023-10-30 DIAGNOSIS — I25708 Atherosclerosis of coronary artery bypass graft(s), unspecified, with other forms of angina pectoris: Secondary | ICD-10-CM | POA: Diagnosis not present

## 2023-10-30 HISTORY — PX: TRANSTHORACIC ECHOCARDIOGRAM: SHX275

## 2023-10-30 LAB — ECHOCARDIOGRAM COMPLETE
Area-P 1/2: 3.85 cm2
Est EF: 45
P 1/2 time: 259 ms
S' Lateral: 4.1 cm

## 2023-11-08 ENCOUNTER — Ambulatory Visit (INDEPENDENT_AMBULATORY_CARE_PROVIDER_SITE_OTHER): Payer: Medicare Other

## 2023-11-08 DIAGNOSIS — I420 Dilated cardiomyopathy: Secondary | ICD-10-CM | POA: Diagnosis not present

## 2023-11-09 ENCOUNTER — Ambulatory Visit: Payer: Medicare Other | Admitting: Cardiology

## 2023-11-10 LAB — CUP PACEART REMOTE DEVICE CHECK
Battery Remaining Longevity: 41 mo
Battery Remaining Percentage: 50 %
Battery Voltage: 2.98 V
Brady Statistic AP VP Percent: 98 %
Brady Statistic AP VS Percent: 1 %
Brady Statistic AS VP Percent: 1.1 %
Brady Statistic AS VS Percent: 1 %
Brady Statistic RA Percent Paced: 98 %
Date Time Interrogation Session: 20250305034202
Implantable Lead Connection Status: 753985
Implantable Lead Connection Status: 753985
Implantable Lead Connection Status: 753985
Implantable Lead Implant Date: 20210607
Implantable Lead Implant Date: 20210607
Implantable Lead Implant Date: 20210607
Implantable Lead Location: 753858
Implantable Lead Location: 753859
Implantable Lead Location: 753860
Implantable Pulse Generator Implant Date: 20210607
Lead Channel Impedance Value: 440 Ohm
Lead Channel Impedance Value: 480 Ohm
Lead Channel Impedance Value: 830 Ohm
Lead Channel Pacing Threshold Amplitude: 0.75 V
Lead Channel Pacing Threshold Amplitude: 0.75 V
Lead Channel Pacing Threshold Amplitude: 1 V
Lead Channel Pacing Threshold Pulse Width: 0.5 ms
Lead Channel Pacing Threshold Pulse Width: 0.5 ms
Lead Channel Pacing Threshold Pulse Width: 0.5 ms
Lead Channel Sensing Intrinsic Amplitude: 3.7 mV
Lead Channel Sensing Intrinsic Amplitude: 9.1 mV
Lead Channel Setting Pacing Amplitude: 2 V
Lead Channel Setting Pacing Amplitude: 2 V
Lead Channel Setting Pacing Amplitude: 2 V
Lead Channel Setting Pacing Pulse Width: 0.5 ms
Lead Channel Setting Pacing Pulse Width: 0.5 ms
Lead Channel Setting Sensing Sensitivity: 2 mV
Pulse Gen Model: 3562
Pulse Gen Serial Number: 3818273

## 2023-12-26 ENCOUNTER — Other Ambulatory Visit: Payer: Self-pay | Admitting: Cardiology

## 2023-12-26 NOTE — Addendum Note (Signed)
 Addended by: Lott Rouleau A on: 12/26/2023 01:07 PM   Modules accepted: Orders

## 2023-12-26 NOTE — Progress Notes (Signed)
 Remote pacemaker transmission.

## 2024-02-07 ENCOUNTER — Ambulatory Visit (INDEPENDENT_AMBULATORY_CARE_PROVIDER_SITE_OTHER): Payer: Medicare Other

## 2024-02-07 DIAGNOSIS — I420 Dilated cardiomyopathy: Secondary | ICD-10-CM | POA: Diagnosis not present

## 2024-02-07 LAB — CUP PACEART REMOTE DEVICE CHECK
Battery Remaining Longevity: 37 mo
Battery Remaining Percentage: 46 %
Battery Voltage: 2.96 V
Brady Statistic AP VP Percent: 98 %
Brady Statistic AP VS Percent: 1 %
Brady Statistic AS VP Percent: 1 %
Brady Statistic AS VS Percent: 1 %
Brady Statistic RA Percent Paced: 98 %
Date Time Interrogation Session: 20250604020024
Implantable Lead Connection Status: 753985
Implantable Lead Connection Status: 753985
Implantable Lead Connection Status: 753985
Implantable Lead Implant Date: 20210607
Implantable Lead Implant Date: 20210607
Implantable Lead Implant Date: 20210607
Implantable Lead Location: 753858
Implantable Lead Location: 753859
Implantable Lead Location: 753860
Implantable Pulse Generator Implant Date: 20210607
Lead Channel Impedance Value: 480 Ohm
Lead Channel Impedance Value: 480 Ohm
Lead Channel Impedance Value: 840 Ohm
Lead Channel Pacing Threshold Amplitude: 0.75 V
Lead Channel Pacing Threshold Amplitude: 0.875 V
Lead Channel Pacing Threshold Amplitude: 1 V
Lead Channel Pacing Threshold Pulse Width: 0.5 ms
Lead Channel Pacing Threshold Pulse Width: 0.5 ms
Lead Channel Pacing Threshold Pulse Width: 0.5 ms
Lead Channel Sensing Intrinsic Amplitude: 3.9 mV
Lead Channel Sensing Intrinsic Amplitude: 8.1 mV
Lead Channel Setting Pacing Amplitude: 2 V
Lead Channel Setting Pacing Amplitude: 2 V
Lead Channel Setting Pacing Amplitude: 2 V
Lead Channel Setting Pacing Pulse Width: 0.5 ms
Lead Channel Setting Pacing Pulse Width: 0.5 ms
Lead Channel Setting Sensing Sensitivity: 2 mV
Pulse Gen Model: 3562
Pulse Gen Serial Number: 3818273

## 2024-02-09 ENCOUNTER — Ambulatory Visit: Payer: Medicare Other | Admitting: Urology

## 2024-02-12 ENCOUNTER — Encounter: Payer: Self-pay | Admitting: Cardiology

## 2024-02-12 ENCOUNTER — Ambulatory Visit: Payer: Medicare Other | Attending: Cardiology | Admitting: Cardiology

## 2024-02-12 VITALS — BP 140/76 | HR 74 | Ht 68.0 in | Wt 176.0 lb

## 2024-02-12 DIAGNOSIS — I493 Ventricular premature depolarization: Secondary | ICD-10-CM | POA: Diagnosis not present

## 2024-02-12 DIAGNOSIS — I2510 Atherosclerotic heart disease of native coronary artery without angina pectoris: Secondary | ICD-10-CM | POA: Insufficient documentation

## 2024-02-12 DIAGNOSIS — I5022 Chronic systolic (congestive) heart failure: Secondary | ICD-10-CM | POA: Diagnosis not present

## 2024-02-12 NOTE — Progress Notes (Signed)
  Electrophysiology Office Note:   Date:  02/12/2024  ID:  [name], [name] 12/29/1940, MRN [id number]  Primary Cardiologist: Randene Bustard, MD Primary Heart Failure: None Electrophysiologist: Kamorah Nevils Cortland Ding, MD      History of Present Illness:   [name] is a 83 y.o. male with h/o coronary disease post CABG, PVCs, CHF,  seen today for routine electrophysiology followup.   Since last being seen in our clinic the patient reports doing well.  He has no chest pain or shortness of breath.  He does not do his daily activities.  Feels much improved with increased heart rate and reduced PVC burden.  He does not have any fatigue or weakness.  He is able to do more than his daily activities with less PVCs and improved pacing percentage.  he denies chest pain, palpitations, dyspnea, PND, orthopnea, nausea, vomiting, dizziness, syncope, edema, weight gain, or early satiety.   Review of systems complete and found to be negative unless listed in HPI.      EP Information / Studies Reviewed:    EKG is not ordered today. EKG from 10/10/23 reviewed which showed AV paced      PPM Interrogation-  reviewed in detail today,  See PACEART report.  Device History: Abbott BiV PPM implanted 02/10/2020 for chronic systolic heart failure  Risk Assessment/Calculations:           Physical Exam:   VS:  There were no vitals taken for this visit.   Wt Readings from Last 3 Encounters:  10/10/23 181 lb 12.8 oz (82.5 kg)  02/13/23 180 lb (81.6 kg)  01/24/23 177 lb (80.3 kg)     GEN: Well nourished, well developed in no acute distress NECK: No JVD; No carotid bruits CARDIAC: Regular rate and rhythm, no murmurs, rubs, gallops RESPIRATORY:  Clear to auscultation without rales, wheezing or rhonchi  ABDOMEN: Soft, non-tender, non-distended EXTREMITIES:  No edema; No deformity   ASSESSMENT AND PLAN:    Chronic systolic heart failure s/p Abbott PPM  Normal PPM function See Pace Art report Sensing,  threshold, impedance within normal limits Programming reviewed and appropriate No changes today  2.  PVCs: Elevated burden.  Pacemaker rate has been increased with improvement in PVC burden of less than 1%.  Continue current management.  3.  Coronary artery disease: Post CABG and PCI.  Plan per primary cardiology.  Disposition:   Follow up with EP APP in 12 months  Signed, Charlese Gruetzmacher Cortland Ding, MD

## 2024-03-01 DIAGNOSIS — E782 Mixed hyperlipidemia: Secondary | ICD-10-CM | POA: Diagnosis not present

## 2024-03-01 DIAGNOSIS — R7303 Prediabetes: Secondary | ICD-10-CM | POA: Diagnosis not present

## 2024-03-02 LAB — LAB REPORT - SCANNED
A1c: 6.1
Albumin, Urine POC: 16.2
Albumin/Creatinine Ratio, Urine, POC: 13
Creatinine, POC: 123.8 mg/dL
EGFR: 76

## 2024-03-12 DIAGNOSIS — Z860100 Personal history of colon polyps, unspecified: Secondary | ICD-10-CM | POA: Diagnosis not present

## 2024-03-12 DIAGNOSIS — I10 Essential (primary) hypertension: Secondary | ICD-10-CM | POA: Diagnosis not present

## 2024-03-12 DIAGNOSIS — E291 Testicular hypofunction: Secondary | ICD-10-CM | POA: Diagnosis not present

## 2024-03-12 DIAGNOSIS — I420 Dilated cardiomyopathy: Secondary | ICD-10-CM | POA: Diagnosis not present

## 2024-03-12 DIAGNOSIS — N200 Calculus of kidney: Secondary | ICD-10-CM | POA: Diagnosis not present

## 2024-03-12 DIAGNOSIS — Z950 Presence of cardiac pacemaker: Secondary | ICD-10-CM | POA: Diagnosis not present

## 2024-03-12 DIAGNOSIS — J4540 Moderate persistent asthma, uncomplicated: Secondary | ICD-10-CM | POA: Diagnosis not present

## 2024-03-12 DIAGNOSIS — R978 Other abnormal tumor markers: Secondary | ICD-10-CM | POA: Diagnosis not present

## 2024-03-12 DIAGNOSIS — R7303 Prediabetes: Secondary | ICD-10-CM | POA: Diagnosis not present

## 2024-03-12 DIAGNOSIS — I5042 Chronic combined systolic (congestive) and diastolic (congestive) heart failure: Secondary | ICD-10-CM | POA: Diagnosis not present

## 2024-03-12 DIAGNOSIS — M545 Low back pain, unspecified: Secondary | ICD-10-CM | POA: Diagnosis not present

## 2024-03-12 DIAGNOSIS — E782 Mixed hyperlipidemia: Secondary | ICD-10-CM | POA: Diagnosis not present

## 2024-03-22 ENCOUNTER — Encounter: Payer: Self-pay | Admitting: Advanced Practice Midwife

## 2024-04-01 NOTE — Progress Notes (Signed)
 Remote pacemaker transmission.

## 2024-04-08 ENCOUNTER — Encounter: Payer: Self-pay | Admitting: Cardiology

## 2024-04-08 ENCOUNTER — Ambulatory Visit: Attending: Cardiology | Admitting: Cardiology

## 2024-04-08 VITALS — BP 110/58 | HR 80 | Ht 68.0 in | Wt 184.0 lb

## 2024-04-08 DIAGNOSIS — E785 Hyperlipidemia, unspecified: Secondary | ICD-10-CM | POA: Diagnosis not present

## 2024-04-08 DIAGNOSIS — I447 Left bundle-branch block, unspecified: Secondary | ICD-10-CM | POA: Diagnosis not present

## 2024-04-08 DIAGNOSIS — T466X5A Adverse effect of antihyperlipidemic and antiarteriosclerotic drugs, initial encounter: Secondary | ICD-10-CM | POA: Insufficient documentation

## 2024-04-08 DIAGNOSIS — Z9861 Coronary angioplasty status: Secondary | ICD-10-CM | POA: Diagnosis not present

## 2024-04-08 DIAGNOSIS — I25708 Atherosclerosis of coronary artery bypass graft(s), unspecified, with other forms of angina pectoris: Secondary | ICD-10-CM | POA: Insufficient documentation

## 2024-04-08 DIAGNOSIS — I5042 Chronic combined systolic (congestive) and diastolic (congestive) heart failure: Secondary | ICD-10-CM | POA: Diagnosis not present

## 2024-04-08 DIAGNOSIS — M791 Myalgia, unspecified site: Secondary | ICD-10-CM | POA: Insufficient documentation

## 2024-04-08 DIAGNOSIS — I420 Dilated cardiomyopathy: Secondary | ICD-10-CM | POA: Insufficient documentation

## 2024-04-08 DIAGNOSIS — I493 Ventricular premature depolarization: Secondary | ICD-10-CM | POA: Diagnosis not present

## 2024-04-08 DIAGNOSIS — T466X5D Adverse effect of antihyperlipidemic and antiarteriosclerotic drugs, subsequent encounter: Secondary | ICD-10-CM | POA: Diagnosis not present

## 2024-04-08 DIAGNOSIS — I2510 Atherosclerotic heart disease of native coronary artery without angina pectoris: Secondary | ICD-10-CM | POA: Diagnosis not present

## 2024-04-08 DIAGNOSIS — I951 Orthostatic hypotension: Secondary | ICD-10-CM | POA: Diagnosis not present

## 2024-04-08 MED ORDER — SPIRONOLACTONE 25 MG PO TABS: 12.5000 mg | ORAL_TABLET | ORAL | Status: AC

## 2024-04-08 MED ORDER — FUROSEMIDE 20 MG PO TABS: 20.0000 mg | ORAL_TABLET | ORAL | Status: AC

## 2024-04-08 NOTE — Progress Notes (Signed)
 Cardiology Office Note:  .   Date:  04/14/2024  ID:  [name], [name] August 03, 1941, MRN [id number] PCP: Shona Norleen PEDLAR, MD  [facility] Providers Cardiologist:  Alm Clay, MD Electrophysiologist:  Will Gladis Norton, MD     Chief Complaint  Patient presents with   Follow-up    6 months.  Doing well   Coronary Artery Disease    No angina   Palpitations    Notably improved   Cardiomyopathy    Echocardiogram results reviewed-notable improvement in EF.  Also symptomatically improved.    Patient Profile: [name] is a  83 y.o. male  with a PMH below who presents here for routine follow-up at the request of Shona Norleen PEDLAR, MD.  PMH notable for CAD-CABG-PCI, HTN, HLD and LBBB with ICM-chronic HFrEF (EF 30 to 35% thought to be related to LBBB with elevated PVC burden.) s/p CRT-P (June 2021).  Pertinent PMH CAD->CABG 07/2004 (Sx = fatigue & PVCs) --> CATH -> MV CAD --> CABG X 4 (RIMA-LAD, LIMA-Diag, SVG-OM, SVG-RCA) 04/2011 - recurrent Sx -> Myoview + for Inf Ischemia --> Cath => Only RIMA-LAD patent --> DES PCI of Native Cx-OM (2 overlapping Promus DES) & Native RCA June 2016 - DES PCI of the dRCA-RPAV. Since then he has done very well with no active anginal symptoms. COMBINED SYSTOLIC/DIASTOLIC CHF / ISCHEMIC-NON-ISCHEMIC CM 2/2 LBBB 12/2017 - LBBB--> ECHO EF ~35% -> Cath 01/2018 w/ patent stents & RIMA-LAD. EF ~35-40%. Normal RHC (PCWP 12 mmHg, LVEDP 14 mmHg) 06/2019 - Echo EF down to 30-35% - referred to Dr. Norton for CRT (P vs. D) -> delayed in order toOPTIMIZE Med  Rx  -- attemtped to place on Carvedilol  & Kyle (has never been able totoalerate any meds - BB or ARB in the past)       CRT-P (not Defib b/c felt that with CRT, EF could improve) -St Jude BiVPPM => nominal improvement in E level  Following CRT-P-- he stopped taking Entresto  & was converted to Bisoprolol  2.5 mg.       I last saw Yogesh A Lana on June 08, 2024: Noted occasional dyspnea on exertion with  some palpitations but much better on adjustments to his PPM.  Energy level improved with adjustments to PPM rate.  Felt to be NYHA class IIa symptoms. - Restarted atorvastatin  10 mg daily due to lipid concerns.  Remained on aspirin  81 mg, bisoprolol  5 mg and spironolactone  12.5 mg daily.  Had stopped the combination metoprolol-HCTZ because of low blood pressure.   - 2D echo ordered  He was recently seen on 02/12/2024 by Dr. Norton: Denied any chest pain or pressure.  No dyspnea.  Noted significant exercise tolerance with increased heart rate on PPM.  Reduced PVC burden.  Fatigue notably improved -> able to do activities without any difficulty.  No angina or heart failure.  Subjective  Discussed the use of AI scribe software for clinical note transcription with the patient, who gave verbal consent to proceed.  History of Present Illness  [name] is an 83 year old male with heart failure and defibrillator placement who presents for follow-up regarding his cardiac function and medication management.  He has experienced significant improvement in his symptoms since the adjustment of his defibrillator settings. Initially, the device was set to pace at 60 beats per minute, which he felt was inadequate. After increasing the pacing rate to 75 beats per minute, he noticed a marked improvement  in his energy levels and a reduction in premature ventricular contractions (PVCs).  He recalls that his ejection fraction was reported as having increased from 30-35% to 40-45%. His blood pressure, which used to be in the high 150s to low 160s, is now consistently below 130, often around 110/50.  Regarding his medication regimen, he takes bisoprolol  5 mg at night, atorvastatin  10 mg, and aspirin  81 mg daily. He also takes spironolactone  three times a week and uses furosemide  as needed when he feels 'a little puffy,' typically about twice a week. He has stopped taking bisoprolol  with HCTZ in the morning due to low  blood pressure readings.  Cardiovascular ROS: positive for - Trivial edema.  Significantly improved exercise tolerance/less fatigue negative for - chest pain, dyspnea on exertion, irregular heartbeat, orthopnea, palpitations, paroxysmal nocturnal dyspnea, rapid heart rate, shortness of breath, or syncope or near syncope, TIA oramaurosis fugax.  Claudication.   ROS:  Review of Systems - Negative except symptoms noted above.  No melena, hematochezia, hematuria or epistaxis.    Objective   Medications - Bisoprolol /hydrochlorothiazide  (no longer taking) - Spironolactone  (three times a week) - Lasix  (furosemide , 15 or 20 mg, as needed)-has not used very much. - Bisoprolol  5 mg (at night) - Atorvastatin  (10 mg) - Aspirin  (81 mg) - Symbicort 2 puffs nightly and every morning  - Rare as needed Xanax  for anxiety or tremors 0.5 mg - As needed Parafon   500 mg.  Muscle spasm.  Studies Reviewed: [name]        Labs from PCP reviewed: TC 200, TG 93, HDL 139, LDL 44.  BUN 19, Cr 0.98, K+ 5.1  ECHO: EF improved to roughly 40-45%.  Mildly decreased function global HK.  GR 1 DD.  RV function mildly reduced with normal PAP.  Mild AV calcification otherwise normal valves.  (10/30/2023)  Most recent Right-Left Heart Cath With Native Coronary and Graft Angiography: Moderate severe proximal LAD with patent RIMA-LAD.  Occluded LIMA-LAD, SVG-OM and SVG-RPDA with widely patent RCA and LCx stents.  Normal RHC numbers.  (01/09/2018)    Risk Assessment/Calculations:           Physical Exam:   VS:  BP (!) 110/58   Pulse 80   Ht 5' 8 (1.727 m)   Wt 184 lb (83.5 kg)   SpO2 95%   BMI 27.98 kg/m    Wt Readings from Last 3 Encounters:  04/08/24 184 lb (83.5 kg)  02/12/24 176 lb (79.8 kg)  10/10/23 181 lb 12.8 oz (82.5 kg)    GEN: Well nourished, well groomed; in no acute distress; healthy appearing NECK: No JVD; No carotid bruits CARDIAC: Normal S1, S2; RRR, no murmurs, rubs, gallops RESPIRATORY:  Clear to  auscultation without rales, wheezing or rhonchi ; nonlabored, good air movement. ABDOMEN: Soft, non-tender, non-distended EXTREMITIES:  trivial ankle edema; No deformity      ASSESSMENT AND PLAN: .    Problem List Items Addressed This Visit       Cardiology Problems   CAD (coronary artery disease) of artery bypass graft -atretic LIMA- circumflex, occluded SVG-RCA and SVG-diagonal. (Chronic)   Relevant Medications   furosemide  (LASIX ) 20 MG tablet   spironolactone  (ALDACTONE ) 25 MG tablet   CAD S/P  PCI of Cx-OM w/ 2 Resolute DES 3.0 x 15 & 3.0 x 18 (3.44mm); PCI-pRCA: Promus Element DES 3.0 x 38 (3.76mm); dRCA-rPAV: Promus Premier DES 2.75x28 (3 mm) - Primary (Chronic)   Doing well now with no active anginal symptoms. Echo  with no new WMA.  This would suggest patent RIMA graft along with native coronary graft. GDMT limited by medical compliance and intolerance. Continue 81 mg aspirin  daily for SAPT maintenance Continue 10 mg Lipitor (unfortunately lipids went the wrong direction) -> has been intolerant of other medications and not willing to entertain other options Continue low-dose bisoprolol  5 mg which I think he is only taking once a day.       Relevant Medications   furosemide  (LASIX ) 20 MG tablet   spironolactone  (ALDACTONE ) 25 MG tablet   Chronic combined systolic and diastolic heart failure (HCC) (Chronic)   Heart failure with reduced ejection fraction-most likely related to left bundle branch block Improved with left ventricular pacing. Ejection fraction increased to 40-45%.  Reports improved energy and decreased blood pressure.  Occasional PVCs but overall symptom improvement.  GDMT has been very difficult due to medication tolerance, hypotension and the patient's tendency to adjust his other medications. For now continue bisoprolol  5 mg (daily because of low blood pressures)-take at night Take spironolactone  three times a week. Use furosemide  as needed, suggested two  days a week. Consider alternating spironolactone  and furosemide .       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 Now with increased BiV pacing, QRS has narrowed and EF has improved consistent with improved symptomatology.  Pacemaker battery life approximately three years. - Continue pacemaker settings at 75 bpm. - Monitor pacemaker battery life.      Relevant Medications   furosemide  (LASIX ) 20 MG tablet   spironolactone  (ALDACTONE ) 25 MG tablet   Dilated cardiomyopathy (HCC): EF ~35%, new LBBB - no change to Coronary Anatony on Cath (Chronic)   Notable improvement in EF now 40 to 45% when previously 30 to 35%-this was following adjustment of medical management and heart rate increase to improve LV pacing.  Notably improved exercise tolerance. Now NYHA class II symptoms      Relevant Medications   furosemide  (LASIX ) 20 MG tablet   spironolactone  (ALDACTONE ) 25 MG tablet   Hyperlipidemia with target LDL less than 70 (Chronic)   Longstanding condition with suboptimal cholesterol levels. Previous LDL was 225 mg/dL. On atorvastatin  10 mg daily. - Continue atorvastatin  10 mg daily.  Limited by statin myalgia as noted. If LDL continue to be elevated, will consider adding bempedoic acid. Discussed dietary adjustment.      Relevant Medications   furosemide  (LASIX ) 20 MG tablet   spironolactone  (ALDACTONE ) 25 MG tablet   Orthostatic hypotension - with tremor (Chronic)   Labile blood pressures.  More prone to hypotension.  Avoiding aggressive BP medications due to intolerance.  He is now on taking bisoprolol  5 mg every afternoon Judicious use of spironolactone  and furosemide       Relevant Medications   furosemide  (LASIX ) 20 MG tablet   spironolactone  (ALDACTONE ) 25 MG tablet   PVC's (premature  ventricular contractions) (Chronic)   Symptomatically improved with increased rate on pacemaker and continuation of low-dose beta-blocker.      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  Seems to be tolerating low-dose atorvastatin  but not necessarily willing to push the dose further. Did  not tolerate Zetia .  Not interested in SGLT2 meters or Leqvio. If LDL continues to be elevated, we could consider adding bempedoic acid.          Follow-Up: Return in about 6 months (around 10/09/2024) for 6 month follow-up with me, [employer].     Signed, Alm MICAEL Clay, MD, MS Alm Clay, M.D., M.S. Interventional Chartered certified accountant  Pager # [phone]

## 2024-04-08 NOTE — Patient Instructions (Addendum)
 Medication Instructions:   Start  Spironolactone   3 days a week ( suggest  M-W-F)    Hen take Furosmide  2 days a week   ( suggestTues-Thurs)  *If you need a refill on your cardiac medications before your next appointment, please call your pharmacy*   Lab Work:  Not needed.   Testing/Procedures:  Not needed  Follow-Up: At [facility], you and your health needs are our priority.  As part of our continuing mission to provide you with exceptional heart care, we have created designated Provider Care Teams.  These Care Teams include your primary Cardiologist (physician) and Advanced Practice Providers (APPs -  Physician Assistants and Nurse Practitioners) who all work together to provide you with the care you need, when you need it.     Your next appointment:   6 month(s)  The format for your next appointment:   In Person  Provider:   Alm Clay, MD   Other Instructions

## 2024-04-14 ENCOUNTER — Encounter: Payer: Self-pay | Admitting: Cardiology

## 2024-04-14 NOTE — Assessment & Plan Note (Signed)
 Labile blood pressures.  More prone to hypotension.  Avoiding aggressive BP medications due to intolerance.  He is now on taking bisoprolol  5 mg every afternoon Judicious use of spironolactone  and furosemide 

## 2024-04-14 NOTE — Assessment & Plan Note (Signed)
 He has failed rosuvastatin simvastatin  and Vytorin  as well as pravastatin.  Seems to be tolerating low-dose atorvastatin  but not necessarily willing to push the dose further. Did not tolerate Zetia .  Not interested in SGLT2 meters or Leqvio. If LDL continues to be elevated, we could consider adding bempedoic acid.

## 2024-04-14 NOTE — Assessment & Plan Note (Signed)
 Longstanding condition with suboptimal cholesterol levels. Previous LDL was 225 mg/dL. On atorvastatin  10 mg daily. - Continue atorvastatin  10 mg daily.  Limited by statin myalgia as noted. If LDL continue to be elevated, will consider adding bempedoic acid. Discussed dietary adjustment.

## 2024-04-14 NOTE — Assessment & Plan Note (Addendum)
 Heart failure with reduced ejection fraction-most likely related to left bundle branch block Improved with left ventricular pacing. Ejection fraction increased to 40-45%.  Reports improved energy and decreased blood pressure.  Occasional PVCs but overall symptom improvement.  GDMT has been very difficult due to medication tolerance, hypotension and the patient's tendency to adjust his other medications. For now continue bisoprolol  5 mg (daily because of low blood pressures)-take at night Take spironolactone  three times a week. Use furosemide  as needed, suggested two days a week. Consider alternating spironolactone  and furosemide .

## 2024-04-14 NOTE — Assessment & Plan Note (Signed)
 Notable improvement in EF now 40 to 45% when previously 30 to 35%-this was following adjustment of medical management and heart rate increase to improve LV pacing.  Notably improved exercise tolerance. Now NYHA class II symptoms

## 2024-04-14 NOTE — Assessment & Plan Note (Signed)
 Symptomatically improved with increased rate on pacemaker and continuation of low-dose beta-blocker.

## 2024-04-14 NOTE — Assessment & Plan Note (Signed)
 Doing well now with no active anginal symptoms. Echo with no new WMA.  This would suggest patent RIMA graft along with native coronary graft. GDMT limited by medical compliance and intolerance. Continue 81 mg aspirin  daily for SAPT maintenance Continue 10 mg Lipitor (unfortunately lipids went the wrong direction) -> has been intolerant of other medications and not willing to entertain other options Continue low-dose bisoprolol  5 mg which I think he is only taking once a day.

## 2024-04-14 NOTE — Assessment & Plan Note (Addendum)
 Status post BiV PPM placement for presumed LBBB related cardiomyopathy. Now with increased BiV pacing, QRS has narrowed and EF has improved consistent with improved symptomatology.  Pacemaker battery life approximately three years. - Continue pacemaker settings at 75 bpm. - Monitor pacemaker battery life.

## 2024-05-07 DIAGNOSIS — L578 Other skin changes due to chronic exposure to nonionizing radiation: Secondary | ICD-10-CM | POA: Diagnosis not present

## 2024-05-07 DIAGNOSIS — L570 Actinic keratosis: Secondary | ICD-10-CM | POA: Diagnosis not present

## 2024-05-07 DIAGNOSIS — L219 Seborrheic dermatitis, unspecified: Secondary | ICD-10-CM | POA: Diagnosis not present

## 2024-05-07 DIAGNOSIS — L821 Other seborrheic keratosis: Secondary | ICD-10-CM | POA: Diagnosis not present

## 2024-05-07 DIAGNOSIS — L814 Other melanin hyperpigmentation: Secondary | ICD-10-CM | POA: Diagnosis not present

## 2024-05-08 ENCOUNTER — Ambulatory Visit (INDEPENDENT_AMBULATORY_CARE_PROVIDER_SITE_OTHER)

## 2024-05-08 DIAGNOSIS — I447 Left bundle-branch block, unspecified: Secondary | ICD-10-CM | POA: Diagnosis not present

## 2024-05-09 LAB — CUP PACEART REMOTE DEVICE CHECK
Battery Remaining Longevity: 34 mo
Battery Remaining Percentage: 42 %
Battery Voltage: 2.96 V
Brady Statistic AP VP Percent: 98 %
Brady Statistic AP VS Percent: 1 %
Brady Statistic AS VP Percent: 1 %
Brady Statistic AS VS Percent: 1 %
Brady Statistic RA Percent Paced: 98 %
Date Time Interrogation Session: 20250903020009
Implantable Lead Connection Status: 753985
Implantable Lead Connection Status: 753985
Implantable Lead Connection Status: 753985
Implantable Lead Implant Date: 20210607
Implantable Lead Implant Date: 20210607
Implantable Lead Implant Date: 20210607
Implantable Lead Location: 753858
Implantable Lead Location: 753859
Implantable Lead Location: 753860
Implantable Pulse Generator Implant Date: 20210607
Lead Channel Impedance Value: 450 Ohm
Lead Channel Impedance Value: 450 Ohm
Lead Channel Impedance Value: 890 Ohm
Lead Channel Pacing Threshold Amplitude: 0.75 V
Lead Channel Pacing Threshold Amplitude: 0.875 V
Lead Channel Pacing Threshold Amplitude: 0.875 V
Lead Channel Pacing Threshold Pulse Width: 0.5 ms
Lead Channel Pacing Threshold Pulse Width: 0.5 ms
Lead Channel Pacing Threshold Pulse Width: 0.5 ms
Lead Channel Sensing Intrinsic Amplitude: 4.5 mV
Lead Channel Sensing Intrinsic Amplitude: 8.1 mV
Lead Channel Setting Pacing Amplitude: 2 V
Lead Channel Setting Pacing Amplitude: 2 V
Lead Channel Setting Pacing Amplitude: 2 V
Lead Channel Setting Pacing Pulse Width: 0.5 ms
Lead Channel Setting Pacing Pulse Width: 0.5 ms
Lead Channel Setting Sensing Sensitivity: 2 mV
Pulse Gen Model: 3562
Pulse Gen Serial Number: 3818273

## 2024-05-10 ENCOUNTER — Ambulatory Visit: Payer: Self-pay | Admitting: Cardiology

## 2024-05-15 NOTE — Progress Notes (Signed)
 Remote PPM Transmission

## 2024-05-18 DIAGNOSIS — Z23 Encounter for immunization: Secondary | ICD-10-CM | POA: Diagnosis not present

## 2024-07-29 ENCOUNTER — Ambulatory Visit (HOSPITAL_COMMUNITY)
Admission: RE | Admit: 2024-07-29 | Discharge: 2024-07-29 | Disposition: A | Discharge status: Home / Self Care | Payer: Medicare Other | Source: Ambulatory Visit | Attending: Urology | Admitting: Urology

## 2024-07-29 DIAGNOSIS — N200 Calculus of kidney: Secondary | ICD-10-CM | POA: Insufficient documentation

## 2024-07-29 DIAGNOSIS — N281 Cyst of kidney, acquired: Secondary | ICD-10-CM | POA: Diagnosis not present

## 2024-08-07 ENCOUNTER — Ambulatory Visit

## 2024-08-07 DIAGNOSIS — I420 Dilated cardiomyopathy: Secondary | ICD-10-CM | POA: Diagnosis not present

## 2024-08-07 DIAGNOSIS — Z23 Encounter for immunization: Secondary | ICD-10-CM | POA: Diagnosis not present

## 2024-08-08 ENCOUNTER — Ambulatory Visit: Payer: Self-pay | Admitting: Cardiology

## 2024-08-08 LAB — CUP PACEART REMOTE DEVICE CHECK
Battery Remaining Longevity: 31 mo
Battery Remaining Percentage: 38 %
Battery Voltage: 2.96 V
Brady Statistic AP VP Percent: 98 %
Brady Statistic AP VS Percent: 1 %
Brady Statistic AS VP Percent: 1 %
Brady Statistic AS VS Percent: 1 %
Brady Statistic RA Percent Paced: 97 %
Date Time Interrogation Session: 20251203020009
Implantable Lead Connection Status: 753985
Implantable Lead Connection Status: 753985
Implantable Lead Connection Status: 753985
Implantable Lead Implant Date: 20210607
Implantable Lead Implant Date: 20210607
Implantable Lead Implant Date: 20210607
Implantable Lead Location: 753858
Implantable Lead Location: 753859
Implantable Lead Location: 753860
Implantable Pulse Generator Implant Date: 20210607
Lead Channel Impedance Value: 460 Ohm
Lead Channel Impedance Value: 480 Ohm
Lead Channel Impedance Value: 800 Ohm
Lead Channel Pacing Threshold Amplitude: 0.75 V
Lead Channel Pacing Threshold Amplitude: 0.875 V
Lead Channel Pacing Threshold Amplitude: 1 V
Lead Channel Pacing Threshold Pulse Width: 0.5 ms
Lead Channel Pacing Threshold Pulse Width: 0.5 ms
Lead Channel Pacing Threshold Pulse Width: 0.5 ms
Lead Channel Sensing Intrinsic Amplitude: 3.4 mV
Lead Channel Sensing Intrinsic Amplitude: 7.4 mV
Lead Channel Setting Pacing Amplitude: 2 V
Lead Channel Setting Pacing Amplitude: 2 V
Lead Channel Setting Pacing Amplitude: 2 V
Lead Channel Setting Pacing Pulse Width: 0.5 ms
Lead Channel Setting Pacing Pulse Width: 0.5 ms
Lead Channel Setting Sensing Sensitivity: 2 mV
Pulse Gen Model: 3562
Pulse Gen Serial Number: 3818273

## 2024-08-12 ENCOUNTER — Ambulatory Visit: Payer: Medicare Other | Admitting: Urology

## 2024-08-12 DIAGNOSIS — N200 Calculus of kidney: Secondary | ICD-10-CM

## 2024-08-13 ENCOUNTER — Telehealth: Payer: Self-pay

## 2024-08-13 NOTE — Progress Notes (Signed)
 Remote PPM Transmission

## 2024-08-19 NOTE — Telephone Encounter (Signed)
 Renal us  reviewed with Dr. Sherrilee. Patient called and made aware renal us  results are stable and per Dr. Sherrilee patient can f/u in 1 yr with renal ultrasound. Order placed, appointment made, and mailed to pt.

## 2024-09-13 LAB — LAB REPORT - SCANNED
A1c: 6.7
Albumin, Urine POC: 17.6
Albumin/Creatinine Ratio, Urine, POC: 10
Creatinine, POC: 173.6 mg/dL
EGFR: 75

## 2024-09-19 ENCOUNTER — Ambulatory Visit: Payer: Self-pay | Admitting: Cardiology

## 2024-11-06 ENCOUNTER — Ambulatory Visit

## 2024-12-18 ENCOUNTER — Ambulatory Visit: Admitting: Cardiology

## 2025-02-05 ENCOUNTER — Ambulatory Visit

## 2025-05-07 ENCOUNTER — Ambulatory Visit

## 2025-07-28 ENCOUNTER — Other Ambulatory Visit (HOSPITAL_COMMUNITY)

## 2025-08-06 ENCOUNTER — Ambulatory Visit: Admitting: Urology
